# Patient Record
Sex: Female | Born: 1980 | Race: Black or African American | Hispanic: No | Marital: Married | Smoking: Never smoker
Health system: Southern US, Community
[De-identification: ages and names within clinical notes are randomized; demographics above are authoritative.]

## PROBLEM LIST (undated history)

## (undated) DIAGNOSIS — R5383 Other fatigue: Secondary | ICD-10-CM

## (undated) DIAGNOSIS — D693 Immune thrombocytopenic purpura: Secondary | ICD-10-CM

## (undated) DIAGNOSIS — Z23 Encounter for immunization: Secondary | ICD-10-CM

## (undated) HISTORY — DX: Other fatigue: R53.83

## (undated) HISTORY — DX: Immune thrombocytopenic purpura: D69.3

## (undated) HISTORY — DX: Encounter for immunization: Z23

---

## 2003-06-23 ENCOUNTER — Encounter: Payer: Self-pay | Admitting: Gastroenterology

## 2003-06-23 ENCOUNTER — Encounter: Admission: RE | Admit: 2003-06-23 | Discharge: 2003-06-23 | Payer: Self-pay | Admitting: Gastroenterology

## 2004-01-13 ENCOUNTER — Encounter: Admission: RE | Admit: 2004-01-13 | Discharge: 2004-01-13 | Payer: Self-pay | Admitting: Family Medicine

## 2004-10-07 ENCOUNTER — Inpatient Hospital Stay (HOSPITAL_COMMUNITY): Admission: EM | Admit: 2004-10-07 | Discharge: 2004-10-11 | Payer: Self-pay | Admitting: Emergency Medicine

## 2004-10-07 ENCOUNTER — Ambulatory Visit: Payer: Self-pay | Admitting: Oncology

## 2004-10-10 ENCOUNTER — Ambulatory Visit: Payer: Self-pay | Admitting: Oncology

## 2004-11-17 ENCOUNTER — Ambulatory Visit (HOSPITAL_COMMUNITY): Admission: RE | Admit: 2004-11-17 | Discharge: 2004-11-17 | Payer: Self-pay | Admitting: Oncology

## 2004-11-27 HISTORY — PX: OTHER SURGICAL HISTORY: SHX169

## 2004-11-30 ENCOUNTER — Ambulatory Visit: Payer: Self-pay | Admitting: Oncology

## 2004-12-14 ENCOUNTER — Ambulatory Visit: Admission: RE | Admit: 2004-12-14 | Discharge: 2004-12-14 | Payer: Self-pay | Admitting: *Deleted

## 2005-01-17 ENCOUNTER — Ambulatory Visit: Payer: Self-pay | Admitting: Oncology

## 2005-02-20 ENCOUNTER — Encounter (INDEPENDENT_AMBULATORY_CARE_PROVIDER_SITE_OTHER): Payer: Self-pay | Admitting: *Deleted

## 2005-02-20 ENCOUNTER — Observation Stay (HOSPITAL_COMMUNITY): Admission: RE | Admit: 2005-02-20 | Discharge: 2005-02-21 | Payer: Self-pay | Admitting: *Deleted

## 2005-03-06 ENCOUNTER — Ambulatory Visit: Payer: Self-pay | Admitting: Oncology

## 2005-03-16 ENCOUNTER — Observation Stay (HOSPITAL_COMMUNITY): Admission: AD | Admit: 2005-03-16 | Discharge: 2005-03-17 | Payer: Self-pay | Admitting: Oncology

## 2005-03-17 ENCOUNTER — Ambulatory Visit: Payer: Self-pay | Admitting: Oncology

## 2005-03-21 ENCOUNTER — Inpatient Hospital Stay (HOSPITAL_COMMUNITY): Admission: EM | Admit: 2005-03-21 | Discharge: 2005-03-24 | Payer: Self-pay | Admitting: Oncology

## 2005-04-27 ENCOUNTER — Ambulatory Visit: Payer: Self-pay | Admitting: Oncology

## 2005-06-14 ENCOUNTER — Ambulatory Visit: Payer: Self-pay | Admitting: Oncology

## 2005-08-01 ENCOUNTER — Ambulatory Visit: Payer: Self-pay | Admitting: Oncology

## 2005-08-17 ENCOUNTER — Observation Stay (HOSPITAL_COMMUNITY): Admission: EM | Admit: 2005-08-17 | Discharge: 2005-08-18 | Payer: Self-pay | Admitting: Oncology

## 2005-08-18 ENCOUNTER — Ambulatory Visit: Payer: Self-pay | Admitting: Oncology

## 2005-08-30 ENCOUNTER — Ambulatory Visit (HOSPITAL_COMMUNITY): Admission: RE | Admit: 2005-08-30 | Discharge: 2005-08-30 | Payer: Self-pay | Admitting: Oncology

## 2005-09-19 ENCOUNTER — Ambulatory Visit: Payer: Self-pay | Admitting: Oncology

## 2005-11-07 ENCOUNTER — Ambulatory Visit: Payer: Self-pay | Admitting: Oncology

## 2005-11-16 ENCOUNTER — Emergency Department (HOSPITAL_COMMUNITY): Admission: EM | Admit: 2005-11-16 | Discharge: 2005-11-16 | Payer: Self-pay | Admitting: Emergency Medicine

## 2005-12-27 ENCOUNTER — Ambulatory Visit: Payer: Self-pay | Admitting: Oncology

## 2006-02-12 ENCOUNTER — Ambulatory Visit: Payer: Self-pay | Admitting: Oncology

## 2006-03-01 LAB — CBC WITH DIFFERENTIAL/PLATELET
BASO%: 1.1 % (ref 0.0–2.0)
Eosinophils Absolute: 0.1 10*3/uL (ref 0.0–0.5)
HCT: 39.9 % (ref 34.8–46.6)
HGB: 13.3 g/dL (ref 11.6–15.9)
MCHC: 33.3 g/dL (ref 32.0–36.0)
MONO#: 1.1 10*3/uL — ABNORMAL HIGH (ref 0.1–0.9)
NEUT#: 3.8 10*3/uL (ref 1.5–6.5)
Platelets: 115 10*3/uL — ABNORMAL LOW (ref 145–400)
RBC: 4.81 10*6/uL (ref 3.70–5.32)
WBC: 6.6 10*3/uL (ref 3.9–10.0)
lymph#: 1.5 10*3/uL (ref 0.9–3.3)

## 2006-03-01 LAB — COMPREHENSIVE METABOLIC PANEL
ALT: <8 U/L (ref 0–40)
Albumin: 3.8 g/dL (ref 3.5–5.2)
CO2: 26 mEq/L (ref 19–32)
Calcium: 8.7 mg/dL (ref 8.4–10.5)
Chloride: 106 mEq/L (ref 96–112)
Glucose, Bld: 86 mg/dL (ref 70–99)
Sodium: 139 mEq/L (ref 135–145)
Total Protein: 6.5 g/dL (ref 6.0–8.3)

## 2006-03-06 LAB — URINALYSIS, MICROSCOPIC - CHCC
Bilirubin (Urine): NEGATIVE
Glucose: NEGATIVE g/dL
Ketones: NEGATIVE mg/dL
Leukocyte Esterase: NEGATIVE
Nitrite: NEGATIVE
Protein: NEGATIVE mg/dL
Specific Gravity, Urine: 1.03 (ref 1.003–1.035)
pH: 5 (ref 4.6–8.0)

## 2006-03-06 LAB — CBC WITH DIFFERENTIAL/PLATELET
BASO%: 1.1 % (ref 0.0–2.0)
LYMPH%: 20.3 % (ref 14.0–48.0)
MCHC: 33.1 g/dL (ref 32.0–36.0)
MONO#: 1.1 10*3/uL — ABNORMAL HIGH (ref 0.1–0.9)
NEUT#: 6 10*3/uL (ref 1.5–6.5)
RBC: 5.28 10*6/uL (ref 3.70–5.32)
RDW: 14.8 % — ABNORMAL HIGH (ref 11.3–14.5)
WBC: 9.3 10*3/uL (ref 3.9–10.0)
lymph#: 1.9 10*3/uL (ref 0.9–3.3)

## 2006-03-22 LAB — CBC WITH DIFFERENTIAL/PLATELET
Basophils Absolute: 0.1 10*3/uL (ref 0.0–0.1)
Eosinophils Absolute: 0.2 10*3/uL (ref 0.0–0.5)
HCT: 41.8 % (ref 34.8–46.6)
LYMPH%: 24 % (ref 14.0–48.0)
MCV: 83.2 fL (ref 81.0–101.0)
MONO%: 16.5 % — ABNORMAL HIGH (ref 0.0–13.0)
NEUT#: 3.6 10*3/uL (ref 1.5–6.5)
NEUT%: 55.7 % (ref 39.6–76.8)
Platelets: 195 10*3/uL (ref 145–400)
RBC: 5.02 10*6/uL (ref 3.70–5.32)

## 2006-03-27 LAB — CBC WITH DIFFERENTIAL/PLATELET
BASO%: 0.7 % (ref 0.0–2.0)
Basophils Absolute: 0.1 10*3/uL (ref 0.0–0.1)
EOS%: 2 % (ref 0.0–7.0)
HCT: 42.7 % (ref 34.8–46.6)
HGB: 14.1 g/dL (ref 11.6–15.9)
LYMPH%: 24.3 % (ref 14.0–48.0)
MCH: 27.5 pg (ref 26.0–34.0)
MCHC: 32.9 g/dL (ref 32.0–36.0)
MCV: 83.7 fL (ref 81.0–101.0)
MONO%: 14.7 % — ABNORMAL HIGH (ref 0.0–13.0)
NEUT%: 58.3 % (ref 39.6–76.8)
Platelets: 90 10*3/uL — ABNORMAL LOW (ref 145–400)

## 2006-04-03 ENCOUNTER — Ambulatory Visit: Payer: Self-pay | Admitting: Oncology

## 2006-04-05 LAB — CBC WITH DIFFERENTIAL/PLATELET
EOS%: 4.2 % (ref 0.0–7.0)
Eosinophils Absolute: 0.4 10*3/uL (ref 0.0–0.5)
LYMPH%: 27.7 % (ref 14.0–48.0)
MCH: 27.2 pg (ref 26.0–34.0)
MCV: 83.2 fL (ref 81.0–101.0)
MONO%: 9.9 % (ref 0.0–13.0)
NEUT#: 5.4 10*3/uL (ref 1.5–6.5)
Platelets: 155 10*3/uL (ref 145–400)
RBC: 5.05 10*6/uL (ref 3.70–5.32)
RDW: 14.8 % — ABNORMAL HIGH (ref 11.3–14.5)

## 2006-04-12 ENCOUNTER — Ambulatory Visit (HOSPITAL_COMMUNITY): Admission: RE | Admit: 2006-04-12 | Discharge: 2006-04-12 | Payer: Self-pay | Admitting: Oncology

## 2006-04-19 LAB — COMPREHENSIVE METABOLIC PANEL
ALT: <8 U/L (ref 0–40)
CO2: 26 mEq/L (ref 19–32)
Calcium: 9.2 mg/dL (ref 8.4–10.5)
Chloride: 101 mEq/L (ref 96–112)
Creatinine, Ser: 0.9 mg/dL (ref 0.4–1.2)
Glucose, Bld: 84 mg/dL (ref 70–99)
Total Bilirubin: 0.5 mg/dL (ref 0.3–1.2)

## 2006-04-19 LAB — CBC WITH DIFFERENTIAL/PLATELET
BASO%: 0.6 % (ref 0.0–2.0)
Basophils Absolute: 0 10*3/uL (ref 0.0–0.1)
Eosinophils Absolute: 0.2 10*3/uL (ref 0.0–0.5)
HCT: 43.5 % (ref 34.8–46.6)
HGB: 14.2 g/dL (ref 11.6–15.9)
LYMPH%: 17.4 % (ref 14.0–48.0)
MCHC: 32.7 g/dL (ref 32.0–36.0)
MONO#: 0.9 10*3/uL (ref 0.1–0.9)
NEUT#: 5.2 10*3/uL (ref 1.5–6.5)
NEUT%: 67.8 % (ref 39.6–76.8)
Platelets: 150 10*3/uL (ref 145–400)
WBC: 7.7 10*3/uL (ref 3.9–10.0)
lymph#: 1.3 10*3/uL (ref 0.9–3.3)

## 2006-05-03 LAB — CBC WITH DIFFERENTIAL/PLATELET
Basophils Absolute: 0 10*3/uL (ref 0.0–0.1)
EOS%: 9 % — ABNORMAL HIGH (ref 0.0–7.0)
HCT: 40.2 % (ref 34.8–46.6)
HGB: 13.3 g/dL (ref 11.6–15.9)
LYMPH%: 25.6 % (ref 14.0–48.0)
MCH: 27.6 pg (ref 26.0–34.0)
MCV: 83.4 fL (ref 81.0–101.0)
NEUT%: 52.2 % (ref 39.6–76.8)
Platelets: 128 10*3/uL — ABNORMAL LOW (ref 145–400)
lymph#: 2 10*3/uL (ref 0.9–3.3)

## 2006-05-03 LAB — COMPREHENSIVE METABOLIC PANEL
ALT: <8 U/L (ref 0–40)
BUN: 7 mg/dL (ref 6–23)
CO2: 23 mEq/L (ref 19–32)
Creatinine, Ser: 0.9 mg/dL (ref 0.40–1.20)
Total Bilirubin: 0.7 mg/dL (ref 0.3–1.2)

## 2006-05-17 ENCOUNTER — Ambulatory Visit: Payer: Self-pay | Admitting: Oncology

## 2006-05-17 LAB — CBC WITH DIFFERENTIAL/PLATELET
BASO%: 1.2 % (ref 0.0–2.0)
Basophils Absolute: 0.1 10*3/uL (ref 0.0–0.1)
Eosinophils Absolute: 0.4 10*3/uL (ref 0.0–0.5)
HCT: 40.5 % (ref 34.8–46.6)
HGB: 13.3 g/dL (ref 11.6–15.9)
LYMPH%: 19.7 % (ref 14.0–48.0)
MCHC: 33 g/dL (ref 32.0–36.0)
MONO#: 1.1 10*3/uL — ABNORMAL HIGH (ref 0.1–0.9)
NEUT%: 57.9 % (ref 39.6–76.8)
Platelets: 190 10*3/uL (ref 145–400)
WBC: 6.8 10*3/uL (ref 3.9–10.0)
lymph#: 1.3 10*3/uL (ref 0.9–3.3)

## 2006-05-31 LAB — COMPREHENSIVE METABOLIC PANEL
BUN: 10 mg/dL (ref 6–23)
CO2: 25 mEq/L (ref 19–32)
Calcium: 8.9 mg/dL (ref 8.4–10.5)
Chloride: 104 mEq/L (ref 96–112)
Creatinine, Ser: 0.87 mg/dL (ref 0.40–1.20)
Total Bilirubin: 0.4 mg/dL (ref 0.3–1.2)

## 2006-05-31 LAB — CBC WITH DIFFERENTIAL/PLATELET
BASO%: 0.8 % (ref 0.0–2.0)
Basophils Absolute: 0.1 10*3/uL (ref 0.0–0.1)
EOS%: 3.7 % (ref 0.0–7.0)
HCT: 40.6 % (ref 34.8–46.6)
HGB: 13.3 g/dL (ref 11.6–15.9)
LYMPH%: 17.6 % (ref 14.0–48.0)
MCH: 27.5 pg (ref 26.0–34.0)
MCHC: 32.9 g/dL (ref 32.0–36.0)
MONO#: 1 10*3/uL — ABNORMAL HIGH (ref 0.1–0.9)
NEUT%: 67.2 % (ref 39.6–76.8)
Platelets: 160 10*3/uL (ref 145–400)
lymph#: 1.7 10*3/uL (ref 0.9–3.3)

## 2006-06-14 LAB — CBC WITH DIFFERENTIAL/PLATELET
BASO%: 0.7 % (ref 0.0–2.0)
Basophils Absolute: 0 10*3/uL (ref 0.0–0.1)
Eosinophils Absolute: 0.2 10*3/uL (ref 0.0–0.5)
HCT: 41.5 % (ref 34.8–46.6)
HGB: 13.7 g/dL (ref 11.6–15.9)
LYMPH%: 23 % (ref 14.0–48.0)
MONO#: 0.9 10*3/uL (ref 0.1–0.9)
NEUT%: 60.3 % (ref 39.6–76.8)
Platelets: 165 10*3/uL (ref 145–400)
WBC: 6.8 10*3/uL (ref 3.9–10.0)
lymph#: 1.6 10*3/uL (ref 0.9–3.3)

## 2006-06-27 ENCOUNTER — Ambulatory Visit: Payer: Self-pay | Admitting: Oncology

## 2006-07-12 LAB — CBC WITH DIFFERENTIAL/PLATELET
BASO%: 0.5 % (ref 0.0–2.0)
Basophils Absolute: 0 10*3/uL (ref 0.0–0.1)
EOS%: 3.4 % (ref 0.0–7.0)
HGB: 14.3 g/dL (ref 11.6–15.9)
MCH: 28 pg (ref 26.0–34.0)
MCHC: 33.1 g/dL (ref 32.0–36.0)
MCV: 84.5 fL (ref 81.0–101.0)
MONO%: 7.9 % (ref 0.0–13.0)
NEUT%: 66.2 % (ref 39.6–76.8)
RDW: 15.2 % — ABNORMAL HIGH (ref 11.3–14.5)
lymph#: 1.6 10*3/uL (ref 0.9–3.3)

## 2006-07-26 LAB — CBC WITH DIFFERENTIAL/PLATELET
BASO%: 0.5 % (ref 0.0–2.0)
EOS%: 3.2 % (ref 0.0–7.0)
MCH: 27.9 pg (ref 26.0–34.0)
MCHC: 33.3 g/dL (ref 32.0–36.0)
NEUT%: 63.8 % (ref 39.6–76.8)
RBC: 4.82 10*6/uL (ref 3.70–5.32)
RDW: 15.4 % — ABNORMAL HIGH (ref 11.3–14.5)
lymph#: 2 10*3/uL (ref 0.9–3.3)

## 2006-08-02 LAB — CBC WITH DIFFERENTIAL/PLATELET
Basophils Absolute: 0 10*3/uL (ref 0.0–0.1)
EOS%: 3 % (ref 0.0–7.0)
HGB: 14.4 g/dL (ref 11.6–15.9)
MCH: 28 pg (ref 26.0–34.0)
NEUT#: 3.6 10*3/uL (ref 1.5–6.5)
RDW: 15.1 % — ABNORMAL HIGH (ref 11.3–14.5)
lymph#: 2.2 10*3/uL (ref 0.9–3.3)

## 2006-08-07 LAB — CBC WITH DIFFERENTIAL/PLATELET
BASO%: 2.2 % — ABNORMAL HIGH (ref 0.0–2.0)
Basophils Absolute: 0.2 10*3/uL — ABNORMAL HIGH (ref 0.0–0.1)
EOS%: 2.4 % (ref 0.0–7.0)
Eosinophils Absolute: 0.2 10*3/uL (ref 0.0–0.5)
HCT: 43.8 % (ref 34.8–46.6)
HGB: 14.6 g/dL (ref 11.6–15.9)
LYMPH%: 24.2 % (ref 14.0–48.0)
MCH: 28.1 pg (ref 26.0–34.0)
MCHC: 33.2 g/dL (ref 32.0–36.0)
MCV: 84.6 fL (ref 81.0–101.0)
MONO#: 1 10*3/uL — ABNORMAL HIGH (ref 0.1–0.9)
MONO%: 12 % (ref 0.0–13.0)
NEUT#: 4.8 10*3/uL (ref 1.5–6.5)
NEUT%: 59.2 % (ref 39.6–76.8)
Platelets: 119 10*3/uL — ABNORMAL LOW (ref 145–400)
RBC: 5.17 10*6/uL (ref 3.70–5.32)
RDW: 15 % — ABNORMAL HIGH (ref 11.3–14.5)
WBC: 8.1 10*3/uL (ref 3.9–10.0)
lymph#: 2 10*3/uL (ref 0.9–3.3)

## 2006-08-14 ENCOUNTER — Ambulatory Visit: Payer: Self-pay | Admitting: Oncology

## 2006-08-16 LAB — CBC WITH DIFFERENTIAL/PLATELET
Basophils Absolute: 0 10*3/uL (ref 0.0–0.1)
Eosinophils Absolute: 0.2 10*3/uL (ref 0.0–0.5)
HCT: 40.8 % (ref 34.8–46.6)
HGB: 13.5 g/dL (ref 11.6–15.9)
LYMPH%: 24.6 % (ref 14.0–48.0)
MCV: 84.9 fL (ref 81.0–101.0)
MONO%: 7 % (ref 0.0–13.0)
NEUT#: 5.6 10*3/uL (ref 1.5–6.5)
Platelets: 44 10*3/uL — ABNORMAL LOW (ref 145–400)

## 2006-08-23 LAB — CBC WITH DIFFERENTIAL/PLATELET
Basophils Absolute: 0.1 10*3/uL (ref 0.0–0.1)
Eosinophils Absolute: 0.2 10*3/uL (ref 0.0–0.5)
HCT: 41.9 % (ref 34.8–46.6)
HGB: 13.8 g/dL (ref 11.6–15.9)
LYMPH%: 26.1 % (ref 14.0–48.0)
MCV: 83.9 fL (ref 81.0–101.0)
MONO#: 1 10*3/uL — ABNORMAL HIGH (ref 0.1–0.9)
MONO%: 10.1 % (ref 0.0–13.0)
NEUT#: 6.2 10*3/uL (ref 1.5–6.5)
Platelets: 55 10*3/uL — ABNORMAL LOW (ref 145–400)
WBC: 10.3 10*3/uL — ABNORMAL HIGH (ref 3.9–10.0)

## 2006-08-23 LAB — URINALYSIS, MICROSCOPIC - CHCC
Blood: NEGATIVE
Leukocyte Esterase: NEGATIVE
Nitrite: NEGATIVE
Specific Gravity, Urine: 1.02 (ref 1.003–1.035)
pH: 6.5 (ref 4.6–8.0)

## 2006-08-25 LAB — URINE CULTURE

## 2006-09-06 LAB — CBC WITH DIFFERENTIAL/PLATELET
Basophils Absolute: 0.1 10*3/uL (ref 0.0–0.1)
Eosinophils Absolute: 0.2 10*3/uL (ref 0.0–0.5)
HCT: 39.4 % (ref 34.8–46.6)
HGB: 13.5 g/dL (ref 11.6–15.9)
LYMPH%: 28.2 % (ref 14.0–48.0)
MCHC: 34.3 g/dL (ref 32.0–36.0)
MONO#: 0.8 10*3/uL (ref 0.1–0.9)
NEUT#: 4 10*3/uL (ref 1.5–6.5)
NEUT%: 55.8 % (ref 39.6–76.8)
Platelets: 64 10*3/uL — ABNORMAL LOW (ref 145–400)
WBC: 7.1 10*3/uL (ref 3.9–10.0)
lymph#: 2 10*3/uL (ref 0.9–3.3)

## 2006-09-25 LAB — CBC WITH DIFFERENTIAL/PLATELET
BASO%: 2.4 % — ABNORMAL HIGH (ref 0.0–2.0)
Basophils Absolute: 0.2 10*3/uL — ABNORMAL HIGH (ref 0.0–0.1)
EOS%: 1.1 % (ref 0.0–7.0)
HCT: 42.2 % (ref 34.8–46.6)
HGB: 14.1 g/dL (ref 11.6–15.9)
LYMPH%: 19 % (ref 14.0–48.0)
MCH: 28.1 pg (ref 26.0–34.0)
MCHC: 33.4 g/dL (ref 32.0–36.0)
MCV: 84 fL (ref 81.0–101.0)
NEUT%: 67.3 % (ref 39.6–76.8)
Platelets: 50 10*3/uL — ABNORMAL LOW (ref 145–400)

## 2006-09-28 ENCOUNTER — Ambulatory Visit: Payer: Self-pay | Admitting: Oncology

## 2006-10-02 LAB — CBC WITH DIFFERENTIAL/PLATELET
Basophils Absolute: 0.2 10*3/uL — ABNORMAL HIGH (ref 0.0–0.1)
EOS%: 1.9 % (ref 0.0–7.0)
HCT: 41.2 % (ref 34.8–46.6)
HGB: 13.5 g/dL (ref 11.6–15.9)
MCH: 27.6 pg (ref 26.0–34.0)
MCV: 84.1 fL (ref 81.0–101.0)
NEUT%: 52.8 % (ref 39.6–76.8)
Platelets: 152 10*3/uL (ref 145–400)
lymph#: 1.9 10*3/uL (ref 0.9–3.3)

## 2006-10-09 LAB — CBC WITH DIFFERENTIAL/PLATELET
BASO%: 1.8 % (ref 0.0–2.0)
Basophils Absolute: 0.1 10*3/uL (ref 0.0–0.1)
Eosinophils Absolute: 0.2 10*3/uL (ref 0.0–0.5)
HCT: 40.3 % (ref 34.8–46.6)
HGB: 13.4 g/dL (ref 11.6–15.9)
LYMPH%: 24.8 % (ref 14.0–48.0)
MONO#: 1 10*3/uL — ABNORMAL HIGH (ref 0.1–0.9)
NEUT#: 4.8 10*3/uL (ref 1.5–6.5)
NEUT%: 58.9 % (ref 39.6–76.8)
Platelets: 59 10*3/uL — ABNORMAL LOW (ref 145–400)
WBC: 8.2 10*3/uL (ref 3.9–10.0)
lymph#: 2 10*3/uL (ref 0.9–3.3)

## 2006-10-16 LAB — CBC WITH DIFFERENTIAL/PLATELET
Basophils Absolute: 0.2 10*3/uL — ABNORMAL HIGH (ref 0.0–0.1)
EOS%: 4.1 % (ref 0.0–7.0)
HCT: 41.7 % (ref 34.8–46.6)
HGB: 13.2 g/dL (ref 11.6–15.9)
LYMPH%: 22.2 % (ref 14.0–48.0)
MCH: 27.4 pg (ref 26.0–34.0)
MCHC: 31.7 g/dL — ABNORMAL LOW (ref 32.0–36.0)
MCV: 86.3 fL (ref 81.0–101.0)
NEUT%: 60.1 % (ref 39.6–76.8)
Platelets: 37 10*3/uL — ABNORMAL LOW (ref 145–400)
lymph#: 2.5 10*3/uL (ref 0.9–3.3)

## 2006-10-22 LAB — CBC WITH DIFFERENTIAL/PLATELET
Basophils Absolute: 0.1 10*3/uL (ref 0.0–0.1)
HCT: 42.7 % (ref 34.8–46.6)
HGB: 14.1 g/dL (ref 11.6–15.9)
MCH: 28.2 pg (ref 26.0–34.0)
MCHC: 33.1 g/dL (ref 32.0–36.0)
MCV: 85.1 fL (ref 81.0–101.0)
MONO%: 10.8 % (ref 0.0–13.0)
NEUT%: 55.4 % (ref 39.6–76.8)
RDW: 14.6 % — ABNORMAL HIGH (ref 11.3–14.5)

## 2006-10-30 LAB — CBC WITH DIFFERENTIAL/PLATELET
BASO%: 1 % (ref 0.0–2.0)
HCT: 41.5 % (ref 34.8–46.6)
LYMPH%: 22.8 % (ref 14.0–48.0)
MCHC: 33.1 g/dL (ref 32.0–36.0)
MCV: 85.7 fL (ref 81.0–101.0)
MONO%: 16.3 % — ABNORMAL HIGH (ref 0.0–13.0)
NEUT%: 57.1 % (ref 39.6–76.8)
Platelets: 179 10*3/uL (ref 145–400)
RBC: 4.84 10*6/uL (ref 3.70–5.32)

## 2006-11-06 LAB — CBC WITH DIFFERENTIAL/PLATELET
EOS%: 4.3 % (ref 0.0–7.0)
Eosinophils Absolute: 0.3 10*3/uL (ref 0.0–0.5)
LYMPH%: 24.8 % (ref 14.0–48.0)
MCH: 28.5 pg (ref 26.0–34.0)
MCHC: 33.4 g/dL (ref 32.0–36.0)
MCV: 85.2 fL (ref 81.0–101.0)
MONO%: 13.2 % — ABNORMAL HIGH (ref 0.0–13.0)
NEUT#: 4.2 10*3/uL (ref 1.5–6.5)
Platelets: 28 10*3/uL — ABNORMAL LOW (ref 145–400)
RBC: 4.93 10*6/uL (ref 3.70–5.32)
RDW: 14.6 % — ABNORMAL HIGH (ref 11.3–14.5)

## 2006-11-08 LAB — CBC WITH DIFFERENTIAL/PLATELET
BASO%: 0.5 % (ref 0.0–2.0)
LYMPH%: 25.7 % (ref 14.0–48.0)
MCHC: 33 g/dL (ref 32.0–36.0)
MONO#: 0.8 10*3/uL (ref 0.1–0.9)
MONO%: 11.4 % (ref 0.0–13.0)
NEUT#: 4 10*3/uL (ref 1.5–6.5)
Platelets: 34 10*3/uL — ABNORMAL LOW (ref 145–400)
RBC: 4.96 10*6/uL (ref 3.70–5.32)
RDW: 15 % — ABNORMAL HIGH (ref 11.3–14.5)
WBC: 7 10*3/uL (ref 3.9–10.0)

## 2006-11-12 LAB — CBC WITH DIFFERENTIAL/PLATELET
BASO%: 0.4 % (ref 0.0–2.0)
Eosinophils Absolute: 0.3 10*3/uL (ref 0.0–0.5)
HCT: 41.3 % (ref 34.8–46.6)
MCHC: 33.2 g/dL (ref 32.0–36.0)
MONO#: 0.9 10*3/uL (ref 0.1–0.9)
NEUT#: 5.5 10*3/uL (ref 1.5–6.5)
RBC: 4.84 10*6/uL (ref 3.70–5.32)
WBC: 8.3 10*3/uL (ref 3.9–10.0)
lymph#: 1.6 10*3/uL (ref 0.9–3.3)

## 2006-11-19 ENCOUNTER — Ambulatory Visit: Payer: Self-pay | Admitting: Oncology

## 2006-11-23 LAB — CBC WITH DIFFERENTIAL/PLATELET
BASO%: 0.4 % (ref 0.0–2.0)
Basophils Absolute: 0 10*3/uL (ref 0.0–0.1)
Eosinophils Absolute: 0.5 10*3/uL (ref 0.0–0.5)
HCT: 44.5 % (ref 34.8–46.6)
HGB: 14.5 g/dL (ref 11.6–15.9)
MONO#: 1.1 10*3/uL — ABNORMAL HIGH (ref 0.1–0.9)
NEUT#: 4.5 10*3/uL (ref 1.5–6.5)
NEUT%: 51.1 % (ref 39.6–76.8)
WBC: 8.8 10*3/uL (ref 3.9–10.0)
lymph#: 2.7 10*3/uL (ref 0.9–3.3)

## 2006-12-10 LAB — COMPREHENSIVE METABOLIC PANEL
ALT: <8 U/L (ref 0–35)
CO2: 24 mEq/L (ref 19–32)
Calcium: 8.9 mg/dL (ref 8.4–10.5)
Chloride: 107 mEq/L (ref 96–112)
Creatinine, Ser: 0.86 mg/dL (ref 0.40–1.20)
Glucose, Bld: 85 mg/dL (ref 70–99)
Total Bilirubin: 0.5 mg/dL (ref 0.3–1.2)
Total Protein: 6.4 g/dL (ref 6.0–8.3)

## 2006-12-10 LAB — CBC WITH DIFFERENTIAL/PLATELET
BASO%: 0.5 % (ref 0.0–2.0)
EOS%: 3.8 % (ref 0.0–7.0)
HCT: 41.7 % (ref 34.8–46.6)
LYMPH%: 25.1 % (ref 14.0–48.0)
MCH: 27.8 pg (ref 26.0–34.0)
MCHC: 32.9 g/dL (ref 32.0–36.0)
MCV: 84.6 fL (ref 81.0–101.0)
MONO%: 13.5 % — ABNORMAL HIGH (ref 0.0–13.0)
NEUT%: 57.1 % (ref 39.6–76.8)
Platelets: 48 10*3/uL — ABNORMAL LOW (ref 145–400)
RBC: 4.93 10*6/uL (ref 3.70–5.32)
WBC: 8.2 10*3/uL (ref 3.9–10.0)

## 2006-12-10 LAB — MORPHOLOGY

## 2006-12-24 LAB — MORPHOLOGY: RBC Comments: NORMAL

## 2006-12-24 LAB — CBC WITH DIFFERENTIAL/PLATELET
BASO%: 0.1 % (ref 0.0–2.0)
EOS%: 3.6 % (ref 0.0–7.0)
HCT: 42.9 % (ref 34.8–46.6)
LYMPH%: 27.4 % (ref 14.0–48.0)
MCH: 28.4 pg (ref 26.0–34.0)
MCHC: 33 g/dL (ref 32.0–36.0)
MONO#: 0.8 10*3/uL (ref 0.1–0.9)
NEUT%: 56.1 % (ref 39.6–76.8)
Platelets: 156 10*3/uL (ref 145–400)
RBC: 5 10*6/uL (ref 3.70–5.32)
WBC: 6.2 10*3/uL (ref 3.9–10.0)
lymph#: 1.7 10*3/uL (ref 0.9–3.3)

## 2007-01-03 ENCOUNTER — Ambulatory Visit: Payer: Self-pay | Admitting: Oncology

## 2007-01-04 LAB — CBC WITH DIFFERENTIAL/PLATELET
BASO%: 0.7 % (ref 0.0–2.0)
EOS%: 2.9 % (ref 0.0–7.0)
HCT: 39 % (ref 34.8–46.6)
LYMPH%: 23.6 % (ref 14.0–48.0)
MCH: 29 pg (ref 26.0–34.0)
MCHC: 34.6 g/dL (ref 32.0–36.0)
MONO%: 9.7 % (ref 0.0–13.0)
NEUT%: 63.1 % (ref 39.6–76.8)
Platelets: 52 10*3/uL — ABNORMAL LOW (ref 145–400)
RBC: 4.67 10*6/uL (ref 3.70–5.32)
WBC: 9.5 10*3/uL (ref 3.9–10.0)

## 2007-01-07 LAB — CBC WITH DIFFERENTIAL/PLATELET
BASO%: 0.3 % (ref 0.0–2.0)
EOS%: 2.6 % (ref 0.0–7.0)
MCH: 28.8 pg (ref 26.0–34.0)
MCHC: 34.3 g/dL (ref 32.0–36.0)
MONO#: 0.6 10*3/uL (ref 0.1–0.9)
NEUT%: 68.7 % (ref 39.6–76.8)
RBC: 4.83 10*6/uL (ref 3.70–5.32)
RDW: 14.5 % (ref 11.3–14.5)
WBC: 7.9 10*3/uL (ref 3.9–10.0)
lymph#: 1.6 10*3/uL (ref 0.9–3.3)

## 2007-01-07 LAB — MORPHOLOGY: PLT EST: DECREASED

## 2007-01-09 LAB — CBC WITH DIFFERENTIAL/PLATELET
BASO%: 0.2 % (ref 0.0–2.0)
Basophils Absolute: 0 10*3/uL (ref 0.0–0.1)
EOS%: 2.5 % (ref 0.0–7.0)
HGB: 13.6 g/dL (ref 11.6–15.9)
MCH: 28.7 pg (ref 26.0–34.0)
MCHC: 33.9 g/dL (ref 32.0–36.0)
MCV: 84.6 fL (ref 81.0–101.0)
MONO%: 5.8 % (ref 0.0–13.0)
NEUT%: 74.1 % (ref 39.6–76.8)
RDW: 14.7 % — ABNORMAL HIGH (ref 11.3–14.5)

## 2007-01-21 LAB — COMPREHENSIVE METABOLIC PANEL
ALT: <8 U/L (ref 0–35)
AST: 12 U/L (ref 0–37)
Alkaline Phosphatase: 80 U/L (ref 39–117)
Sodium: 141 mEq/L (ref 135–145)
Total Bilirubin: 0.5 mg/dL (ref 0.3–1.2)
Total Protein: 6.9 g/dL (ref 6.0–8.3)

## 2007-01-21 LAB — CBC WITH DIFFERENTIAL/PLATELET
EOS%: 4.5 % (ref 0.0–7.0)
MCH: 28.4 pg (ref 26.0–34.0)
MCV: 84 fL (ref 81.0–101.0)
MONO%: 14.6 % — ABNORMAL HIGH (ref 0.0–13.0)
NEUT#: 3.4 10*3/uL (ref 1.5–6.5)
RBC: 5.1 10*6/uL (ref 3.70–5.32)
RDW: 15.1 % — ABNORMAL HIGH (ref 11.3–14.5)
lymph#: 1.8 10*3/uL (ref 0.9–3.3)

## 2007-01-21 LAB — TECHNOLOGIST REVIEW

## 2007-02-04 LAB — CBC WITH DIFFERENTIAL/PLATELET
BASO%: 0.5 % (ref 0.0–2.0)
EOS%: 3.3 % (ref 0.0–7.0)
HCT: 38.6 % (ref 34.8–46.6)
LYMPH%: 20.8 % (ref 14.0–48.0)
MCH: 28.2 pg (ref 26.0–34.0)
MCHC: 33.3 g/dL (ref 32.0–36.0)
MCV: 84.5 fL (ref 81.0–101.0)
MONO#: 0.7 10*3/uL (ref 0.1–0.9)
NEUT%: 66.1 % (ref 39.6–76.8)
Platelets: 42 10*3/uL — ABNORMAL LOW (ref 145–400)

## 2007-02-11 LAB — CBC WITH DIFFERENTIAL/PLATELET
BASO%: 0.8 % (ref 0.0–2.0)
EOS%: 2.7 % (ref 0.0–7.0)
HCT: 41.3 % (ref 34.8–46.6)
LYMPH%: 22 % (ref 14.0–48.0)
MCH: 28.7 pg (ref 26.0–34.0)
MCHC: 34 g/dL (ref 32.0–36.0)
MCV: 84.3 fL (ref 81.0–101.0)
MONO#: 1.1 10*3/uL — ABNORMAL HIGH (ref 0.1–0.9)
MONO%: 12 % (ref 0.0–13.0)
NEUT%: 62.5 % (ref 39.6–76.8)
Platelets: 72 10*3/uL — ABNORMAL LOW (ref 145–400)

## 2007-02-13 LAB — CBC WITH DIFFERENTIAL/PLATELET
BASO%: 1.4 % (ref 0.0–2.0)
EOS%: 3 % (ref 0.0–7.0)
HCT: 40.8 % (ref 34.8–46.6)
LYMPH%: 28.9 % (ref 14.0–48.0)
MCH: 28.4 pg (ref 26.0–34.0)
MCHC: 33.6 g/dL (ref 32.0–36.0)
NEUT%: 56.1 % (ref 39.6–76.8)
RBC: 4.82 10*6/uL (ref 3.70–5.32)
lymph#: 2.7 10*3/uL (ref 0.9–3.3)

## 2007-02-15 ENCOUNTER — Ambulatory Visit: Payer: Self-pay | Admitting: Oncology

## 2007-02-19 LAB — CBC WITH DIFFERENTIAL/PLATELET
EOS%: 6.2 % (ref 0.0–7.0)
MCH: 27.7 pg (ref 26.0–34.0)
MCV: 86.3 fL (ref 81.0–101.0)
MONO%: 13.8 % — ABNORMAL HIGH (ref 0.0–13.0)
NEUT#: 3.6 10*3/uL (ref 1.5–6.5)
RBC: 5.05 10*6/uL (ref 3.70–5.32)
RDW: 12.5 % (ref 11.3–14.5)

## 2007-02-19 LAB — CHCC SMEAR

## 2007-03-05 LAB — CBC WITH DIFFERENTIAL/PLATELET
BASO%: 0.1 % (ref 0.0–2.0)
Eosinophils Absolute: 0.2 10*3/uL (ref 0.0–0.5)
HCT: 40 % (ref 34.8–46.6)
LYMPH%: 21.9 % (ref 14.0–48.0)
MCHC: 34 g/dL (ref 32.0–36.0)
MONO#: 0.7 10*3/uL (ref 0.1–0.9)
NEUT#: 7 10*3/uL — ABNORMAL HIGH (ref 1.5–6.5)
NEUT%: 69.1 % (ref 39.6–76.8)
Platelets: 182 10*3/uL (ref 145–400)
RBC: 4.71 10*6/uL (ref 3.70–5.32)
WBC: 10.1 10*3/uL — ABNORMAL HIGH (ref 3.9–10.0)
lymph#: 2.2 10*3/uL (ref 0.9–3.3)

## 2007-03-19 LAB — CBC WITH DIFFERENTIAL/PLATELET
Basophils Absolute: 0.1 10*3/uL (ref 0.0–0.1)
HCT: 40.9 % (ref 34.8–46.6)
HGB: 13.8 g/dL (ref 11.6–15.9)
LYMPH%: 35.3 % (ref 14.0–48.0)
MONO#: 0.9 10*3/uL (ref 0.1–0.9)
NEUT%: 48.3 % (ref 39.6–76.8)
Platelets: 81 10*3/uL — ABNORMAL LOW (ref 145–400)
WBC: 8 10*3/uL (ref 3.9–10.0)
lymph#: 2.8 10*3/uL (ref 0.9–3.3)

## 2007-03-25 LAB — CBC WITH DIFFERENTIAL/PLATELET
Basophils Absolute: 0.2 10*3/uL — ABNORMAL HIGH (ref 0.0–0.1)
EOS%: 2.6 % (ref 0.0–7.0)
HCT: 41.7 % (ref 34.8–46.6)
HGB: 13.6 g/dL (ref 11.6–15.9)
LYMPH%: 21.5 % (ref 14.0–48.0)
MCH: 27.9 pg (ref 26.0–34.0)
MCHC: 32.7 g/dL (ref 32.0–36.0)
MCV: 85.6 fL (ref 81.0–101.0)
MONO%: 9.2 % (ref 0.0–13.0)
NEUT%: 65.2 % (ref 39.6–76.8)
Platelets: 47 10*3/uL — ABNORMAL LOW (ref 145–400)
lymph#: 2.3 10*3/uL (ref 0.9–3.3)

## 2007-03-27 LAB — COMPREHENSIVE METABOLIC PANEL
Alkaline Phosphatase: 80 U/L (ref 39–117)
BUN: 9 mg/dL (ref 6–23)
CO2: 25 mEq/L (ref 19–32)
Creatinine, Ser: 0.83 mg/dL (ref 0.40–1.20)
Glucose, Bld: 75 mg/dL (ref 70–99)
Sodium: 141 mEq/L (ref 135–145)
Total Bilirubin: 0.6 mg/dL (ref 0.3–1.2)
Total Protein: 6.5 g/dL (ref 6.0–8.3)

## 2007-03-27 LAB — MORPHOLOGY

## 2007-03-27 LAB — CBC WITH DIFFERENTIAL/PLATELET
BASO%: 2 % (ref 0.0–2.0)
EOS%: 3.1 % (ref 0.0–7.0)
HCT: 42.1 % (ref 34.8–46.6)
HGB: 14.1 g/dL (ref 11.6–15.9)
MCHC: 33.4 g/dL (ref 32.0–36.0)
MONO#: 1 10*3/uL — ABNORMAL HIGH (ref 0.1–0.9)
NEUT%: 63 % (ref 39.6–76.8)
RDW: 12.2 % (ref 11.3–14.5)
WBC: 10.3 10*3/uL — ABNORMAL HIGH (ref 3.9–10.0)
lymph#: 2.3 10*3/uL (ref 0.9–3.3)

## 2007-04-02 ENCOUNTER — Ambulatory Visit: Payer: Self-pay | Admitting: Oncology

## 2007-04-02 LAB — CBC WITH DIFFERENTIAL/PLATELET
Basophils Absolute: 0 10*3/uL (ref 0.0–0.1)
Eosinophils Absolute: 0.5 10*3/uL (ref 0.0–0.5)
HCT: 39.2 % (ref 34.8–46.6)
HGB: 13.3 g/dL (ref 11.6–15.9)
LYMPH%: 32.6 % (ref 14.0–48.0)
MCV: 84.4 fL (ref 81.0–101.0)
MONO#: 0.6 10*3/uL (ref 0.1–0.9)
MONO%: 6.6 % (ref 0.0–13.0)
NEUT#: 5.3 10*3/uL (ref 1.5–6.5)
Platelets: 76 10*3/uL — ABNORMAL LOW (ref 145–400)
RBC: 4.65 10*6/uL (ref 3.70–5.32)
WBC: 9.6 10*3/uL (ref 3.9–10.0)

## 2007-04-02 LAB — MORPHOLOGY

## 2007-04-02 LAB — CHCC SMEAR

## 2007-04-16 LAB — CBC WITH DIFFERENTIAL/PLATELET
BASO%: 0.7 % (ref 0.0–2.0)
Basophils Absolute: 0.1 10*3/uL (ref 0.0–0.1)
EOS%: 11.9 % — ABNORMAL HIGH (ref 0.0–7.0)
HGB: 14.1 g/dL (ref 11.6–15.9)
MCH: 28.5 pg (ref 26.0–34.0)
MONO#: 1.1 10*3/uL — ABNORMAL HIGH (ref 0.1–0.9)
RDW: 14.8 % — ABNORMAL HIGH (ref 11.3–14.5)
WBC: 8 10*3/uL (ref 3.9–10.0)
lymph#: 2 10*3/uL (ref 0.9–3.3)

## 2007-04-16 LAB — MORPHOLOGY: PLT EST: DECREASED

## 2007-04-30 LAB — CBC WITH DIFFERENTIAL/PLATELET
BASO%: 0.5 % (ref 0.0–2.0)
LYMPH%: 20.9 % (ref 14.0–48.0)
MCHC: 33.9 g/dL (ref 32.0–36.0)
MCV: 84.3 fL (ref 81.0–101.0)
MONO%: 9.4 % (ref 0.0–13.0)
Platelets: 71 10*3/uL — ABNORMAL LOW (ref 145–400)
RBC: 4.81 10*6/uL (ref 3.70–5.32)
WBC: 10.3 10*3/uL — ABNORMAL HIGH (ref 3.9–10.0)

## 2007-05-14 LAB — MORPHOLOGY

## 2007-05-14 LAB — CBC WITH DIFFERENTIAL/PLATELET
BASO%: 3.2 % — ABNORMAL HIGH (ref 0.0–2.0)
LYMPH%: 30.1 % (ref 14.0–48.0)
MCHC: 33.9 g/dL (ref 32.0–36.0)
MONO#: 1.1 10*3/uL — ABNORMAL HIGH (ref 0.1–0.9)
NEUT#: 4.5 10*3/uL (ref 1.5–6.5)
Platelets: 161 10*3/uL (ref 145–400)
RBC: 4.9 10*6/uL (ref 3.70–5.32)
RDW: 14.3 % (ref 11.3–14.5)
WBC: 8.7 10*3/uL (ref 3.9–10.0)

## 2007-05-30 ENCOUNTER — Ambulatory Visit: Payer: Self-pay | Admitting: Oncology

## 2007-06-04 LAB — CBC WITH DIFFERENTIAL/PLATELET
BASO%: 0.6 % (ref 0.0–2.0)
Eosinophils Absolute: 0.4 10*3/uL (ref 0.0–0.5)
HCT: 41.4 % (ref 34.8–46.6)
MCHC: 33.7 g/dL (ref 32.0–36.0)
MONO#: 1.1 10*3/uL — ABNORMAL HIGH (ref 0.1–0.9)
NEUT#: 5.4 10*3/uL (ref 1.5–6.5)
NEUT%: 59.6 % (ref 39.6–76.8)
RBC: 4.92 10*6/uL (ref 3.70–5.32)
WBC: 9.1 10*3/uL (ref 3.9–10.0)
lymph#: 2.2 10*3/uL (ref 0.9–3.3)

## 2007-06-14 ENCOUNTER — Other Ambulatory Visit: Admission: RE | Admit: 2007-06-14 | Discharge: 2007-06-14 | Payer: Self-pay | Admitting: Family Medicine

## 2007-06-18 LAB — CBC WITH DIFFERENTIAL/PLATELET
Basophils Absolute: 0 10*3/uL (ref 0.0–0.1)
HCT: 41.1 % (ref 34.8–46.6)
HGB: 14.1 g/dL (ref 11.6–15.9)
MONO#: 0.5 10*3/uL (ref 0.1–0.9)
NEUT%: 70 % (ref 39.6–76.8)
WBC: 9.4 10*3/uL (ref 3.9–10.0)
lymph#: 2 10*3/uL (ref 0.9–3.3)

## 2007-06-18 LAB — COMPREHENSIVE METABOLIC PANEL
ALT: 10 U/L (ref 0–35)
BUN: 5 mg/dL — ABNORMAL LOW (ref 6–23)
CO2: 27 mEq/L (ref 19–32)
Calcium: 9.1 mg/dL (ref 8.4–10.5)
Chloride: 107 mEq/L (ref 96–112)
Creatinine, Ser: 0.76 mg/dL (ref 0.40–1.20)
Glucose, Bld: 85 mg/dL (ref 70–99)

## 2007-06-25 LAB — COMPREHENSIVE METABOLIC PANEL
ALT: 9 U/L (ref 0–35)
Albumin: 3.9 g/dL (ref 3.5–5.2)
CO2: 29 mEq/L (ref 19–32)
Calcium: 8.9 mg/dL (ref 8.4–10.5)
Chloride: 105 mEq/L (ref 96–112)
Creatinine, Ser: 0.84 mg/dL (ref 0.40–1.20)
Total Protein: 6.9 g/dL (ref 6.0–8.3)

## 2007-06-25 LAB — CBC WITH DIFFERENTIAL/PLATELET
BASO%: 0 % (ref 0.0–2.0)
HCT: 41 % (ref 34.8–46.6)
HGB: 13.8 g/dL (ref 11.6–15.9)
MCHC: 33.7 g/dL (ref 32.0–36.0)
MONO#: 0.9 10*3/uL (ref 0.1–0.9)
NEUT%: 51 % (ref 39.6–76.8)
WBC: 7.4 10*3/uL (ref 3.9–10.0)
lymph#: 2.4 10*3/uL (ref 0.9–3.3)

## 2007-07-21 ENCOUNTER — Ambulatory Visit: Payer: Self-pay | Admitting: Oncology

## 2007-07-23 LAB — CBC WITH DIFFERENTIAL/PLATELET
Basophils Absolute: 0 10*3/uL (ref 0.0–0.1)
Eosinophils Absolute: 0.3 10*3/uL (ref 0.0–0.5)
HCT: 41.6 % (ref 34.8–46.6)
HGB: 14 g/dL (ref 11.6–15.9)
MCH: 28.4 pg (ref 26.0–34.0)
NEUT#: 5.3 10*3/uL (ref 1.5–6.5)
NEUT%: 66.8 % (ref 39.6–76.8)
RDW: 14.7 % — ABNORMAL HIGH (ref 11.3–14.5)
lymph#: 1.5 10*3/uL (ref 0.9–3.3)

## 2007-07-23 LAB — COMPREHENSIVE METABOLIC PANEL
Albumin: 4 g/dL (ref 3.5–5.2)
BUN: 11 mg/dL (ref 6–23)
CO2: 26 mEq/L (ref 19–32)
Calcium: 9.1 mg/dL (ref 8.4–10.5)
Chloride: 106 mEq/L (ref 96–112)
Creatinine, Ser: 0.78 mg/dL (ref 0.40–1.20)
Glucose, Bld: 80 mg/dL (ref 70–99)
Potassium: 3.9 mEq/L (ref 3.5–5.3)

## 2007-08-27 LAB — CBC WITH DIFFERENTIAL/PLATELET
Basophils Absolute: 0.1 10*3/uL (ref 0.0–0.1)
EOS%: 3.1 % (ref 0.0–7.0)
Eosinophils Absolute: 0.2 10*3/uL (ref 0.0–0.5)
HCT: 40.8 % (ref 34.8–46.6)
HGB: 13.9 g/dL (ref 11.6–15.9)
MCH: 28.5 pg (ref 26.0–34.0)
MONO#: 0.6 10*3/uL (ref 0.1–0.9)
NEUT#: 4.4 10*3/uL (ref 1.5–6.5)
RDW: 14.9 % — ABNORMAL HIGH (ref 11.3–14.5)
WBC: 8 10*3/uL (ref 3.9–10.0)
lymph#: 2.7 10*3/uL (ref 0.9–3.3)

## 2007-08-27 LAB — COMPREHENSIVE METABOLIC PANEL
AST: 17 U/L (ref 0–37)
Albumin: 4.2 g/dL (ref 3.5–5.2)
BUN: 12 mg/dL (ref 6–23)
CO2: 27 mEq/L (ref 19–32)
Calcium: 9 mg/dL (ref 8.4–10.5)
Chloride: 102 mEq/L (ref 96–112)
Potassium: 3.5 mEq/L (ref 3.5–5.3)

## 2007-09-24 ENCOUNTER — Ambulatory Visit: Payer: Self-pay | Admitting: Oncology

## 2007-09-26 LAB — COMPREHENSIVE METABOLIC PANEL
ALT: 9 U/L (ref 0–35)
Albumin: 4.1 g/dL (ref 3.5–5.2)
CO2: 26 mEq/L (ref 19–32)
Calcium: 8.9 mg/dL (ref 8.4–10.5)
Chloride: 101 mEq/L (ref 96–112)
Potassium: 4.1 mEq/L (ref 3.5–5.3)
Sodium: 137 mEq/L (ref 135–145)
Total Bilirubin: 0.5 mg/dL (ref 0.3–1.2)
Total Protein: 7 g/dL (ref 6.0–8.3)

## 2007-09-26 LAB — CBC WITH DIFFERENTIAL/PLATELET
BASO%: 0.4 % (ref 0.0–2.0)
EOS%: 4 % (ref 0.0–7.0)
HGB: 14.1 g/dL (ref 11.6–15.9)
MCH: 28.6 pg (ref 26.0–34.0)
MCHC: 33.8 g/dL (ref 32.0–36.0)
RDW: 15.4 % — ABNORMAL HIGH (ref 11.3–14.5)
WBC: 8.2 10*3/uL (ref 3.9–10.0)
lymph#: 2.2 10*3/uL (ref 0.9–3.3)

## 2007-09-26 LAB — LACTATE DEHYDROGENASE: LDH: 176 U/L (ref 94–250)

## 2007-10-28 LAB — CBC WITH DIFFERENTIAL/PLATELET
BASO%: 0.3 % (ref 0.0–2.0)
EOS%: 2.5 % (ref 0.0–7.0)
HCT: 41.9 % (ref 34.8–46.6)
MCH: 28.9 pg (ref 26.0–34.0)
MCHC: 33.8 g/dL (ref 32.0–36.0)
NEUT%: 58.2 % (ref 39.6–76.8)
lymph#: 2.7 10*3/uL (ref 0.9–3.3)

## 2007-10-28 LAB — COMPREHENSIVE METABOLIC PANEL
ALT: 8 U/L (ref 0–35)
AST: 14 U/L (ref 0–37)
Calcium: 9.2 mg/dL (ref 8.4–10.5)
Chloride: 104 mEq/L (ref 96–112)
Creatinine, Ser: 0.87 mg/dL (ref 0.40–1.20)
Total Bilirubin: 0.5 mg/dL (ref 0.3–1.2)

## 2007-11-25 ENCOUNTER — Ambulatory Visit: Payer: Self-pay | Admitting: Oncology

## 2007-11-27 LAB — CBC WITH DIFFERENTIAL/PLATELET
Basophils Absolute: 0 10*3/uL (ref 0.0–0.1)
EOS%: 3.6 % (ref 0.0–7.0)
HCT: 42.2 % (ref 34.8–46.6)
HGB: 13.9 g/dL (ref 11.6–15.9)
MCH: 28.5 pg (ref 26.0–34.0)
MCV: 86.7 fL (ref 81.0–101.0)
MONO%: 12.1 % (ref 0.0–13.0)
NEUT%: 44.2 % (ref 39.6–76.8)
Platelets: 172 10*3/uL (ref 145–400)

## 2007-11-27 LAB — COMPREHENSIVE METABOLIC PANEL
ALT: <8 U/L (ref 0–35)
AST: 13 U/L (ref 0–37)
Albumin: 4.3 g/dL (ref 3.5–5.2)
Alkaline Phosphatase: 81 U/L (ref 39–117)
Glucose, Bld: 98 mg/dL (ref 70–99)
Potassium: 4.1 mEq/L (ref 3.5–5.3)
Sodium: 140 mEq/L (ref 135–145)
Total Protein: 7.3 g/dL (ref 6.0–8.3)

## 2007-12-25 LAB — COMPREHENSIVE METABOLIC PANEL
AST: 14 U/L (ref 0–37)
Albumin: 4.4 g/dL (ref 3.5–5.2)
Alkaline Phosphatase: 81 U/L (ref 39–117)
Potassium: 3.7 mEq/L (ref 3.5–5.3)
Sodium: 133 mEq/L — ABNORMAL LOW (ref 135–145)
Total Bilirubin: 0.4 mg/dL (ref 0.3–1.2)
Total Protein: 7.4 g/dL (ref 6.0–8.3)

## 2007-12-25 LAB — CBC WITH DIFFERENTIAL/PLATELET
BASO%: 0.5 % (ref 0.0–2.0)
EOS%: 1.5 % (ref 0.0–7.0)
LYMPH%: 31.1 % (ref 14.0–48.0)
MCH: 28 pg (ref 26.0–34.0)
MCHC: 32.4 g/dL (ref 32.0–36.0)
MCV: 86.4 fL (ref 81.0–101.0)
MONO%: 14.8 % — ABNORMAL HIGH (ref 0.0–13.0)
NEUT#: 4 10*3/uL (ref 1.5–6.5)
RBC: 4.98 10*6/uL (ref 3.70–5.32)
RDW: 14.8 % — ABNORMAL HIGH (ref 11.3–14.5)

## 2008-01-20 ENCOUNTER — Ambulatory Visit: Payer: Self-pay | Admitting: Oncology

## 2008-01-20 LAB — COMPREHENSIVE METABOLIC PANEL
ALT: <8 U/L (ref 0–35)
AST: 11 U/L (ref 0–37)
CO2: 24 mEq/L (ref 19–32)
Creatinine, Ser: 0.8 mg/dL (ref 0.40–1.20)
Total Bilirubin: 0.7 mg/dL (ref 0.3–1.2)

## 2008-01-20 LAB — CBC WITH DIFFERENTIAL/PLATELET
BASO%: 1.5 % (ref 0.0–2.0)
Basophils Absolute: 0.1 10*3/uL (ref 0.0–0.1)
EOS%: 1.4 % (ref 0.0–7.0)
HGB: 13.8 g/dL (ref 11.6–15.9)
MCH: 29.3 pg (ref 26.0–34.0)
MCHC: 33.1 g/dL (ref 32.0–36.0)
MCV: 88.5 fL (ref 81.0–101.0)
MONO%: 12.7 % (ref 0.0–13.0)
RDW: 13.3 % (ref 11.3–14.5)
lymph#: 1.9 10*3/uL (ref 0.9–3.3)

## 2008-01-21 LAB — CBC WITH DIFFERENTIAL/PLATELET
BASO%: 1.5 % (ref 0.0–2.0)
Basophils Absolute: 0.1 10*3/uL (ref 0.0–0.1)
HCT: 42.9 % (ref 34.8–46.6)
HGB: 14 g/dL (ref 11.6–15.9)
LYMPH%: 30.8 % (ref 14.0–48.0)
MCHC: 32.7 g/dL (ref 32.0–36.0)
MONO#: 1 10*3/uL — ABNORMAL HIGH (ref 0.1–0.9)
NEUT%: 55.6 % (ref 39.6–76.8)
Platelets: 49 10*3/uL — ABNORMAL LOW (ref 145–400)
WBC: 9.7 10*3/uL (ref 3.9–10.0)

## 2008-01-23 LAB — CBC WITH DIFFERENTIAL/PLATELET
Basophils Absolute: 0.1 10*3/uL (ref 0.0–0.1)
EOS%: 2.4 % (ref 0.0–7.0)
Eosinophils Absolute: 0.2 10*3/uL (ref 0.0–0.5)
HCT: 41.2 % (ref 34.8–46.6)
HGB: 13.7 g/dL (ref 11.6–15.9)
LYMPH%: 32.1 % (ref 14.0–48.0)
MCH: 28.9 pg (ref 26.0–34.0)
MCV: 86.5 fL (ref 81.0–101.0)
MONO%: 11.7 % (ref 0.0–13.0)
NEUT%: 52.4 % (ref 39.6–76.8)
Platelets: 60 10*3/uL — ABNORMAL LOW (ref 145–400)
RDW: 13.6 % (ref 11.3–14.5)

## 2008-02-26 LAB — CBC WITH DIFFERENTIAL/PLATELET
Basophils Absolute: 0.1 10*3/uL (ref 0.0–0.1)
HCT: 39.8 % (ref 34.8–46.6)
HGB: 13.4 g/dL (ref 11.6–15.9)
MONO#: 1.1 10*3/uL — ABNORMAL HIGH (ref 0.1–0.9)
NEUT%: 57.1 % (ref 39.6–76.8)
WBC: 8 10*3/uL (ref 3.9–10.0)
lymph#: 2.1 10*3/uL (ref 0.9–3.3)

## 2008-02-26 LAB — COMPREHENSIVE METABOLIC PANEL
BUN: 7 mg/dL (ref 6–23)
CO2: 27 mEq/L (ref 19–32)
Calcium: 9.3 mg/dL (ref 8.4–10.5)
Chloride: 104 mEq/L (ref 96–112)
Creatinine, Ser: 0.78 mg/dL (ref 0.40–1.20)

## 2008-03-04 LAB — CBC WITH DIFFERENTIAL/PLATELET
BASO%: 0.2 % (ref 0.0–2.0)
HCT: 41 % (ref 34.8–46.6)
HGB: 13.7 g/dL (ref 11.6–15.9)
MCHC: 33.4 g/dL (ref 32.0–36.0)
MONO#: 0.9 10*3/uL (ref 0.1–0.9)
NEUT%: 51.4 % (ref 39.6–76.8)
WBC: 8 10*3/uL (ref 3.9–10.0)
lymph#: 2.8 10*3/uL (ref 0.9–3.3)

## 2008-03-06 ENCOUNTER — Ambulatory Visit: Payer: Self-pay | Admitting: Oncology

## 2008-03-06 LAB — CBC WITH DIFFERENTIAL/PLATELET
Basophils Absolute: 0 10*3/uL (ref 0.0–0.1)
EOS%: 2.8 % (ref 0.0–7.0)
Eosinophils Absolute: 0.2 10*3/uL (ref 0.0–0.5)
HCT: 40.9 % (ref 34.8–46.6)
HGB: 13.7 g/dL (ref 11.6–15.9)
MCH: 28.8 pg (ref 26.0–34.0)
MONO#: 1 10*3/uL — ABNORMAL HIGH (ref 0.1–0.9)
NEUT#: 4.2 10*3/uL (ref 1.5–6.5)
NEUT%: 52.3 % (ref 39.6–76.8)
lymph#: 2.5 10*3/uL (ref 0.9–3.3)

## 2008-03-11 LAB — CBC WITH DIFFERENTIAL/PLATELET
Basophils Absolute: 0.2 10*3/uL — ABNORMAL HIGH (ref 0.0–0.1)
Eosinophils Absolute: 0.2 10*3/uL (ref 0.0–0.5)
HCT: 41.5 % (ref 34.8–46.6)
HGB: 13.4 g/dL (ref 11.6–15.9)
LYMPH%: 37.4 % (ref 14.0–48.0)
MCV: 86.5 fL (ref 81.0–101.0)
MONO#: 1 10*3/uL — ABNORMAL HIGH (ref 0.1–0.9)
MONO%: 11.7 % (ref 0.0–13.0)
NEUT#: 3.9 10*3/uL (ref 1.5–6.5)
NEUT%: 46.7 % (ref 39.6–76.8)
Platelets: 78 10*3/uL — ABNORMAL LOW (ref 145–400)

## 2008-03-18 LAB — CBC WITH DIFFERENTIAL/PLATELET
BASO%: 0.5 % (ref 0.0–2.0)
EOS%: 4.3 % (ref 0.0–7.0)
LYMPH%: 35.5 % (ref 14.0–48.0)
MCH: 28.5 pg (ref 26.0–34.0)
MCHC: 33.4 g/dL (ref 32.0–36.0)
MONO#: 1 10*3/uL — ABNORMAL HIGH (ref 0.1–0.9)
Platelets: 42 10*3/uL — ABNORMAL LOW (ref 145–400)
RBC: 4.8 10*6/uL (ref 3.70–5.32)
WBC: 7.9 10*3/uL (ref 3.9–10.0)

## 2008-03-25 LAB — CBC WITH DIFFERENTIAL/PLATELET
BASO%: 1.5 % (ref 0.0–2.0)
EOS%: 2.4 % (ref 0.0–7.0)
HCT: 40.1 % (ref 34.8–46.6)
MCH: 28.5 pg (ref 26.0–34.0)
MCHC: 33.4 g/dL (ref 32.0–36.0)
MONO#: 0.8 10*3/uL (ref 0.1–0.9)
NEUT%: 56 % (ref 39.6–76.8)
RBC: 4.69 10*6/uL (ref 3.70–5.32)
RDW: 14.8 % — ABNORMAL HIGH (ref 11.3–14.5)
WBC: 8.2 10*3/uL (ref 3.9–10.0)
lymph#: 2.5 10*3/uL (ref 0.9–3.3)

## 2008-03-25 LAB — LACTATE DEHYDROGENASE: LDH: 184 U/L (ref 94–250)

## 2008-03-25 LAB — COMPREHENSIVE METABOLIC PANEL
ALT: <8 U/L (ref 0–35)
AST: 15 U/L (ref 0–37)
Albumin: 4.2 g/dL (ref 3.5–5.2)
CO2: 26 mEq/L (ref 19–32)
Calcium: 8.7 mg/dL (ref 8.4–10.5)
Chloride: 103 mEq/L (ref 96–112)
Potassium: 3.5 mEq/L (ref 3.5–5.3)
Sodium: 138 mEq/L (ref 135–145)
Total Protein: 7.2 g/dL (ref 6.0–8.3)

## 2008-04-08 LAB — CBC WITH DIFFERENTIAL/PLATELET
BASO%: 2 % (ref 0.0–2.0)
EOS%: 3.1 % (ref 0.0–7.0)
HCT: 39.4 % (ref 34.8–46.6)
LYMPH%: 35.3 % (ref 14.0–48.0)
MCH: 28.5 pg (ref 26.0–34.0)
MCHC: 32.9 g/dL (ref 32.0–36.0)
MONO#: 1.1 10*3/uL — ABNORMAL HIGH (ref 0.1–0.9)
MONO%: 12.8 % (ref 0.0–13.0)
NEUT%: 46.8 % (ref 39.6–76.8)
Platelets: 42 10*3/uL — ABNORMAL LOW (ref 145–400)
RBC: 4.54 10*6/uL (ref 3.70–5.32)
WBC: 8.7 10*3/uL (ref 3.9–10.0)

## 2008-04-22 ENCOUNTER — Ambulatory Visit: Payer: Self-pay | Admitting: Oncology

## 2008-04-22 LAB — CBC WITH DIFFERENTIAL/PLATELET
BASO%: 0.6 % (ref 0.0–2.0)
EOS%: 2.3 % (ref 0.0–7.0)
MCH: 28.7 pg (ref 26.0–34.0)
MCHC: 33.7 g/dL (ref 32.0–36.0)
MONO#: 1 10*3/uL — ABNORMAL HIGH (ref 0.1–0.9)
NEUT%: 56.3 % (ref 39.6–76.8)
RBC: 4.6 10*6/uL (ref 3.70–5.32)
RDW: 14.2 % (ref 11.3–14.5)
WBC: 8.6 10*3/uL (ref 3.9–10.0)
lymph#: 2.5 10*3/uL (ref 0.9–3.3)

## 2008-05-06 LAB — CBC WITH DIFFERENTIAL/PLATELET
Eosinophils Absolute: 0.2 10*3/uL (ref 0.0–0.5)
HCT: 39.3 % (ref 34.8–46.6)
LYMPH%: 35 % (ref 14.0–48.0)
MCHC: 33.1 g/dL (ref 32.0–36.0)
MCV: 85.5 fL (ref 81.0–101.0)
MONO#: 0.9 10*3/uL (ref 0.1–0.9)
MONO%: 10.1 % (ref 0.0–13.0)
NEUT#: 4.5 10*3/uL (ref 1.5–6.5)
NEUT%: 52.4 % (ref 39.6–76.8)
Platelets: 88 10*3/uL — ABNORMAL LOW (ref 145–400)
RBC: 4.59 10*6/uL (ref 3.70–5.32)
WBC: 8.6 10*3/uL (ref 3.9–10.0)

## 2008-05-27 LAB — CBC WITH DIFFERENTIAL/PLATELET
Basophils Absolute: 0 10*3/uL (ref 0.0–0.1)
EOS%: 2.5 % (ref 0.0–7.0)
Eosinophils Absolute: 0.2 10*3/uL (ref 0.0–0.5)
HCT: 40 % (ref 34.8–46.6)
HGB: 13.3 g/dL (ref 11.6–15.9)
LYMPH%: 40.9 % (ref 14.0–48.0)
MCH: 27.9 pg (ref 26.0–34.0)
MCV: 83.8 fL (ref 81.0–101.0)
MONO%: 12.8 % (ref 0.0–13.0)
NEUT#: 3.2 10*3/uL (ref 1.5–6.5)
NEUT%: 43.8 % (ref 39.6–76.8)
Platelets: 24 10*3/uL — ABNORMAL LOW (ref 145–400)
RDW: 14.3 % (ref 11.3–14.5)

## 2008-06-01 ENCOUNTER — Other Ambulatory Visit: Admission: RE | Admit: 2008-06-01 | Discharge: 2008-06-01 | Payer: Self-pay | Admitting: *Deleted

## 2008-06-19 ENCOUNTER — Ambulatory Visit: Payer: Self-pay | Admitting: Oncology

## 2008-06-24 LAB — CBC WITH DIFFERENTIAL/PLATELET
BASO%: 0.2 % (ref 0.0–2.0)
EOS%: 2.1 % (ref 0.0–7.0)
HCT: 40.2 % (ref 34.8–46.6)
LYMPH%: 33.7 % (ref 14.0–48.0)
MCH: 28 pg (ref 26.0–34.0)
MCHC: 32.9 g/dL (ref 32.0–36.0)
MCV: 85 fL (ref 81.0–101.0)
MONO%: 10.6 % (ref 0.0–13.0)
NEUT%: 53.4 % (ref 39.6–76.8)
Platelets: 60 10*3/uL — ABNORMAL LOW (ref 145–400)
RBC: 4.73 10*6/uL (ref 3.70–5.32)
WBC: 9 10*3/uL (ref 3.9–10.0)

## 2008-06-24 LAB — COMPREHENSIVE METABOLIC PANEL
ALT: <8 U/L (ref 0–35)
Alkaline Phosphatase: 78 U/L (ref 39–117)
Creatinine, Ser: 0.81 mg/dL (ref 0.40–1.20)
Sodium: 137 mEq/L (ref 135–145)
Total Bilirubin: 0.4 mg/dL (ref 0.3–1.2)
Total Protein: 7.2 g/dL (ref 6.0–8.3)

## 2008-07-14 LAB — CBC WITH DIFFERENTIAL/PLATELET
Basophils Absolute: 0 10*3/uL (ref 0.0–0.1)
EOS%: 2.5 % (ref 0.0–7.0)
Eosinophils Absolute: 0.2 10*3/uL (ref 0.0–0.5)
HCT: 39.4 % (ref 34.8–46.6)
HGB: 12.8 g/dL (ref 11.6–15.9)
MCH: 27.8 pg (ref 26.0–34.0)
NEUT#: 3.6 10*3/uL (ref 1.5–6.5)
NEUT%: 54 % (ref 39.6–76.8)
RDW: 14.6 % — ABNORMAL HIGH (ref 11.3–14.5)
lymph#: 2.1 10*3/uL (ref 0.9–3.3)

## 2008-08-10 ENCOUNTER — Ambulatory Visit: Payer: Self-pay | Admitting: Oncology

## 2008-08-10 ENCOUNTER — Inpatient Hospital Stay (HOSPITAL_COMMUNITY): Admission: AD | Admit: 2008-08-10 | Discharge: 2008-08-13 | Payer: Self-pay | Admitting: Oncology

## 2008-08-10 LAB — TECHNOLOGIST REVIEW

## 2008-08-10 LAB — CBC WITH DIFFERENTIAL/PLATELET
Basophils Absolute: 0.1 10*3/uL (ref 0.0–0.1)
Eosinophils Absolute: 0 10*3/uL (ref 0.0–0.5)
HGB: 13.5 g/dL (ref 11.6–15.9)
LYMPH%: 40.1 % (ref 14.0–48.0)
MCV: 83.1 fL (ref 81.0–101.0)
MONO#: 0.8 10*3/uL (ref 0.1–0.9)
MONO%: 18.9 % — ABNORMAL HIGH (ref 0.0–13.0)
NEUT#: 1.6 10*3/uL (ref 1.5–6.5)
Platelets: <6 10*3/uL — CL (ref 145–400)
RBC: 4.91 10*6/uL (ref 3.70–5.32)
WBC: 4.3 10*3/uL (ref 3.9–10.0)

## 2008-08-19 LAB — CBC WITH DIFFERENTIAL/PLATELET
Basophils Absolute: 0 10*3/uL (ref 0.0–0.1)
Eosinophils Absolute: 0.1 10*3/uL (ref 0.0–0.5)
HGB: 12.1 g/dL (ref 11.6–15.9)
NEUT#: 4.5 10*3/uL (ref 1.5–6.5)
RDW: 15 % — ABNORMAL HIGH (ref 11.3–14.5)
WBC: 8.3 10*3/uL (ref 3.9–10.0)
lymph#: 2.5 10*3/uL (ref 0.9–3.3)

## 2008-08-19 LAB — TECHNOLOGIST REVIEW

## 2008-08-26 LAB — TECHNOLOGIST REVIEW

## 2008-08-26 LAB — CBC WITH DIFFERENTIAL/PLATELET
Eosinophils Absolute: 0.1 10*3/uL (ref 0.0–0.5)
HCT: 36.8 % (ref 34.8–46.6)
LYMPH%: 37.3 % (ref 14.0–48.0)
MONO#: 0.9 10*3/uL (ref 0.1–0.9)
NEUT#: 2.6 10*3/uL (ref 1.5–6.5)
NEUT%: 45.3 % (ref 39.6–76.8)
Platelets: 493 10*3/uL — ABNORMAL HIGH (ref 145–400)
RBC: 4.36 10*6/uL (ref 3.70–5.32)
WBC: 5.6 10*3/uL (ref 3.9–10.0)
lymph#: 2.1 10*3/uL (ref 0.9–3.3)

## 2008-09-24 LAB — CBC WITH DIFFERENTIAL/PLATELET
BASO%: 1.3 % (ref 0.0–2.0)
Basophils Absolute: 0.1 10*3/uL (ref 0.0–0.1)
EOS%: 3.7 % (ref 0.0–7.0)
Eosinophils Absolute: 0.3 10*3/uL (ref 0.0–0.5)
HCT: 38.6 % (ref 34.8–46.6)
HGB: 12.8 g/dL (ref 11.6–15.9)
LYMPH%: 35.4 % (ref 14.0–48.0)
MCH: 27.7 pg (ref 26.0–34.0)
MCHC: 33.1 g/dL (ref 32.0–36.0)
MCV: 83.6 fL (ref 81.0–101.0)
MONO#: 1 10*3/uL — ABNORMAL HIGH (ref 0.1–0.9)
MONO%: 12.9 % (ref 0.0–13.0)
NEUT#: 3.7 10*3/uL (ref 1.5–6.5)
NEUT%: 46.7 % (ref 39.6–76.8)
Platelets: 73 10*3/uL — ABNORMAL LOW (ref 145–400)
RBC: 4.62 10*6/uL (ref 3.70–5.32)
RDW: 14 % (ref 11.3–14.5)
WBC: 7.9 10*3/uL (ref 3.9–10.0)
lymph#: 2.8 10*3/uL (ref 0.9–3.3)

## 2008-09-24 LAB — TECHNOLOGIST REVIEW

## 2008-10-05 ENCOUNTER — Ambulatory Visit: Payer: Self-pay | Admitting: Oncology

## 2008-10-07 LAB — CBC WITH DIFFERENTIAL/PLATELET
BASO%: 1.6 % (ref 0.0–2.0)
EOS%: 2.3 % (ref 0.0–7.0)
HGB: 13 g/dL (ref 11.6–15.9)
MCV: 83.6 fL (ref 81.0–101.0)
MONO#: 0.8 10*3/uL (ref 0.1–0.9)
NEUT#: 3.5 10*3/uL (ref 1.5–6.5)
NEUT%: 56.6 % (ref 39.6–76.8)
Platelets: 80 10*3/uL — ABNORMAL LOW (ref 145–400)
RDW: 13.6 % (ref 11.3–14.5)
lymph#: 1.7 10*3/uL (ref 0.9–3.3)

## 2008-10-07 LAB — TECHNOLOGIST REVIEW

## 2008-10-21 LAB — CBC WITH DIFFERENTIAL/PLATELET
BASO%: 0.9 % (ref 0.0–2.0)
EOS%: 1.7 % (ref 0.0–7.0)
HCT: 40 % (ref 34.8–46.6)
MCH: 28.1 pg (ref 26.0–34.0)
MCHC: 32.9 g/dL (ref 32.0–36.0)
MONO#: 1.3 10*3/uL — ABNORMAL HIGH (ref 0.1–0.9)
NEUT%: 50.4 % (ref 39.6–76.8)
RDW: 14.8 % — ABNORMAL HIGH (ref 11.3–14.5)
WBC: 9.1 10*3/uL (ref 3.9–10.0)
lymph#: 2.9 10*3/uL (ref 0.9–3.3)

## 2008-11-16 ENCOUNTER — Ambulatory Visit: Payer: Self-pay | Admitting: Oncology

## 2008-11-18 LAB — CBC WITH DIFFERENTIAL/PLATELET
Basophils Absolute: 0.1 10*3/uL (ref 0.0–0.1)
Eosinophils Absolute: 0.1 10*3/uL (ref 0.0–0.5)
HGB: 12.5 g/dL (ref 11.6–15.9)
MCV: 82.5 fL (ref 81.0–101.0)
NEUT#: 4.4 10*3/uL (ref 1.5–6.5)
RDW: 13.8 % (ref 11.3–14.5)
lymph#: 3.5 10*3/uL — ABNORMAL HIGH (ref 0.9–3.3)

## 2008-12-31 ENCOUNTER — Ambulatory Visit: Payer: Self-pay | Admitting: Oncology

## 2008-12-31 LAB — CBC WITH DIFFERENTIAL/PLATELET
Basophils Absolute: 0 10*3/uL (ref 0.0–0.1)
Eosinophils Absolute: 0.3 10*3/uL (ref 0.0–0.5)
HCT: 38.9 % (ref 34.8–46.6)
HGB: 12.8 g/dL (ref 11.6–15.9)
MONO#: 0.9 10*3/uL (ref 0.1–0.9)
NEUT%: 53.8 % (ref 39.6–76.8)
WBC: 10 10*3/uL (ref 3.9–10.0)
lymph#: 3.4 10*3/uL — ABNORMAL HIGH (ref 0.9–3.3)

## 2009-01-21 LAB — CBC WITH DIFFERENTIAL/PLATELET
Eosinophils Absolute: 0.3 10*3/uL (ref 0.0–0.5)
HGB: 12.8 g/dL (ref 11.6–15.9)
LYMPH%: 29 % (ref 14.0–49.7)
MONO#: 1.1 10*3/uL — ABNORMAL HIGH (ref 0.1–0.9)
NEUT#: 4.7 10*3/uL (ref 1.5–6.5)
Platelets: 112 10*3/uL — ABNORMAL LOW (ref 145–400)
RBC: 4.86 10*6/uL (ref 3.70–5.45)
WBC: 8.7 10*3/uL (ref 3.9–10.3)
nRBC: 0 % (ref 0–0)

## 2009-01-21 LAB — TECHNOLOGIST REVIEW

## 2009-02-16 ENCOUNTER — Ambulatory Visit: Payer: Self-pay | Admitting: Oncology

## 2009-02-17 LAB — CBC WITH DIFFERENTIAL/PLATELET
Basophils Absolute: 0.1 10*3/uL (ref 0.0–0.1)
Eosinophils Absolute: 0.3 10*3/uL (ref 0.0–0.5)
HGB: 12.8 g/dL (ref 11.6–15.9)
MONO#: 1.3 10*3/uL — ABNORMAL HIGH (ref 0.1–0.9)
NEUT#: 6.3 10*3/uL (ref 1.5–6.5)
Platelets: 110 10*3/uL — ABNORMAL LOW (ref 145–400)
RBC: 4.98 10*6/uL (ref 3.70–5.45)
RDW: 14.8 % — ABNORMAL HIGH (ref 11.2–14.5)
WBC: 10.8 10*3/uL — ABNORMAL HIGH (ref 3.9–10.3)
nRBC: 0 % (ref 0–0)

## 2009-03-10 LAB — CBC WITH DIFFERENTIAL/PLATELET
Basophils Absolute: 0.1 10*3/uL (ref 0.0–0.1)
EOS%: 3.1 % (ref 0.0–7.0)
HCT: 39.2 % (ref 34.8–46.6)
HGB: 13.2 g/dL (ref 11.6–15.9)
MCH: 26.7 pg (ref 25.1–34.0)
MONO#: 1 10*3/uL — ABNORMAL HIGH (ref 0.1–0.9)
NEUT#: 4.8 10*3/uL (ref 1.5–6.5)
RDW: 14.6 % — ABNORMAL HIGH (ref 11.2–14.5)
WBC: 9.4 10*3/uL (ref 3.9–10.3)
lymph#: 3.2 10*3/uL (ref 0.9–3.3)

## 2009-04-05 ENCOUNTER — Ambulatory Visit: Payer: Self-pay | Admitting: Oncology

## 2009-04-07 LAB — CBC WITH DIFFERENTIAL/PLATELET
BASO%: 0.8 % (ref 0.0–2.0)
Basophils Absolute: 0.1 10*3/uL (ref 0.0–0.1)
EOS%: 3.2 % (ref 0.0–7.0)
HGB: 12.7 g/dL (ref 11.6–15.9)
MCH: 26.6 pg (ref 25.1–34.0)
MCV: 79.9 fL (ref 79.5–101.0)
MONO%: 12.2 % (ref 0.0–14.0)
RBC: 4.77 10*6/uL (ref 3.70–5.45)
RDW: 14.5 % (ref 11.2–14.5)
lymph#: 2.5 10*3/uL (ref 0.9–3.3)
nRBC: 0 % (ref 0–0)

## 2009-04-14 LAB — CBC WITH DIFFERENTIAL/PLATELET
BASO%: 0.8 % (ref 0.0–2.0)
Eosinophils Absolute: 0.4 10*3/uL (ref 0.0–0.5)
LYMPH%: 31.8 % (ref 14.0–49.7)
MCHC: 32.9 g/dL (ref 31.5–36.0)
MONO#: 1 10*3/uL — ABNORMAL HIGH (ref 0.1–0.9)
NEUT#: 4.6 10*3/uL (ref 1.5–6.5)
RBC: 4.69 10*6/uL (ref 3.70–5.45)
RDW: 14.9 % — ABNORMAL HIGH (ref 11.2–14.5)
WBC: 8.8 10*3/uL (ref 3.9–10.3)
lymph#: 2.8 10*3/uL (ref 0.9–3.3)
nRBC: 0 % (ref 0–0)

## 2009-05-12 LAB — CBC WITH DIFFERENTIAL/PLATELET
BASO%: 1 % (ref 0.0–2.0)
EOS%: 5.2 % (ref 0.0–7.0)
LYMPH%: 31.7 % (ref 14.0–49.7)
MCH: 26.3 pg (ref 25.1–34.0)
MCHC: 33.2 g/dL (ref 31.5–36.0)
MONO#: 1 10*3/uL — ABNORMAL HIGH (ref 0.1–0.9)
NEUT%: 50.6 % (ref 38.4–76.8)
Platelets: 126 10*3/uL — ABNORMAL LOW (ref 145–400)
RBC: 5.02 10*6/uL (ref 3.70–5.45)
WBC: 8.3 10*3/uL (ref 3.9–10.3)
lymph#: 2.6 10*3/uL (ref 0.9–3.3)
nRBC: 0 % (ref 0–0)

## 2009-06-07 ENCOUNTER — Ambulatory Visit: Payer: Self-pay | Admitting: Oncology

## 2009-06-09 LAB — CBC WITH DIFFERENTIAL/PLATELET
EOS%: 5 % (ref 0.0–7.0)
LYMPH%: 31.8 % (ref 14.0–49.7)
MCH: 26.3 pg (ref 25.1–34.0)
MCHC: 33.6 g/dL (ref 31.5–36.0)
MCV: 78.4 fL — ABNORMAL LOW (ref 79.5–101.0)
MONO%: 9.5 % (ref 0.0–14.0)
Platelets: 100 10*3/uL — ABNORMAL LOW (ref 145–400)
RBC: 4.86 10*6/uL (ref 3.70–5.45)
RDW: 15.1 % — ABNORMAL HIGH (ref 11.2–14.5)
nRBC: 0 % (ref 0–0)

## 2009-07-01 ENCOUNTER — Other Ambulatory Visit: Admission: RE | Admit: 2009-07-01 | Discharge: 2009-07-01 | Payer: Self-pay | Admitting: Family Medicine

## 2009-07-12 ENCOUNTER — Ambulatory Visit: Payer: Self-pay | Admitting: Oncology

## 2009-07-14 LAB — CBC WITH DIFFERENTIAL/PLATELET
BASO%: 1.3 % (ref 0.0–2.0)
EOS%: 4 % (ref 0.0–7.0)
Eosinophils Absolute: 0.3 10*3/uL (ref 0.0–0.5)
MCHC: 33 g/dL (ref 31.5–36.0)
MCV: 79.2 fL — ABNORMAL LOW (ref 79.5–101.0)
MONO%: 11.3 % (ref 0.0–14.0)
NEUT#: 3.5 10*3/uL (ref 1.5–6.5)
RBC: 4.71 10*6/uL (ref 3.70–5.45)
RDW: 15.1 % — ABNORMAL HIGH (ref 11.2–14.5)

## 2009-07-14 LAB — COMPREHENSIVE METABOLIC PANEL
ALT: 8 U/L (ref 0–35)
AST: 17 U/L (ref 0–37)
Albumin: 3.5 g/dL (ref 3.5–5.2)
Alkaline Phosphatase: 69 U/L (ref 39–117)
Glucose, Bld: 87 mg/dL (ref 70–99)
Potassium: 3.6 mEq/L (ref 3.5–5.3)
Sodium: 137 mEq/L (ref 135–145)
Total Bilirubin: 0.5 mg/dL (ref 0.3–1.2)
Total Protein: 7.2 g/dL (ref 6.0–8.3)

## 2009-08-11 ENCOUNTER — Ambulatory Visit: Payer: Self-pay | Admitting: Oncology

## 2009-08-11 LAB — CBC WITH DIFFERENTIAL/PLATELET
Basophils Absolute: 0.1 10*3/uL (ref 0.0–0.1)
EOS%: 4.3 % (ref 0.0–7.0)
HGB: 12 g/dL (ref 11.6–15.9)
LYMPH%: 38.3 % (ref 14.0–49.7)
MCH: 26.1 pg (ref 25.1–34.0)
MCV: 78.6 fL — ABNORMAL LOW (ref 79.5–101.0)
MONO%: 15 % — ABNORMAL HIGH (ref 0.0–14.0)
RBC: 4.59 10*6/uL (ref 3.70–5.45)
RDW: 14.7 % — ABNORMAL HIGH (ref 11.2–14.5)

## 2009-09-09 ENCOUNTER — Ambulatory Visit: Payer: Self-pay | Admitting: Oncology

## 2009-09-09 LAB — CBC WITH DIFFERENTIAL/PLATELET
BASO%: 0.8 % (ref 0.0–2.0)
EOS%: 3.9 % (ref 0.0–7.0)
HCT: 39.9 % (ref 34.8–46.6)
LYMPH%: 36 % (ref 14.0–49.7)
MCH: 26.2 pg (ref 25.1–34.0)
MCHC: 32.6 g/dL (ref 31.5–36.0)
NEUT%: 50.6 % (ref 38.4–76.8)
Platelets: 61 10*3/uL — ABNORMAL LOW (ref 145–400)

## 2009-10-04 LAB — CBC WITH DIFFERENTIAL/PLATELET
Basophils Absolute: 0.1 10*3/uL (ref 0.0–0.1)
Eosinophils Absolute: 0.3 10*3/uL (ref 0.0–0.5)
HGB: 12.8 g/dL (ref 11.6–15.9)
LYMPH%: 29.1 % (ref 14.0–49.7)
MCV: 81.8 fL (ref 79.5–101.0)
MONO%: 11.5 % (ref 0.0–14.0)
NEUT#: 5.5 10*3/uL (ref 1.5–6.5)
Platelets: 80 10*3/uL — ABNORMAL LOW (ref 145–400)
RBC: 4.9 10*6/uL (ref 3.70–5.45)

## 2009-11-01 ENCOUNTER — Ambulatory Visit: Payer: Self-pay | Admitting: Oncology

## 2009-11-10 LAB — CBC WITH DIFFERENTIAL/PLATELET
Basophils Absolute: 0 10*3/uL (ref 0.0–0.1)
EOS%: 1.9 % (ref 0.0–7.0)
HCT: 39.3 % (ref 34.8–46.6)
HGB: 12.8 g/dL (ref 11.6–15.9)
MCH: 26.1 pg (ref 25.1–34.0)
NEUT%: 60.5 % (ref 38.4–76.8)
Platelets: 67 10*3/uL — ABNORMAL LOW (ref 145–400)
lymph#: 2.3 10*3/uL (ref 0.9–3.3)

## 2009-11-23 IMAGING — CR DG CHEST 2V
2 series · 2 of 2 positions shown · non-contrast
Comparison: 10/10/2004

CLINICAL DATA: ITP, fever and cough.

CHEST - 2 VIEW

[w chest pa]
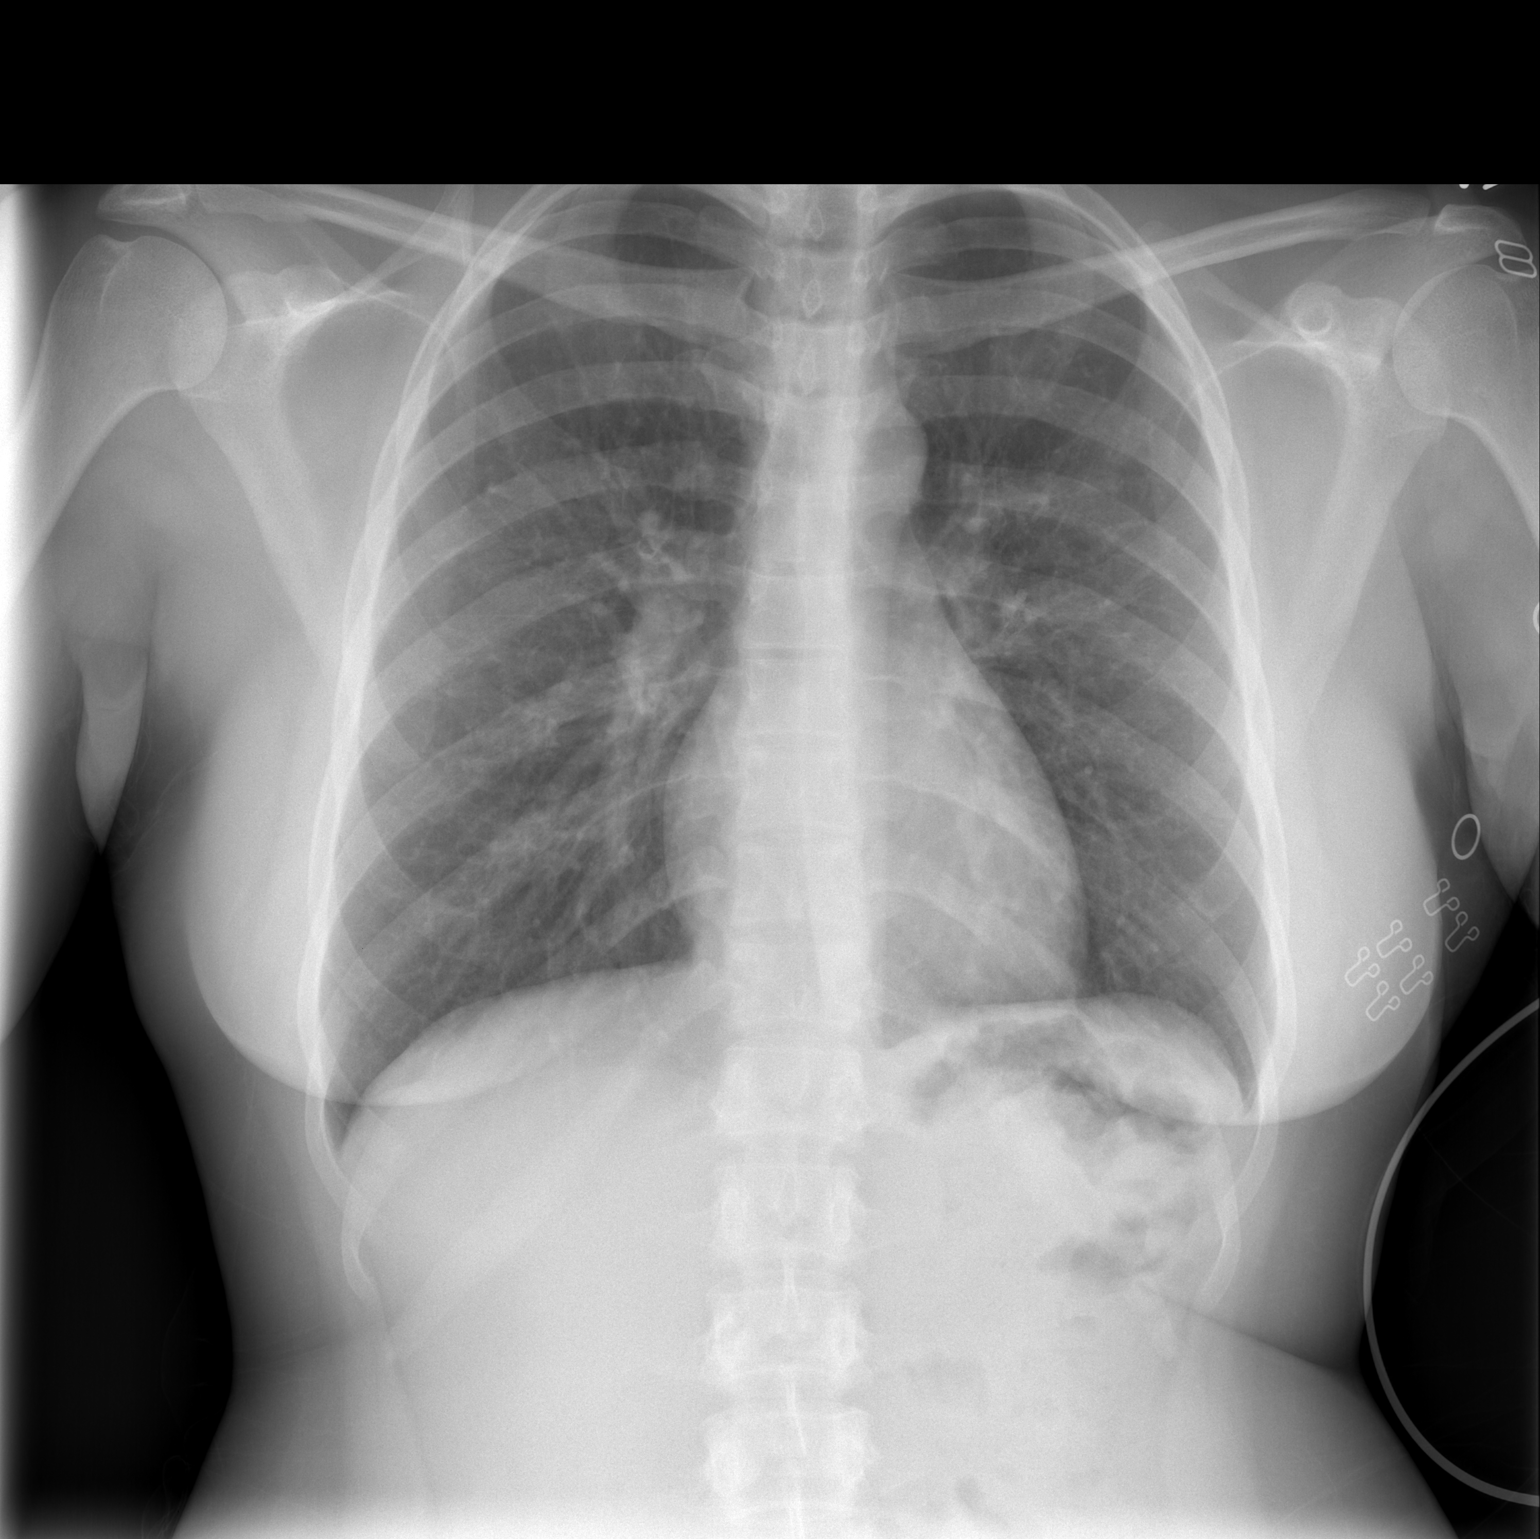

[w chest lat]
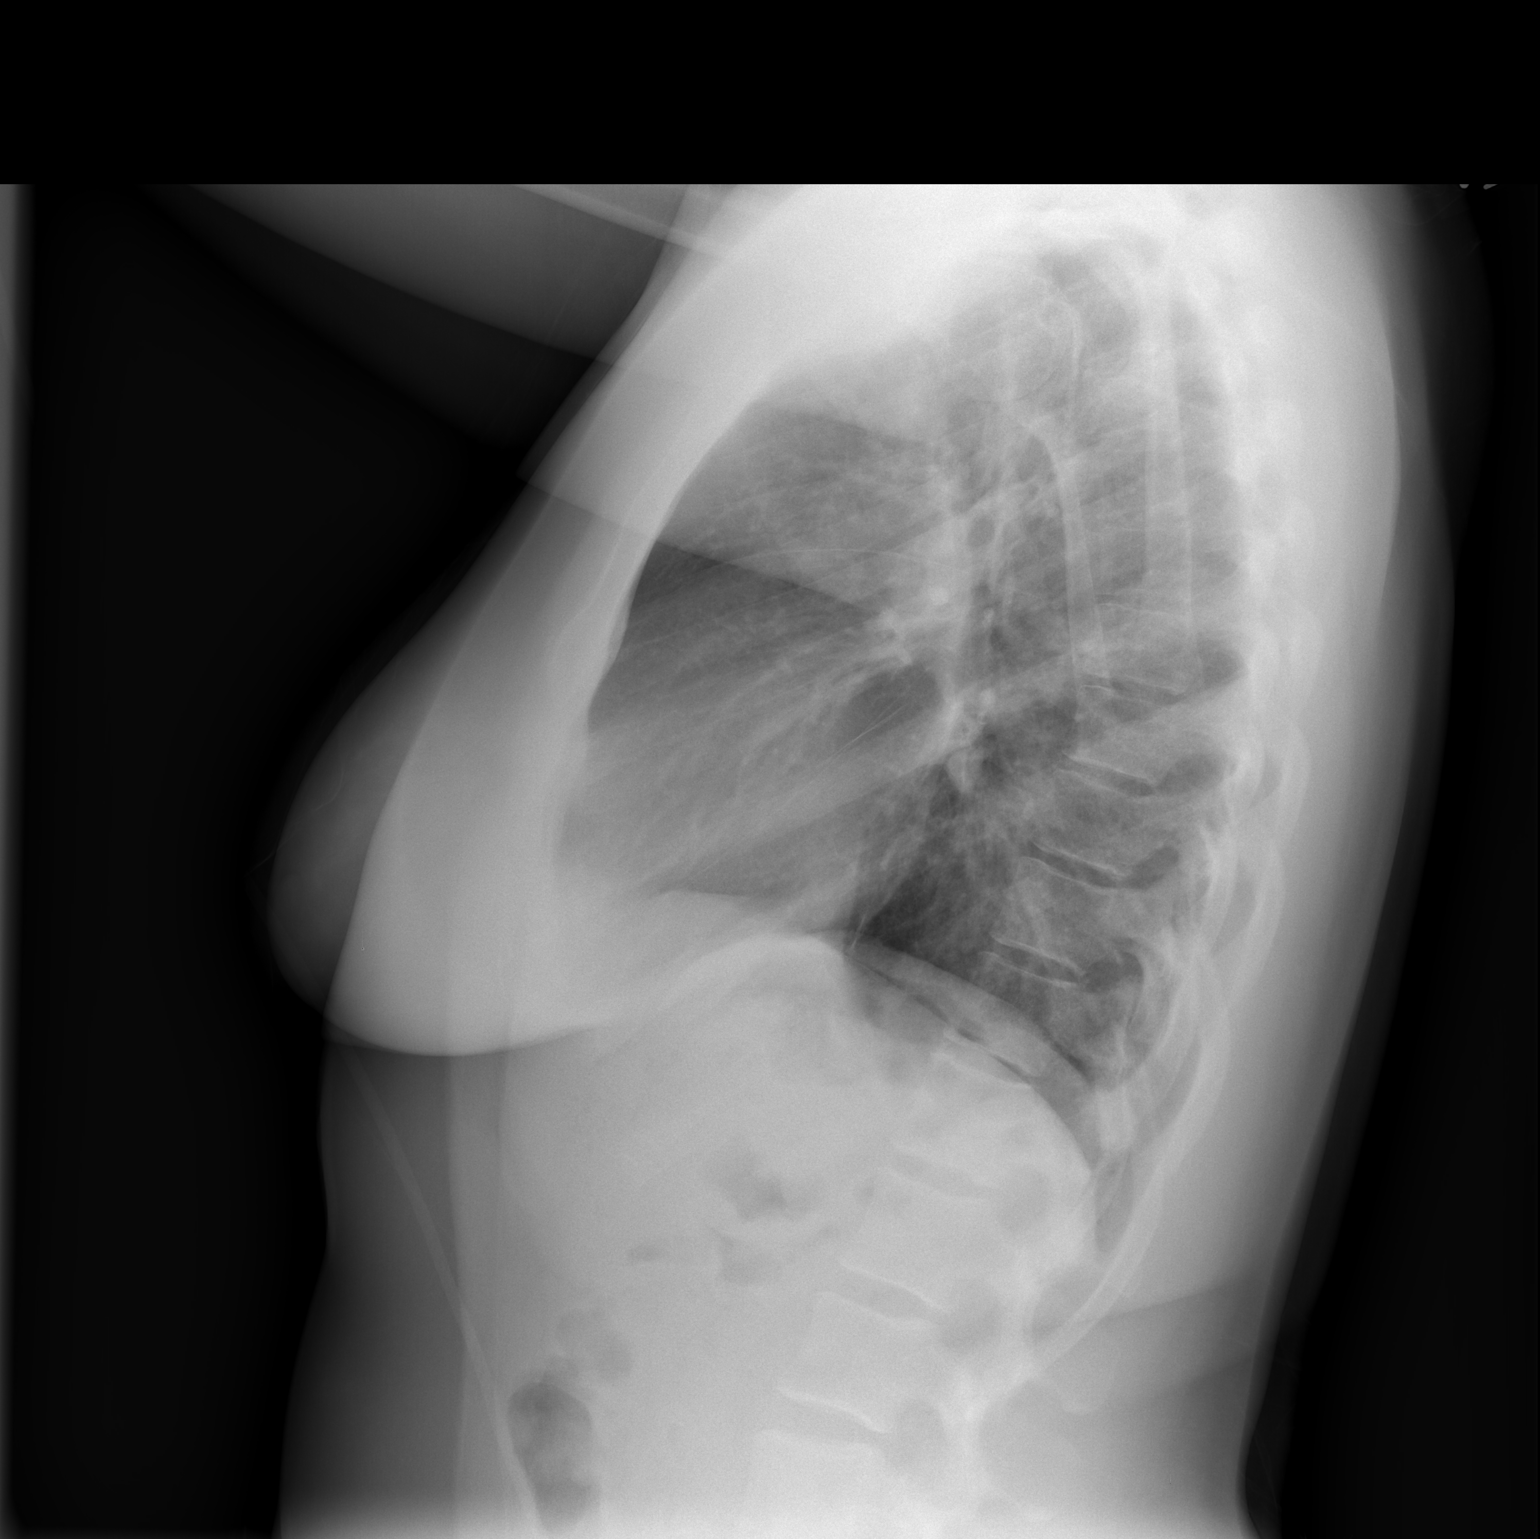

[2 of 2 positions shown; findings below may reference images not displayed]

FINDINGS: There is suggestion of asymmetric bronchial thickening in
the right perihilar lung.  Findings are suggestive of bronchitis.
No evidence of focal pneumonia is seen at this time.  No evidence
of edema, pleural fluid, pulmonary nodule or cardiomegaly.  The
bony structures are unremarkable.
IMPRESSION: Probable right perihilar bronchitis.  No focal pneumonia is seen.

## 2009-12-13 ENCOUNTER — Ambulatory Visit: Payer: Self-pay | Admitting: Oncology

## 2009-12-15 LAB — CBC WITH DIFFERENTIAL/PLATELET
Basophils Absolute: 0.1 10*3/uL (ref 0.0–0.1)
Eosinophils Absolute: 0.3 10*3/uL (ref 0.0–0.5)
HGB: 12.7 g/dL (ref 11.6–15.9)
MCV: 81.9 fL (ref 79.5–101.0)
MONO#: 1.1 10*3/uL — ABNORMAL HIGH (ref 0.1–0.9)
MONO%: 10.8 % (ref 0.0–14.0)
NEUT#: 5.4 10*3/uL (ref 1.5–6.5)
RDW: 15.6 % — ABNORMAL HIGH (ref 11.2–14.5)
WBC: 9.8 10*3/uL (ref 3.9–10.3)
nRBC: 0 % (ref 0–0)

## 2010-01-12 ENCOUNTER — Ambulatory Visit: Payer: Self-pay | Admitting: Oncology

## 2010-01-12 LAB — CBC WITH DIFFERENTIAL/PLATELET
Basophils Absolute: 0.1 10*3/uL (ref 0.0–0.1)
Eosinophils Absolute: 0.4 10*3/uL (ref 0.0–0.5)
HCT: 38.5 % (ref 34.8–46.6)
HGB: 12.5 g/dL (ref 11.6–15.9)
MCH: 26.2 pg (ref 25.1–34.0)
MCV: 80.5 fL (ref 79.5–101.0)
MONO%: 11.1 % (ref 0.0–14.0)
NEUT#: 4.7 10*3/uL (ref 1.5–6.5)
NEUT%: 52.2 % (ref 38.4–76.8)
RDW: 15.5 % — ABNORMAL HIGH (ref 11.2–14.5)
lymph#: 2.9 10*3/uL (ref 0.9–3.3)

## 2010-01-19 ENCOUNTER — Inpatient Hospital Stay (HOSPITAL_COMMUNITY): Admission: AD | Admit: 2010-01-19 | Discharge: 2010-01-21 | Payer: Self-pay | Admitting: Oncology

## 2010-01-19 LAB — CBC WITH DIFFERENTIAL/PLATELET
Basophils Absolute: 0 10*3/uL (ref 0.0–0.1)
Eosinophils Absolute: 0.4 10*3/uL (ref 0.0–0.5)
HCT: 40.1 % (ref 34.8–46.6)
HGB: 13 g/dL (ref 11.6–15.9)
LYMPH%: 40.7 % (ref 14.0–49.7)
MONO#: 0.7 10*3/uL (ref 0.1–0.9)
NEUT%: 38.1 % — ABNORMAL LOW (ref 38.4–76.8)
Platelets: 14 10*3/uL — ABNORMAL LOW (ref 145–400)
WBC: 5.5 10*3/uL (ref 3.9–10.3)
lymph#: 2.2 10*3/uL (ref 0.9–3.3)

## 2010-01-19 LAB — HOLD TUBE, BLOOD BANK

## 2010-01-20 ENCOUNTER — Ambulatory Visit: Payer: Self-pay | Admitting: Oncology

## 2010-01-23 ENCOUNTER — Emergency Department (HOSPITAL_COMMUNITY): Admission: EM | Admit: 2010-01-23 | Discharge: 2010-01-24 | Payer: Self-pay | Admitting: Emergency Medicine

## 2010-01-27 LAB — CBC WITH DIFFERENTIAL/PLATELET
Basophils Absolute: 0 10*3/uL (ref 0.0–0.1)
Eosinophils Absolute: 0.1 10*3/uL (ref 0.0–0.5)
HGB: 12.4 g/dL (ref 11.6–15.9)
MCV: 81.9 fL (ref 79.5–101.0)
MONO#: 1.3 10*3/uL — ABNORMAL HIGH (ref 0.1–0.9)
NEUT#: 5.7 10*3/uL (ref 1.5–6.5)
RBC: 4.7 10*6/uL (ref 3.70–5.45)
RDW: 16.3 % — ABNORMAL HIGH (ref 11.2–14.5)
WBC: 9.5 10*3/uL (ref 3.9–10.3)
lymph#: 2.5 10*3/uL (ref 0.9–3.3)
nRBC: 0 % (ref 0–0)

## 2010-02-03 ENCOUNTER — Ambulatory Visit (HOSPITAL_COMMUNITY): Admission: RE | Admit: 2010-02-03 | Discharge: 2010-02-03 | Payer: Self-pay | Admitting: Oncology

## 2010-02-09 LAB — CBC WITH DIFFERENTIAL/PLATELET
BASO%: 0.6 % (ref 0.0–2.0)
Eosinophils Absolute: 0.2 10*3/uL (ref 0.0–0.5)
HCT: 38.5 % (ref 34.8–46.6)
HGB: 12.5 g/dL (ref 11.6–15.9)
LYMPH%: 25.6 % (ref 14.0–49.7)
MCHC: 32.5 g/dL (ref 31.5–36.0)
MONO#: 0.9 10*3/uL (ref 0.1–0.9)
NEUT#: 4.3 10*3/uL (ref 1.5–6.5)
NEUT%: 59.1 % (ref 38.4–76.8)
Platelets: 223 10*3/uL (ref 145–400)
WBC: 7.3 10*3/uL (ref 3.9–10.3)
lymph#: 1.9 10*3/uL (ref 0.9–3.3)

## 2010-03-14 ENCOUNTER — Ambulatory Visit: Payer: Self-pay | Admitting: Oncology

## 2010-03-16 LAB — COMPREHENSIVE METABOLIC PANEL
CO2: 27 mEq/L (ref 19–32)
Calcium: 9.2 mg/dL (ref 8.4–10.5)
Chloride: 105 mEq/L (ref 96–112)
Creatinine, Ser: 0.85 mg/dL (ref 0.40–1.20)
Glucose, Bld: 100 mg/dL — ABNORMAL HIGH (ref 70–99)
Total Bilirubin: 0.6 mg/dL (ref 0.3–1.2)
Total Protein: 7.7 g/dL (ref 6.0–8.3)

## 2010-03-16 LAB — CBC WITH DIFFERENTIAL/PLATELET
Basophils Absolute: 0 10*3/uL (ref 0.0–0.1)
Eosinophils Absolute: 0.2 10*3/uL (ref 0.0–0.5)
HCT: 40.6 % (ref 34.8–46.6)
HGB: 13.3 g/dL (ref 11.6–15.9)
LYMPH%: 32.9 % (ref 14.0–49.7)
MCV: 80.9 fL (ref 79.5–101.0)
MONO#: 0.8 10*3/uL (ref 0.1–0.9)
MONO%: 11.7 % (ref 0.0–14.0)
NEUT#: 3.4 10*3/uL (ref 1.5–6.5)
Platelets: 65 10*3/uL — ABNORMAL LOW (ref 145–400)

## 2010-03-23 LAB — CBC WITH DIFFERENTIAL/PLATELET
BASO%: 0.6 % (ref 0.0–2.0)
Eosinophils Absolute: 0.4 10*3/uL (ref 0.0–0.5)
HCT: 36.7 % (ref 34.8–46.6)
LYMPH%: 36 % (ref 14.0–49.7)
MCHC: 32.4 g/dL (ref 31.5–36.0)
MONO#: 0.9 10*3/uL (ref 0.1–0.9)
NEUT#: 4.9 10*3/uL (ref 1.5–6.5)
Platelets: 77 10*3/uL — ABNORMAL LOW (ref 145–400)
RBC: 4.53 10*6/uL (ref 3.70–5.45)
WBC: 9.6 10*3/uL (ref 3.9–10.3)
lymph#: 3.5 10*3/uL — ABNORMAL HIGH (ref 0.9–3.3)
nRBC: 0 % (ref 0–0)

## 2010-03-23 LAB — TECHNOLOGIST REVIEW

## 2010-03-30 LAB — CBC WITH DIFFERENTIAL/PLATELET
Basophils Absolute: 0.1 10*3/uL (ref 0.0–0.1)
EOS%: 3.6 % (ref 0.0–7.0)
Eosinophils Absolute: 0.4 10*3/uL (ref 0.0–0.5)
HGB: 11.9 g/dL (ref 11.6–15.9)
NEUT#: 5.7 10*3/uL (ref 1.5–6.5)
RDW: 15.3 % — ABNORMAL HIGH (ref 11.2–14.5)
WBC: 10 10*3/uL (ref 3.9–10.3)
lymph#: 3.1 10*3/uL (ref 0.9–3.3)

## 2010-04-06 LAB — CBC WITH DIFFERENTIAL/PLATELET
Basophils Absolute: 0.1 10*3/uL (ref 0.0–0.1)
Eosinophils Absolute: 0.3 10*3/uL (ref 0.0–0.5)
HGB: 12.6 g/dL (ref 11.6–15.9)
MCV: 80.4 fL (ref 79.5–101.0)
MONO%: 9.1 % (ref 0.0–14.0)
NEUT#: 4.2 10*3/uL (ref 1.5–6.5)
RDW: 15 % — ABNORMAL HIGH (ref 11.2–14.5)

## 2010-04-18 ENCOUNTER — Ambulatory Visit: Payer: Self-pay | Admitting: Oncology

## 2010-04-20 LAB — CBC WITH DIFFERENTIAL/PLATELET
Basophils Absolute: 0.1 10*3/uL (ref 0.0–0.1)
Eosinophils Absolute: 0.2 10*3/uL (ref 0.0–0.5)
HCT: 37.5 % (ref 34.8–46.6)
HGB: 12.2 g/dL (ref 11.6–15.9)
LYMPH%: 37.1 % (ref 14.0–49.7)
MCHC: 32.5 g/dL (ref 31.5–36.0)
MONO#: 0.8 10*3/uL (ref 0.1–0.9)
NEUT#: 3.8 10*3/uL (ref 1.5–6.5)
NEUT%: 48.8 % (ref 38.4–76.8)
Platelets: 54 10*3/uL — ABNORMAL LOW (ref 145–400)
WBC: 7.8 10*3/uL (ref 3.9–10.3)

## 2010-04-28 LAB — CBC WITH DIFFERENTIAL/PLATELET
BASO%: 0.7 % (ref 0.0–2.0)
LYMPH%: 31.7 % (ref 14.0–49.7)
MCHC: 32.4 g/dL (ref 31.5–36.0)
MONO#: 0.7 10*3/uL (ref 0.1–0.9)
NEUT#: 4 10*3/uL (ref 1.5–6.5)
Platelets: 60 10*3/uL — ABNORMAL LOW (ref 145–400)
RBC: 4.76 10*6/uL (ref 3.70–5.45)
RDW: 15 % — ABNORMAL HIGH (ref 11.2–14.5)
WBC: 7.2 10*3/uL (ref 3.9–10.3)
nRBC: 0 % (ref 0–0)

## 2010-04-28 LAB — TECHNOLOGIST REVIEW

## 2010-05-02 ENCOUNTER — Other Ambulatory Visit: Admission: RE | Admit: 2010-05-02 | Discharge: 2010-05-02 | Payer: Self-pay | Admitting: Family Medicine

## 2010-05-11 LAB — CBC WITH DIFFERENTIAL/PLATELET
EOS%: 2.3 % (ref 0.0–7.0)
Eosinophils Absolute: 0.2 10*3/uL (ref 0.0–0.5)
MCH: 25.9 pg (ref 25.1–34.0)
MCV: 79.9 fL (ref 79.5–101.0)
MONO%: 10.2 % (ref 0.0–14.0)
NEUT#: 5.4 10*3/uL (ref 1.5–6.5)
RBC: 4.82 10*6/uL (ref 3.70–5.45)
RDW: 14.8 % — ABNORMAL HIGH (ref 11.2–14.5)
lymph#: 2.6 10*3/uL (ref 0.9–3.3)
nRBC: 0 % (ref 0–0)

## 2010-05-23 ENCOUNTER — Ambulatory Visit: Payer: Self-pay | Admitting: Oncology

## 2010-05-25 LAB — CBC WITH DIFFERENTIAL/PLATELET
BASO%: 0.5 % (ref 0.0–2.0)
MCHC: 32.3 g/dL (ref 31.5–36.0)
MONO#: 1.1 10*3/uL — ABNORMAL HIGH (ref 0.1–0.9)
RBC: 4.62 10*6/uL (ref 3.70–5.45)
WBC: 9.2 10*3/uL (ref 3.9–10.3)
lymph#: 2.4 10*3/uL (ref 0.9–3.3)
nRBC: 0 % (ref 0–0)

## 2010-06-01 LAB — CBC WITH DIFFERENTIAL/PLATELET
EOS%: 1.6 % (ref 0.0–7.0)
LYMPH%: 34.8 % (ref 14.0–49.7)
MCH: 25.9 pg (ref 25.1–34.0)
MCHC: 32.8 g/dL (ref 31.5–36.0)
MCV: 78.9 fL — ABNORMAL LOW (ref 79.5–101.0)
MONO%: 11.2 % (ref 0.0–14.0)
Platelets: 93 10*3/uL — ABNORMAL LOW (ref 145–400)
RBC: 4.75 10*6/uL (ref 3.70–5.45)
RDW: 14.7 % — ABNORMAL HIGH (ref 11.2–14.5)
nRBC: 0 % (ref 0–0)

## 2010-06-08 LAB — CBC WITH DIFFERENTIAL/PLATELET
BASO%: 0.6 % (ref 0.0–2.0)
Basophils Absolute: 0.1 10*3/uL (ref 0.0–0.1)
EOS%: 1.6 % (ref 0.0–7.0)
HGB: 12.8 g/dL (ref 11.6–15.9)
MCH: 26 pg (ref 25.1–34.0)
MCHC: 32.7 g/dL (ref 31.5–36.0)
MCV: 79.3 fL — ABNORMAL LOW (ref 79.5–101.0)
MONO%: 11.2 % (ref 0.0–14.0)
NEUT%: 52 % (ref 38.4–76.8)
RDW: 14.7 % — ABNORMAL HIGH (ref 11.2–14.5)
lymph#: 2.8 10*3/uL (ref 0.9–3.3)

## 2010-06-08 LAB — CHCC SMEAR

## 2010-06-15 LAB — CBC WITH DIFFERENTIAL/PLATELET
BASO%: 0.7 % (ref 0.0–2.0)
LYMPH%: 31.8 % (ref 14.0–49.7)
MCHC: 31.9 g/dL (ref 31.5–36.0)
MCV: 80.4 fL (ref 79.5–101.0)
MONO%: 10 % (ref 0.0–14.0)
NEUT#: 5.7 10*3/uL (ref 1.5–6.5)
Platelets: 51 10*3/uL — ABNORMAL LOW (ref 145–400)
RBC: 4.84 10*6/uL (ref 3.70–5.45)
RDW: 14.8 % — ABNORMAL HIGH (ref 11.2–14.5)
WBC: 10.2 10*3/uL (ref 3.9–10.3)
nRBC: 0 % (ref 0–0)

## 2010-06-22 ENCOUNTER — Ambulatory Visit: Payer: Self-pay | Admitting: Oncology

## 2010-06-22 LAB — CBC WITH DIFFERENTIAL/PLATELET
BASO%: 1 % (ref 0.0–2.0)
Basophils Absolute: 0.1 10*3/uL (ref 0.0–0.1)
EOS%: 1.4 % (ref 0.0–7.0)
MCH: 26 pg (ref 25.1–34.0)
MCHC: 32.6 g/dL (ref 31.5–36.0)
MCV: 79.7 fL (ref 79.5–101.0)
MONO%: 11.1 % (ref 0.0–14.0)
RBC: 4.77 10*6/uL (ref 3.70–5.45)
RDW: 14.6 % — ABNORMAL HIGH (ref 11.2–14.5)
lymph#: 2.5 10*3/uL (ref 0.9–3.3)
nRBC: 0 % (ref 0–0)

## 2010-06-22 LAB — CHCC SMEAR

## 2010-07-13 LAB — CBC WITH DIFFERENTIAL/PLATELET
BASO%: 0.7 % (ref 0.0–2.0)
EOS%: 3.4 % (ref 0.0–7.0)
LYMPH%: 34.7 % (ref 14.0–49.7)
MCH: 25.6 pg (ref 25.1–34.0)
MCHC: 32.4 g/dL (ref 31.5–36.0)
MONO#: 0.9 10*3/uL (ref 0.1–0.9)
MONO%: 10 % (ref 0.0–14.0)
NEUT%: 51.2 % (ref 38.4–76.8)
Platelets: 52 10*3/uL — ABNORMAL LOW (ref 145–400)
RBC: 4.73 10*6/uL (ref 3.70–5.45)
WBC: 8.8 10*3/uL (ref 3.9–10.3)
nRBC: 0 % (ref 0–0)

## 2010-08-02 ENCOUNTER — Ambulatory Visit: Payer: Self-pay | Admitting: Oncology

## 2010-08-03 LAB — COMPREHENSIVE METABOLIC PANEL
AST: 10 U/L (ref 0–37)
Albumin: 3.8 g/dL (ref 3.5–5.2)
Alkaline Phosphatase: 70 U/L (ref 39–117)
Potassium: 3.6 mEq/L (ref 3.5–5.3)
Sodium: 138 mEq/L (ref 135–145)
Total Protein: 6.7 g/dL (ref 6.0–8.3)

## 2010-08-03 LAB — CBC WITH DIFFERENTIAL/PLATELET
BASO%: 0.7 % (ref 0.0–2.0)
EOS%: 1.2 % (ref 0.0–7.0)
MCH: 25.4 pg (ref 25.1–34.0)
MCHC: 32.2 g/dL (ref 31.5–36.0)
RBC: 4.65 10*6/uL (ref 3.70–5.45)
RDW: 15.2 % — ABNORMAL HIGH (ref 11.2–14.5)
lymph#: 2 10*3/uL (ref 0.9–3.3)

## 2010-08-17 LAB — CBC WITH DIFFERENTIAL/PLATELET
Basophils Absolute: 0.1 10*3/uL (ref 0.0–0.1)
EOS%: 2.2 % (ref 0.0–7.0)
HGB: 12.9 g/dL (ref 11.6–15.9)
LYMPH%: 27.4 % (ref 14.0–49.7)
MCH: 25.7 pg (ref 25.1–34.0)
MCV: 79.4 fL — ABNORMAL LOW (ref 79.5–101.0)
MONO%: 9.4 % (ref 0.0–14.0)
Platelets: 77 10*3/uL — ABNORMAL LOW (ref 145–400)
RBC: 5.01 10*6/uL (ref 3.70–5.45)
RDW: 15.9 % — ABNORMAL HIGH (ref 11.2–14.5)

## 2010-09-01 ENCOUNTER — Ambulatory Visit: Payer: Self-pay | Admitting: Oncology

## 2010-09-01 LAB — COMPREHENSIVE METABOLIC PANEL
ALT: 11 U/L (ref 0–35)
AST: 20 U/L (ref 0–37)
Alkaline Phosphatase: 76 U/L (ref 39–117)
Sodium: 140 mEq/L (ref 135–145)
Total Bilirubin: 1 mg/dL (ref 0.3–1.2)
Total Protein: 7.4 g/dL (ref 6.0–8.3)

## 2010-09-01 LAB — CBC WITH DIFFERENTIAL/PLATELET
BASO%: 0.8 % (ref 0.0–2.0)
EOS%: 1.6 % (ref 0.0–7.0)
MCH: 25.8 pg (ref 25.1–34.0)
MCV: 79.5 fL (ref 79.5–101.0)
MONO%: 11.3 % (ref 0.0–14.0)
RBC: 4.88 10*6/uL (ref 3.70–5.45)
RDW: 15.7 % — ABNORMAL HIGH (ref 11.2–14.5)
nRBC: 0 % (ref 0–0)

## 2010-09-14 LAB — CBC WITH DIFFERENTIAL/PLATELET
Eosinophils Absolute: 0.3 10*3/uL (ref 0.0–0.5)
LYMPH%: 23.6 % (ref 14.0–49.7)
MCH: 26.2 pg (ref 25.1–34.0)
MCHC: 32.8 g/dL (ref 31.5–36.0)
MCV: 79.9 fL (ref 79.5–101.0)
MONO%: 8.6 % (ref 0.0–14.0)
NEUT#: 6.9 10*3/uL — ABNORMAL HIGH (ref 1.5–6.5)
Platelets: 73 10*3/uL — ABNORMAL LOW (ref 145–400)
RBC: 5.08 10*6/uL (ref 3.70–5.45)
nRBC: 0 % (ref 0–0)

## 2010-09-28 LAB — CBC WITH DIFFERENTIAL/PLATELET
Basophils Absolute: 0 10*3/uL (ref 0.0–0.1)
Eosinophils Absolute: 0.1 10*3/uL (ref 0.0–0.5)
HGB: 12.6 g/dL (ref 11.6–15.9)
LYMPH%: 33.8 % (ref 14.0–49.7)
MCV: 80.5 fL (ref 79.5–101.0)
MONO%: 10.2 % (ref 0.0–14.0)
NEUT#: 4.9 10*3/uL (ref 1.5–6.5)
Platelets: 116 10*3/uL — ABNORMAL LOW (ref 145–400)
RBC: 4.83 10*6/uL (ref 3.70–5.45)

## 2010-10-17 ENCOUNTER — Ambulatory Visit: Payer: Self-pay | Admitting: Oncology

## 2010-10-19 LAB — CBC WITH DIFFERENTIAL/PLATELET
BASO%: 0.5 % (ref 0.0–2.0)
EOS%: 2.4 % (ref 0.0–7.0)
HCT: 39.4 % (ref 34.8–46.6)
LYMPH%: 34.8 % (ref 14.0–49.7)
MCH: 26.5 pg (ref 25.1–34.0)
MCHC: 33.2 g/dL (ref 31.5–36.0)
MCV: 79.8 fL (ref 79.5–101.0)
NEUT%: 52.5 % (ref 38.4–76.8)
Platelets: 110 10*3/uL — ABNORMAL LOW (ref 145–400)

## 2010-11-02 LAB — CBC WITH DIFFERENTIAL/PLATELET
Basophils Absolute: 0 10*3/uL (ref 0.0–0.1)
Eosinophils Absolute: 0.1 10*3/uL (ref 0.0–0.5)
HCT: 40.5 % (ref 34.8–46.6)
HGB: 13.3 g/dL (ref 11.6–15.9)
LYMPH%: 32.7 % (ref 14.0–49.7)
MONO#: 0.7 10*3/uL (ref 0.1–0.9)
NEUT#: 4.5 10*3/uL (ref 1.5–6.5)
NEUT%: 56.1 % (ref 38.4–76.8)
Platelets: 49 10*3/uL — ABNORMAL LOW (ref 145–400)
RBC: 5.02 10*6/uL (ref 3.70–5.45)
WBC: 8 10*3/uL (ref 3.9–10.3)
nRBC: 0 % (ref 0–0)

## 2010-11-16 ENCOUNTER — Ambulatory Visit (HOSPITAL_BASED_OUTPATIENT_CLINIC_OR_DEPARTMENT_OTHER): Payer: BC Managed Care – PPO | Admitting: Oncology

## 2010-11-16 LAB — CBC WITH DIFFERENTIAL/PLATELET
Basophils Absolute: 0.1 10*3/uL (ref 0.0–0.1)
EOS%: 2.3 % (ref 0.0–7.0)
HCT: 41.1 % (ref 34.8–46.6)
HGB: 13.5 g/dL (ref 11.6–15.9)
MCH: 26.7 pg (ref 25.1–34.0)
NEUT%: 56.3 % (ref 38.4–76.8)
Platelets: 84 10*3/uL — ABNORMAL LOW (ref 145–400)
lymph#: 2.6 10*3/uL (ref 0.9–3.3)

## 2010-11-30 LAB — CBC WITH DIFFERENTIAL/PLATELET
BASO%: 0.7 % (ref 0.0–2.0)
Basophils Absolute: 0.1 10*3/uL (ref 0.0–0.1)
EOS%: 1.4 % (ref 0.0–7.0)
Eosinophils Absolute: 0.1 10*3/uL (ref 0.0–0.5)
HCT: 39.5 % (ref 34.8–46.6)
HGB: 13.1 g/dL (ref 11.6–15.9)
LYMPH%: 31.4 % (ref 14.0–49.7)
MCH: 26.8 pg (ref 25.1–34.0)
MCHC: 33.2 g/dL (ref 31.5–36.0)
MCV: 80.9 fL (ref 79.5–101.0)
MONO#: 0.6 10*3/uL (ref 0.1–0.9)
MONO%: 7.8 % (ref 0.0–14.0)
NEUT#: 4.3 10*3/uL (ref 1.5–6.5)
NEUT%: 58.7 % (ref 38.4–76.8)
Platelets: 50 10*3/uL — ABNORMAL LOW (ref 145–400)
RBC: 4.88 10*6/uL (ref 3.70–5.45)
RDW: 15.8 % — ABNORMAL HIGH (ref 11.2–14.5)
WBC: 7.3 10*3/uL (ref 3.9–10.3)
lymph#: 2.3 10*3/uL (ref 0.9–3.3)
nRBC: 0 % (ref 0–0)

## 2010-11-30 LAB — COMPREHENSIVE METABOLIC PANEL
ALT: 13 U/L (ref 0–35)
AST: 20 U/L (ref 0–37)
Albumin: 3.7 g/dL (ref 3.5–5.2)
Alkaline Phosphatase: 75 U/L (ref 39–117)
BUN: 4 mg/dL — ABNORMAL LOW (ref 6–23)
CO2: 27 mEq/L (ref 19–32)
Calcium: 9.3 mg/dL (ref 8.4–10.5)
Chloride: 99 mEq/L (ref 96–112)
Creatinine, Ser: 0.76 mg/dL (ref 0.40–1.20)
Glucose, Bld: 108 mg/dL — ABNORMAL HIGH (ref 70–99)
Potassium: 3.5 mEq/L (ref 3.5–5.3)
Sodium: 136 mEq/L (ref 135–145)
Total Bilirubin: 0.6 mg/dL (ref 0.3–1.2)
Total Protein: 7.2 g/dL (ref 6.0–8.3)

## 2010-12-28 ENCOUNTER — Encounter: Payer: BC Managed Care – PPO | Admitting: Oncology

## 2010-12-28 DIAGNOSIS — D693 Immune thrombocytopenic purpura: Secondary | ICD-10-CM

## 2010-12-28 LAB — CBC WITH DIFFERENTIAL/PLATELET
BASO%: 0.1 % (ref 0.0–2.0)
EOS%: 2.6 % (ref 0.0–7.0)
HCT: 41.2 % (ref 34.8–46.6)
LYMPH%: 36.6 % (ref 14.0–49.7)
MCH: 27.4 pg (ref 25.1–34.0)
MCHC: 32.4 g/dL (ref 31.5–36.0)
NEUT%: 51.9 % (ref 38.4–76.8)
Platelets: 57 10*3/uL — ABNORMAL LOW (ref 145–400)
lymph#: 3.6 10*3/uL — ABNORMAL HIGH (ref 0.9–3.3)

## 2011-02-01 ENCOUNTER — Other Ambulatory Visit: Payer: Self-pay | Admitting: Physician Assistant

## 2011-02-01 ENCOUNTER — Encounter (HOSPITAL_BASED_OUTPATIENT_CLINIC_OR_DEPARTMENT_OTHER): Payer: BC Managed Care – PPO | Admitting: Oncology

## 2011-02-01 DIAGNOSIS — D693 Immune thrombocytopenic purpura: Secondary | ICD-10-CM

## 2011-02-01 LAB — CBC WITH DIFFERENTIAL/PLATELET
Basophils Absolute: 0.1 10*3/uL (ref 0.0–0.1)
EOS%: 2.4 % (ref 0.0–7.0)
HGB: 12.7 g/dL (ref 11.6–15.9)
LYMPH%: 32.7 % (ref 14.0–49.7)
MCH: 27.4 pg (ref 25.1–34.0)
MCV: 81.9 fL (ref 79.5–101.0)
MONO%: 11 % (ref 0.0–14.0)
RBC: 4.63 10*6/uL (ref 3.70–5.45)
RDW: 15.3 % — ABNORMAL HIGH (ref 11.2–14.5)

## 2011-02-15 LAB — DIFFERENTIAL
Basophils Absolute: 0 K/uL (ref 0.0–0.1)
Basophils Relative: 0 % (ref 0–1)
Basophils Relative: 0 % (ref 0–1)
Basophils Relative: 0 % (ref 0–1)
Eosinophils Absolute: 0 10*3/uL (ref 0.0–0.7)
Eosinophils Absolute: 0.1 10*3/uL (ref 0.0–0.7)
Eosinophils Relative: 0 % (ref 0–5)
Eosinophils Relative: 0 % (ref 0–5)
Eosinophils Relative: 1 % (ref 0–5)
Lymphocytes Relative: 2 % — ABNORMAL LOW (ref 12–46)
Lymphocytes Relative: 32 % (ref 12–46)
Lymphs Abs: 1.4 10*3/uL (ref 0.7–4.0)
Lymphs Abs: 4.1 K/uL — ABNORMAL HIGH (ref 0.7–4.0)
Monocytes Absolute: 0.1 10*3/uL (ref 0.1–1.0)
Monocytes Absolute: 1.2 K/uL — ABNORMAL HIGH (ref 0.1–1.0)
Monocytes Relative: 1 % — ABNORMAL LOW (ref 3–12)
Monocytes Relative: 9 % (ref 3–12)
Neutro Abs: 32.6 10*3/uL — ABNORMAL HIGH (ref 1.7–7.7)
Neutro Abs: 7.5 10*3/uL (ref 1.7–7.7)
Neutrophils Relative %: 58 % (ref 43–77)
Neutrophils Relative %: 96 % — ABNORMAL HIGH (ref 43–77)

## 2011-02-15 LAB — SAMPLE TO BLOOD BANK

## 2011-02-15 LAB — CBC
HCT: 36.8 % (ref 36.0–46.0)
Hemoglobin: 11.6 g/dL — ABNORMAL LOW (ref 12.0–15.0)
Hemoglobin: 11.9 g/dL — ABNORMAL LOW (ref 12.0–15.0)
MCHC: 32.3 g/dL (ref 30.0–36.0)
MCHC: 32.4 g/dL (ref 30.0–36.0)
MCV: 82.7 fL (ref 78.0–100.0)
MCV: 82.8 fL (ref 78.0–100.0)
Platelets: 34 10*3/uL — ABNORMAL LOW (ref 150–400)
Platelets: 536 K/uL — ABNORMAL HIGH (ref 150–400)
RBC: 4.3 MIL/uL (ref 3.87–5.11)
RBC: 4.44 MIL/uL (ref 3.87–5.11)
RDW: 16 % — ABNORMAL HIGH (ref 11.5–15.5)
RDW: 16.2 % — ABNORMAL HIGH (ref 11.5–15.5)
WBC: 10.4 10*3/uL (ref 4.0–10.5)
WBC: 12.9 10*3/uL — ABNORMAL HIGH (ref 4.0–10.5)
WBC: 34 10*3/uL — ABNORMAL HIGH (ref 4.0–10.5)

## 2011-02-15 LAB — PROTIME-INR
INR: 1.08 (ref 0.00–1.49)
Prothrombin Time: 13.4 seconds (ref 11.6–15.2)
Prothrombin Time: 13.9 seconds (ref 11.6–15.2)

## 2011-03-03 ENCOUNTER — Other Ambulatory Visit: Payer: Self-pay | Admitting: Physician Assistant

## 2011-03-03 ENCOUNTER — Encounter (HOSPITAL_BASED_OUTPATIENT_CLINIC_OR_DEPARTMENT_OTHER): Payer: BC Managed Care – PPO | Admitting: Oncology

## 2011-03-03 DIAGNOSIS — D693 Immune thrombocytopenic purpura: Secondary | ICD-10-CM

## 2011-03-03 LAB — CBC WITH DIFFERENTIAL/PLATELET
Basophils Absolute: 0 10*3/uL (ref 0.0–0.1)
Eosinophils Absolute: 0.1 10*3/uL (ref 0.0–0.5)
HGB: 13.2 g/dL (ref 11.6–15.9)
MCV: 81.6 fL (ref 79.5–101.0)
MONO%: 11.5 % (ref 0.0–14.0)
NEUT#: 5.2 10*3/uL (ref 1.5–6.5)
RBC: 4.9 10*6/uL (ref 3.70–5.45)
RDW: 14.6 % — ABNORMAL HIGH (ref 11.2–14.5)
WBC: 8.3 10*3/uL (ref 3.9–10.3)
lymph#: 2.1 10*3/uL (ref 0.9–3.3)
nRBC: 0 % (ref 0–0)

## 2011-03-28 ENCOUNTER — Other Ambulatory Visit: Payer: Self-pay | Admitting: Physician Assistant

## 2011-03-28 ENCOUNTER — Encounter (HOSPITAL_BASED_OUTPATIENT_CLINIC_OR_DEPARTMENT_OTHER): Payer: BC Managed Care – PPO | Admitting: Oncology

## 2011-03-28 DIAGNOSIS — D693 Immune thrombocytopenic purpura: Secondary | ICD-10-CM

## 2011-03-28 LAB — CBC WITH DIFFERENTIAL/PLATELET
BASO%: 0.5 % (ref 0.0–2.0)
Eosinophils Absolute: 0.2 10*3/uL (ref 0.0–0.5)
HCT: 39.8 % (ref 34.8–46.6)
MCHC: 33.4 g/dL (ref 31.5–36.0)
MONO#: 1 10*3/uL — ABNORMAL HIGH (ref 0.1–0.9)
NEUT#: 7 10*3/uL — ABNORMAL HIGH (ref 1.5–6.5)
NEUT%: 63.6 % (ref 38.4–76.8)
Platelets: 76 10*3/uL — ABNORMAL LOW (ref 145–400)
RBC: 4.9 10*6/uL (ref 3.70–5.45)
WBC: 11 10*3/uL — ABNORMAL HIGH (ref 3.9–10.3)
lymph#: 2.8 10*3/uL (ref 0.9–3.3)
nRBC: 0 % (ref 0–0)

## 2011-04-11 NOTE — Discharge Summary (Signed)
[name], [name]              ACCOUNT NO.:  [id number]   MEDICAL RECORD NO.:  [id number]          PATIENT TYPE:  INP   LOCATION:  1319                         FACILITY:  [facility]   PHYSICIAN:  Pierce Crane, MD        DATE OF BIRTH:  [birth year]   DATE OF ADMISSION:  08/10/2008  DATE OF DISCHARGE:  08/13/2008                               DISCHARGE SUMMARY   DISCHARGE DIAGNOSIS:  Idiopathic thrombocytopenic purpura.   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  She has chronic thrombocytopenia with platelet count  from the 40 to 60 range and sometimes as high as 85,000.  She presented  with a fever to 101 with a viral-type illness as well as heavy menses.  She was noted to have a platelet count of less than 6000, was admitted  to the hospital.  Initial evaluation and appropriate physical  examination was done and relevant findings are found in the H&P.  Patient was admitted to the hospital and placed on Solu-Medrol 100 mg IV  q.6h., IVIg 1 gm/kg, total dose 80 gm daily for three days and Amicar 1  gm p.o. q.i.d.   COURSE IN THE HOSPITAL:  Patient was followed in the hospital.  She did well.  On day #2 of  admission, her platelet count had risen to 10,000, subsequently it rose  to 134 on the day of discharge.  She had no difficulties with taking the  IVIg and tolerating the Solu-Medrol well.  She was converted to  prednisone 80 mg daily on the day of discharge.  Amicar is discontinued  after her platelet count rose above 20,000.  Her menses normalized, then  stopped.  She was felt to be stable for discharge on August 13, 2008.   DISCHARGE MEDICATIONS:  1. Prednisone 80 mg a day with taper.  2. Amicar 400 mg a day for seven days.  Patient will follow up in the      outpatient clinic on a weekly basis to monitor platelet counts.   DISCHARGE:  Patient discharged from the hospital in stable condition.   PROGNOSIS:  Good.      Pierce Crane, MD  Electronically Signed     PR/MEDQ  D:  08/13/2008  T:  08/14/2008  Job:  [id number]

## 2011-04-11 NOTE — Discharge Summary (Signed)
NAMEBRITTIN, [name]              ACCOUNT NO.:  [id number]   MEDICAL RECORD NO.:  [id number]          PATIENT TYPE:  INP   LOCATION:  1319                         FACILITY:  [facility]   PHYSICIAN:  Pierce Crane, MD        DATE OF BIRTH:  1981-04-21   DATE OF ADMISSION:  08/10/2008  DATE OF DISCHARGE:  08/13/2008                               DISCHARGE SUMMARY   DISCHARGE DIAGNOSIS:  Idiopathic thrombocytopenic purpura.   HISTORY OF PRESENT ILLNESS:  This is a pleasant 30 year old woman with  well-known history of ITP, chronic, status post colectomy.  She  chronically has low blood counts and low platelet counts in the 40-60  range.  She presented with heavy menstrual bleeding concurrent with  being on Biaxin for bronchitis.  She had a white count of 4.3 on  presentation.  Hemoglobin was 13.5 and platelet count of less than 6000.  She was admitted to the hospital.   On admission she was afebrile.  Blood pressure was 113/50, temperature  was 98, pulse 78, respiratory rate 16.  She was initiated on IV IG 1  g/kg daily for three days and also was started on Solu-Medrol 100 mg IV  q.6.  On August 12, 2008 the platelets had risen to 10,000.  Subsequently the platelets rose to 74,000 and 134,000 on day of  discharge.  Her white count that day was 21, hemoglobin was 11.3.  She  was initially started on Amicar which was discontinued.  She was sent  home on the following medications:  1. Prednisone 80 mg daily with instructions to taper.  2. Avelox 400 mg daily for seven days.   The patient was discharged from the hospital in stable condition.   PROGNOSIS:  Good.   FOLLOWUP:  She will follow up in the outpatient clinic for weekly CBCs.      Pierce Crane, MD  Electronically Signed     PR/MEDQ  D:  09/16/2008  T:  09/16/2008  Job:  [id number]

## 2011-04-11 NOTE — H&P (Signed)
[name], CISNERO              ACCOUNT NO.:  [id number]   MEDICAL RECORD NO.:  [id number]          PATIENT TYPE:  INP   LOCATION:  1319                         FACILITY:  [facility]   PHYSICIAN:  Pierce Crane, MD        DATE OF BIRTH:  September 23, 1981   DATE OF ADMISSION:  08/10/2008  DATE OF DISCHARGE:                              HISTORY & PHYSICAL   SUBJECTIVE/HISTORY OF PRESENT ILLNESS:  [name] is a delightful 30-year-  old [place], [place], woman, who is well known to our office  due to her history of idiopathic thrombocytopenic purpura.  She has been  monitored closely, her last labs on July 14, 2008, showing her  platelet count at 85,000, and this has been actually fairly consistent  over the last several months.  She contacted our office today stating  that over the weekend she developed a fever up to 102.1.  She was seen  in a local urgent care, diagnosed with a viral or bacterial bronchitis  and placed on Biaxin.  She also started her menstrual period and has had  extremely heavy flow; pretty much as soon as she would insert a tampon  she would bleed right through it. She has also had episodes of  dizziness, therefore, wished to have her CBC checked in our office  today.  She presented to our office.  A CBC was obtained.  Hemoglobin  was 13.5 grams, but the platelet count was less than 6,000.  WBC was at  4300 with an ANC of 1600.  On the peripheral smear, large platelets were  appreciated.  Vital signs were stable with a blood pressure of 117/81,  pulse rate of 101, but her temperature was 100.6.  She states that she  has had no headaches or frank vision changes, but again she does get  quite dizzy with quick changes in position.  She denies any epistaxis.  She does have gingival bleeding with brushing her teeth.  No frank  hemoptysis otherwise.  No hematochezia or melenic stools.  No obvious  easy bruisability.  Excluding the Biaxin and Mucinex, she has not been  taking any new medications.  She is not on any chronic medications.  No  other febrile illness except up to the past 4 days.  She occasionally  has episodes of nausea but denies any emesis.  No diarrhea or  constipation episodes.   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  She has been treated with  prednisone, IVIG, Rituxan, WinRho.  Most recently she has had a course  of prednisone though she has tapered several weeks ago.  Again as noted  on July 14, 2008, platelet count was 85,000.   FAMILY MEDICAL HISTORY:  Significant for diabetes but no known blood  disorders.   SOCIAL HISTORY:  [name] is currently working in [place].  She lives  here in [place] with her significant other.  He is present with her  today.  No history of tobacco or alcohol consumption.   REVIEW OF SYSTEMS:  As  per HPI, to include the patient denies any vision  changes, hearing loss, odynophagia or dysphagia.  No unusual bone pain,  joint discomfort.  No known insect bite exposure.   OBJECTIVE/PHYSICAL EXAMINATION:  VITAL SIGNS:  Blood pressure is 117/81,  pulse 101, respirations 18, temperature 100.6.  Weight was not obtained.  HEENT:  Conjunctivae pink, sclerae anicteric.  Nares are free of any  active bleeding.  Oropharynx does show a little bit of petechial rash on  the hard palate.  No purpuric lesions.  No active gingival bleeding.  LUNGS:  Actually pretty clear to auscultation bilaterally.  HEART:  Regular rate and rhythm.  ABDOMEN:  Soft.  Normal bowel sounds.  EXTREMITIES:  Benign.   LABORATORY DATA:  As per HPI.   IMPRESSION/RECOMMENDATIONS:  1. Severe thrombocytopenia with extremely heavy menstrual flow.      Stable hemoglobin and hematocrit and white blood cell count.  2. Recent febrile illness, currently on Biaxin.   The case has been reviewed with Dr. Donnie Coffin, who agrees with the patient's  admission.   We will  discontinue the Biaxin at this point.  We will start her on Solu-  Medrol 100 mg IV q.6 h, Amicar 1 gram q.i.d., and high-dose IVIG at 1  gram per kilogram with appropriate premeds.  We will monitor her CBC  closely.  Rennae understands and agrees with this plan.      Pierce Crane, MD  Electronically Signed     Pierce Crane, MD  Electronically Signed    PR/MEDQ  D:  08/10/2008  T:  08/11/2008  Job:  [id number]

## 2011-04-14 NOTE — Op Note (Signed)
[name], [name]              ACCOUNT NO.:  [id number]   MEDICAL RECORD NO.:  [id number]          PATIENT TYPE:  INP   LOCATION:  0358                         [facility]ACILITY[facility]  [facility]   PHYSICIAN:  Vikki Ports, MDDATE OF BIRTH:  1980/12/16   DATE OF PROCEDURE:  02/20/2005  DATE OF DISCHARGE:                                 OPERATIVE REPORT   PREOPERATIVE DIAGNOSIS:  Idiopathic thrombocytopenic purpura.   PROCEDURE:  Laparoscopic splenectomy.   SURGEON:  Vikki Ports, MD   ASSISTANT:  Lorne Skeens. Hoxworth, M.D.   ANESTHESIA:  General.   DESCRIPTION:  Patient was taken to the operating room.  After general  anesthesia was induced, she was placed in the right lateral decubitus  position.  Abdomen was prepped and draped in the normal sterile fashion.  Using the Optiview technique in the mid abdomen, pneumoperitoneum was  obtained under direct visualization.  Additional 12 mm trocars were placed.  Dissection was undertaken, taking down the splenic flexure and all splenic  attachments to the spleen.  Short gastrics were taken down on the superior  portion of the spleen.  The hilum was completely skeletonized and transected  using a white-load GIA stapling device, and the spleen was removed.  Adequate hemostasis was insured, and it was placed in an EndoCatch bag.  It  was morcellated and using a ring forceps, brought out through the skin but  was inside the bag.  After the entire spleen had been morcellated and  removed, I reinspected the tail of the pancreas and the hilum of the spleen.  Adequate hemostasis was insured.  Trocars were removed.  Skin was closed  with staples. The large fascial defect with a 15 mm bag was placed.  It was  closed with a figure-of-eight U6 0 Vicryl suture.  Incisions were injected  with Marcaine.  Patient tolerated the procedure well and went to the PACU in  good condition.      KRH/MEDQ  D:  02/20/2005  T:  02/20/2005  Job:  [id number]

## 2011-05-09 ENCOUNTER — Other Ambulatory Visit: Payer: Self-pay | Admitting: Obstetrics and Gynecology

## 2011-05-09 ENCOUNTER — Other Ambulatory Visit (HOSPITAL_COMMUNITY)
Admission: RE | Admit: 2011-05-09 | Discharge: 2011-05-09 | Disposition: A | Discharge status: Home / Self Care | Payer: BC Managed Care – PPO | Source: Ambulatory Visit | Attending: Obstetrics and Gynecology | Admitting: Obstetrics and Gynecology

## 2011-05-09 DIAGNOSIS — Z01419 Encounter for gynecological examination (general) (routine) without abnormal findings: Secondary | ICD-10-CM | POA: Insufficient documentation

## 2011-05-09 IMAGING — CT CT HEAD W/O CM
1 series · 16 of 30 positions shown, 20 images · non-contrast
Comparison: None.

CLINICAL DATA: Headache.

CT HEAD WITHOUT CONTRAST
TECHNIQUE: Contiguous axial images were obtained from the base of
the skull through the vertex without contrast.

[Series 2: head_seq 4.5 h37s st · axial · 0.43mm/px · z∈[-134,-8]mm · 16 of 32 slices shown, 20 images]
[im 2/32  brain]
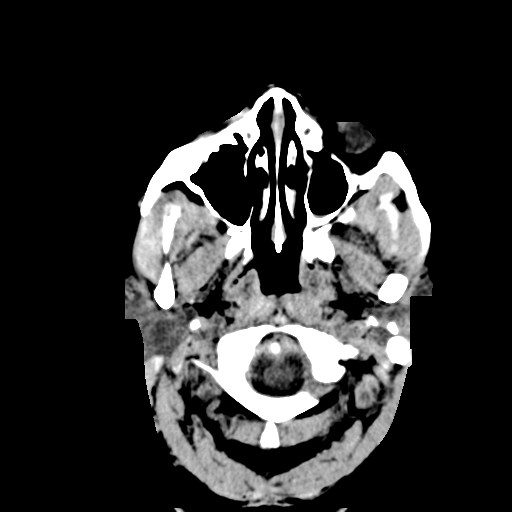
[im 2/32  bone]
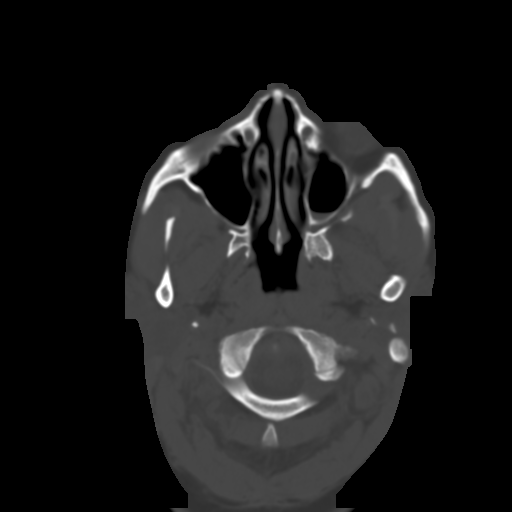
[im 4/32  brain]
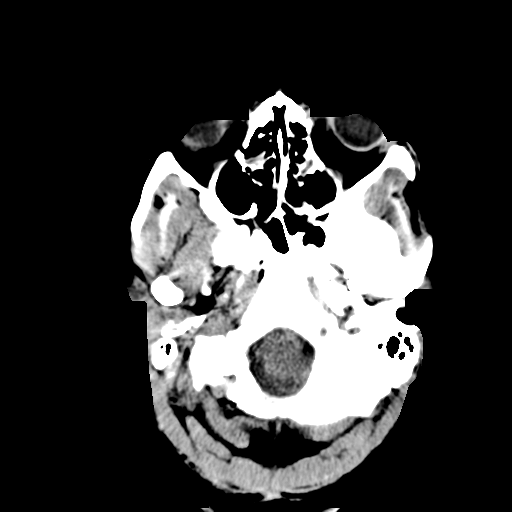
[im 6/32  brain]
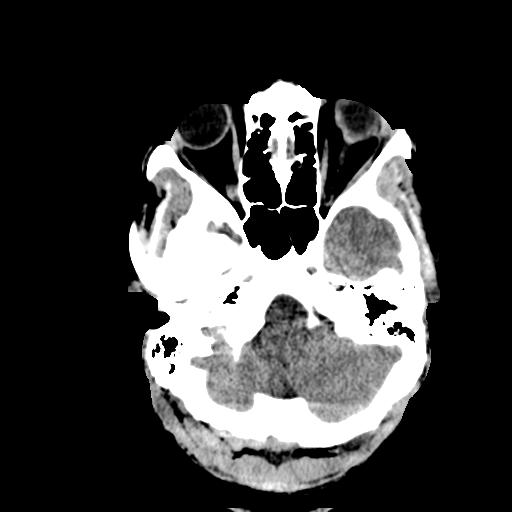
[im 8/32  brain]
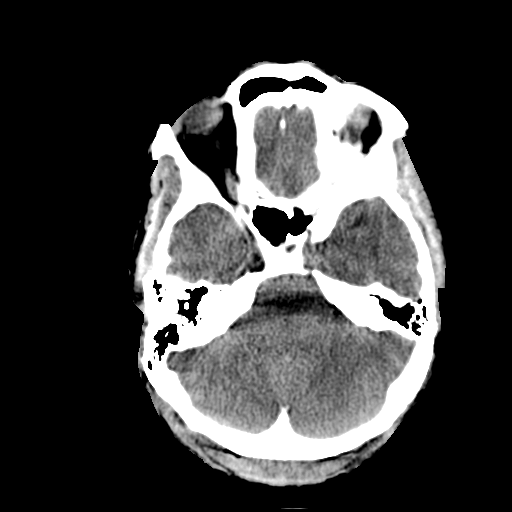
[im 9/32  brain]
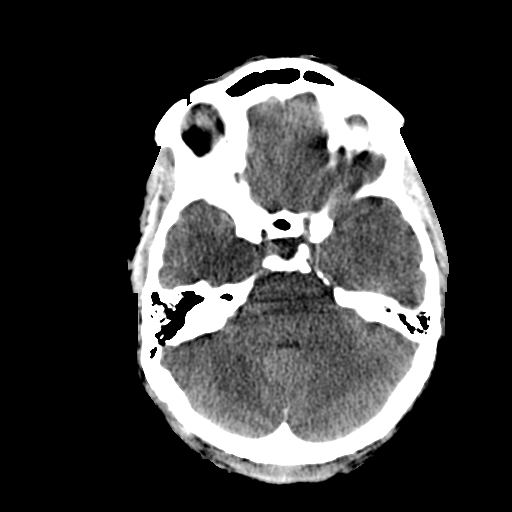
[im 9/32  bone]
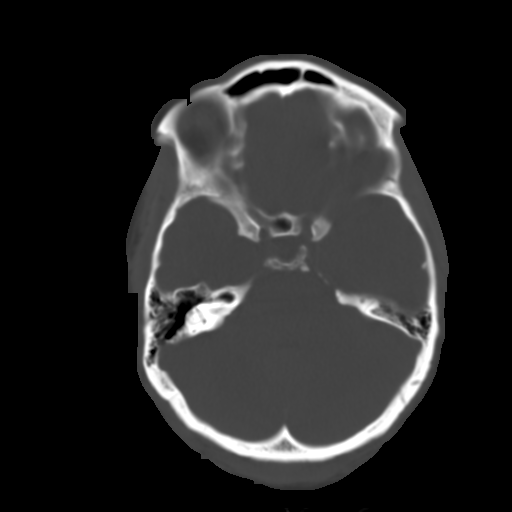
[im 11/32  brain]
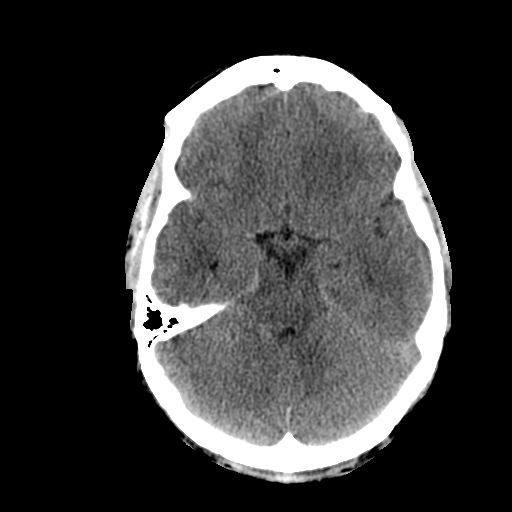
[im 13/32  brain]
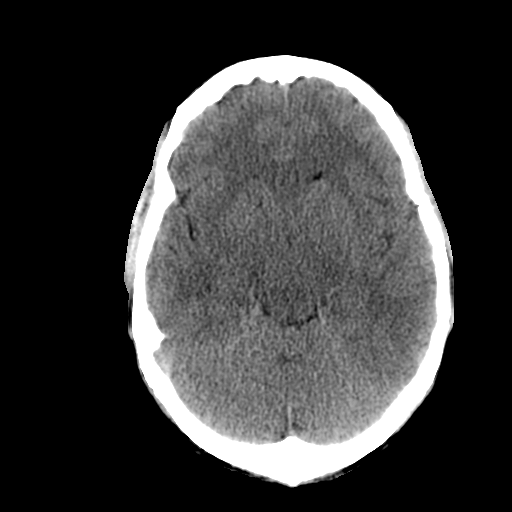
[im 15/32  brain]
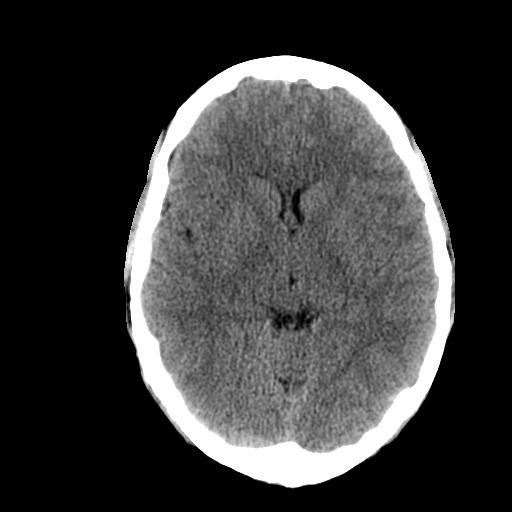
[im 17/32  brain]
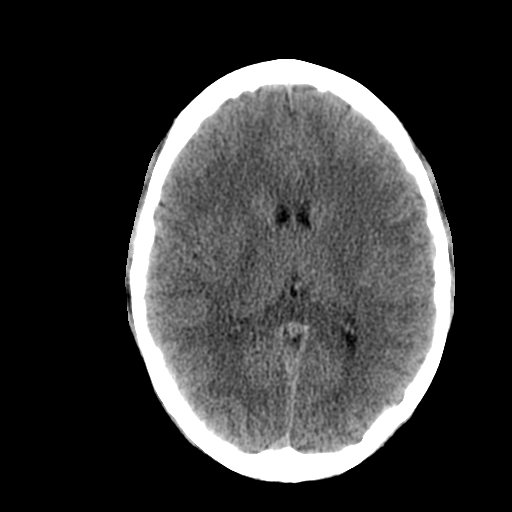
[im 17/32  bone]
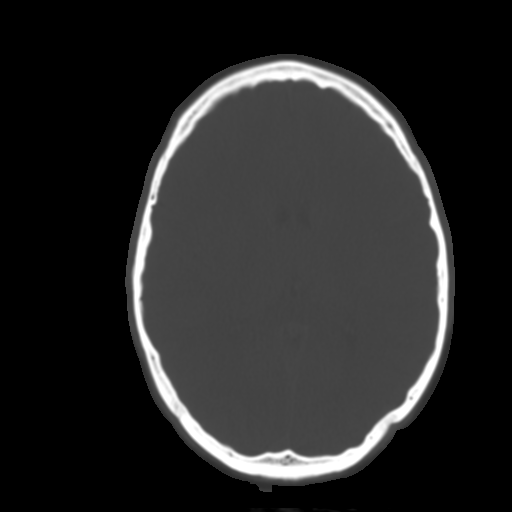
[im 19/32  brain]
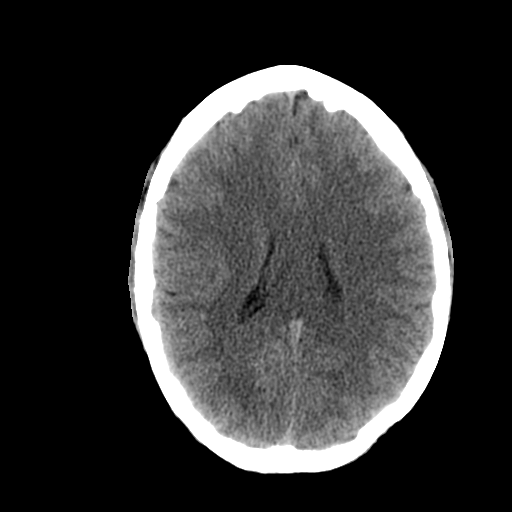
[im 21/32  brain]
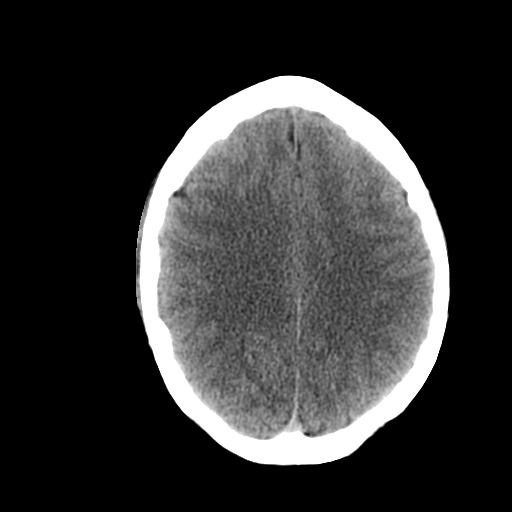
[im 23/32  brain]
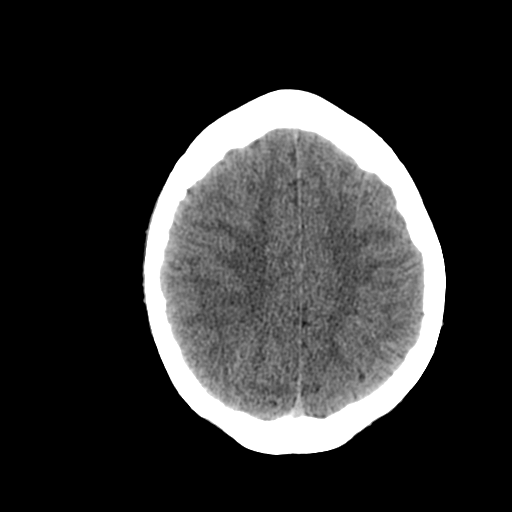
[im 24/32  brain]
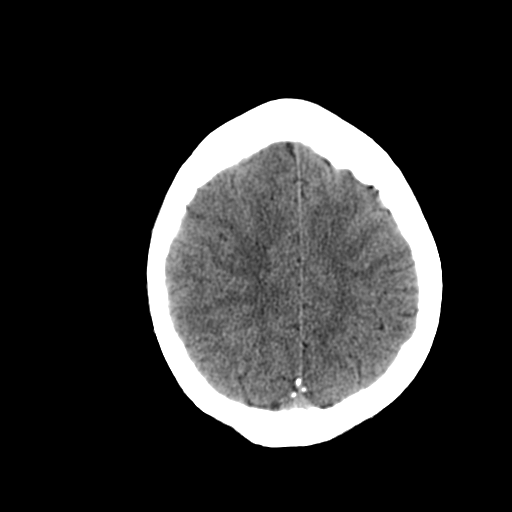
[im 24/32  bone]
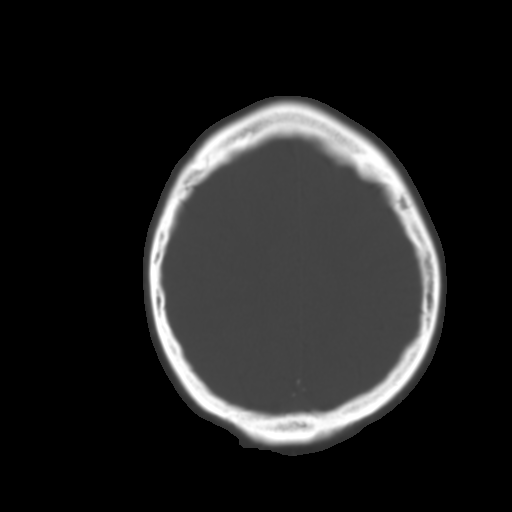
[im 26/32  brain]
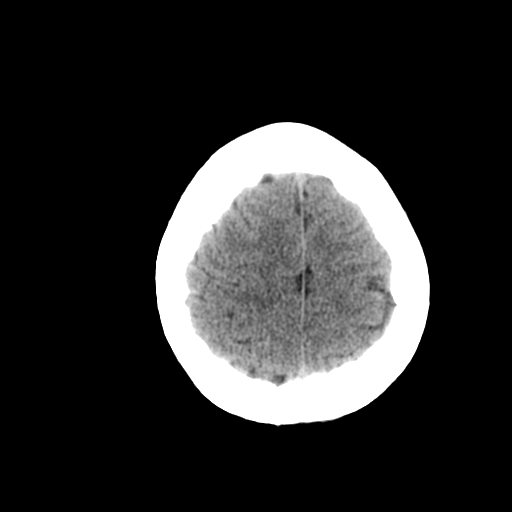
[im 28/32  brain]
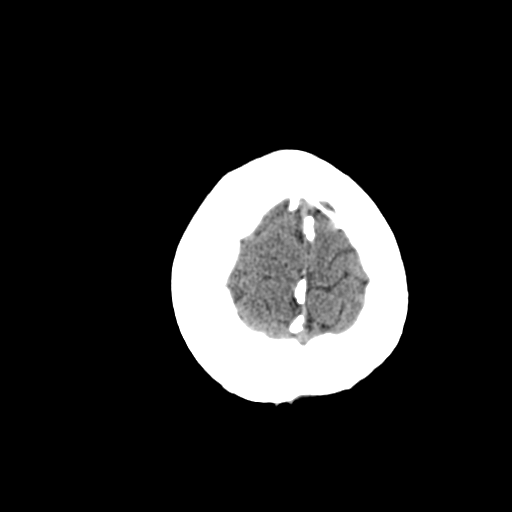
[im 30/32  brain]
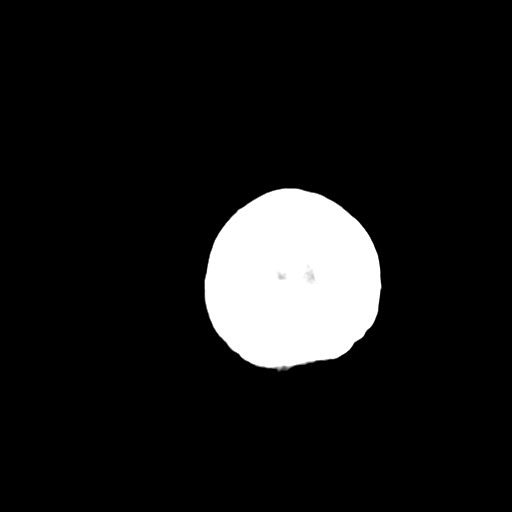

[16 of 30 positions shown; findings below may reference images not displayed]

FINDINGS: The brain appears normal without evidence of acute
infarction, hemorrhage, mass lesion, mass effect, midline shift or
abnormal extra-axial fluid collection.  No hydrocephalus.  Short
air fluid level in the left maxillary sinus is partially
visualized. There are some frothy secretions in the ethmoid air
cells.
IMPRESSION: 1.  No acute intracranial abnormality.
2.  Findings compatible with acute left maxillary sinusitis.
Ethmoid air cell disease also noted.

## 2011-05-30 ENCOUNTER — Other Ambulatory Visit: Payer: Self-pay | Admitting: Physician Assistant

## 2011-05-30 ENCOUNTER — Encounter (HOSPITAL_BASED_OUTPATIENT_CLINIC_OR_DEPARTMENT_OTHER): Payer: BC Managed Care – PPO | Admitting: Oncology

## 2011-05-30 DIAGNOSIS — D693 Immune thrombocytopenic purpura: Secondary | ICD-10-CM

## 2011-05-30 LAB — CBC WITH DIFFERENTIAL/PLATELET
Basophils Absolute: 0.1 10*3/uL (ref 0.0–0.1)
Eosinophils Absolute: 0.2 10*3/uL (ref 0.0–0.5)
HCT: 38.9 % (ref 34.8–46.6)
HGB: 12.8 g/dL (ref 11.6–15.9)
MONO#: 0.8 10*3/uL (ref 0.1–0.9)
NEUT#: 6 10*3/uL (ref 1.5–6.5)
NEUT%: 64 % (ref 38.4–76.8)
RDW: 15 % — ABNORMAL HIGH (ref 11.2–14.5)
lymph#: 2.3 10*3/uL (ref 0.9–3.3)

## 2011-07-05 ENCOUNTER — Encounter (HOSPITAL_BASED_OUTPATIENT_CLINIC_OR_DEPARTMENT_OTHER): Payer: BC Managed Care – PPO | Admitting: Oncology

## 2011-07-05 ENCOUNTER — Other Ambulatory Visit: Payer: Self-pay | Admitting: Physician Assistant

## 2011-07-05 DIAGNOSIS — D693 Immune thrombocytopenic purpura: Secondary | ICD-10-CM

## 2011-07-05 LAB — CBC WITH DIFFERENTIAL/PLATELET
Basophils Absolute: 0 10*3/uL (ref 0.0–0.1)
Eosinophils Absolute: 0.2 10*3/uL (ref 0.0–0.5)
HCT: 41.4 % (ref 34.8–46.6)
HGB: 13.3 g/dL (ref 11.6–15.9)
LYMPH%: 27.4 % (ref 14.0–49.7)
MONO#: 0.6 10*3/uL (ref 0.1–0.9)
NEUT#: 3.7 10*3/uL (ref 1.5–6.5)
NEUT%: 58.9 % (ref 38.4–76.8)
Platelets: 71 10*3/uL — ABNORMAL LOW (ref 145–400)
RBC: 4.95 10*6/uL (ref 3.70–5.45)
WBC: 6.3 10*3/uL (ref 3.9–10.3)
nRBC: 0 % (ref 0–0)

## 2011-08-28 LAB — COMPREHENSIVE METABOLIC PANEL
ALT: 11
AST: 19
CO2: 24
Calcium: 8.6
Creatinine, Ser: 0.76
GFR calc Af Amer: >60
GFR calc non Af Amer: >60
Glucose, Bld: 148 — ABNORMAL HIGH
Sodium: 138
Total Protein: 8.5 — ABNORMAL HIGH

## 2011-08-28 LAB — DIFFERENTIAL
Basophils Relative: 0
Eosinophils Relative: 0
Lymphs Abs: 0.6 — ABNORMAL LOW
Monocytes Absolute: 0.1
Monocytes Relative: 2 — ABNORMAL LOW
Neutro Abs: 2.4

## 2011-08-28 LAB — CBC
HCT: 36
HCT: 39.7
Hemoglobin: 11.8 — ABNORMAL LOW
Hemoglobin: 13
MCHC: 32.7
MCHC: 32.7
MCHC: 32.9
MCV: 84.9
MCV: 85.4
MCV: 85.5
Platelets: 10 — CL
Platelets: 134 — ABNORMAL LOW
RBC: 4.05
RBC: 4.22
RBC: 4.67
RDW: 14.6
RDW: 14.8
RDW: 14.9
WBC: 3.1 — ABNORMAL LOW

## 2011-09-06 ENCOUNTER — Other Ambulatory Visit: Payer: Self-pay | Admitting: Physician Assistant

## 2011-09-06 ENCOUNTER — Encounter (HOSPITAL_BASED_OUTPATIENT_CLINIC_OR_DEPARTMENT_OTHER): Payer: BC Managed Care – PPO | Admitting: Oncology

## 2011-09-06 DIAGNOSIS — D693 Immune thrombocytopenic purpura: Secondary | ICD-10-CM

## 2011-09-06 LAB — CBC WITH DIFFERENTIAL/PLATELET
BASO%: 0.6 % (ref 0.0–2.0)
LYMPH%: 36.8 % (ref 14.0–49.7)
MCHC: 32.9 g/dL (ref 31.5–36.0)
MONO#: 0.9 10*3/uL (ref 0.1–0.9)
Platelets: 67 10*3/uL — ABNORMAL LOW (ref 145–400)
RBC: 4.85 10*6/uL (ref 3.70–5.45)
RDW: 14.6 % — ABNORMAL HIGH (ref 11.2–14.5)
WBC: 8 10*3/uL (ref 3.9–10.3)
nRBC: 0 % (ref 0–0)

## 2011-10-09 ENCOUNTER — Telehealth: Payer: Self-pay | Admitting: *Deleted

## 2011-10-09 NOTE — Telephone Encounter (Signed)
CALLED PATIENT MOVED PATIENT APPOINTMENT TO 11-2011. PATIENT CONFIRMED OVER THE PHONE THE NEW DATE AND TIME PATIENT WILL STILL COME IN AND GET HER LAB DONE IN 10-2011.

## 2011-11-08 ENCOUNTER — Other Ambulatory Visit: Payer: Self-pay

## 2011-11-08 ENCOUNTER — Other Ambulatory Visit (HOSPITAL_BASED_OUTPATIENT_CLINIC_OR_DEPARTMENT_OTHER): Payer: BC Managed Care – PPO | Admitting: Lab

## 2011-11-08 DIAGNOSIS — D693 Immune thrombocytopenic purpura: Secondary | ICD-10-CM

## 2011-11-08 LAB — CBC WITH DIFFERENTIAL/PLATELET
BASO%: 0.5 % (ref 0.0–2.0)
EOS%: 1 % (ref 0.0–7.0)
HGB: 12.8 g/dL (ref 11.6–15.9)
MCH: 26.5 pg (ref 25.1–34.0)
MCHC: 33 g/dL (ref 31.5–36.0)
RDW: 14.9 % — ABNORMAL HIGH (ref 11.2–14.5)
WBC: 8.1 10*3/uL (ref 3.9–10.3)
lymph#: 2.6 10*3/uL (ref 0.9–3.3)

## 2011-12-05 ENCOUNTER — Other Ambulatory Visit: Payer: BC Managed Care – PPO | Admitting: Lab

## 2011-12-05 ENCOUNTER — Ambulatory Visit: Payer: BC Managed Care – PPO | Admitting: Physician Assistant

## 2011-12-06 ENCOUNTER — Other Ambulatory Visit: Payer: BC Managed Care – PPO | Admitting: Lab

## 2011-12-06 ENCOUNTER — Ambulatory Visit: Payer: BC Managed Care – PPO | Admitting: Physician Assistant

## 2011-12-08 ENCOUNTER — Telehealth: Payer: Self-pay | Admitting: Oncology

## 2011-12-08 NOTE — Telephone Encounter (Signed)
Pt called today to r/s 1/9 appt to 2/27 (date per pt). Pt given appt for 2/27 @ 2:45 pm.

## 2012-01-24 ENCOUNTER — Other Ambulatory Visit: Payer: BC Managed Care – PPO | Admitting: Lab

## 2012-01-24 ENCOUNTER — Ambulatory Visit (HOSPITAL_BASED_OUTPATIENT_CLINIC_OR_DEPARTMENT_OTHER): Payer: BC Managed Care – PPO | Admitting: Physician Assistant

## 2012-01-24 ENCOUNTER — Telehealth: Payer: Self-pay | Admitting: *Deleted

## 2012-01-24 ENCOUNTER — Encounter: Payer: Self-pay | Admitting: Physician Assistant

## 2012-01-24 VITALS — BP 118/80 | HR 87 | Temp 98.5°F | Ht 64.5 in | Wt 185.0 lb

## 2012-01-24 DIAGNOSIS — D693 Immune thrombocytopenic purpura: Secondary | ICD-10-CM

## 2012-01-24 LAB — CBC WITH DIFFERENTIAL/PLATELET
Eosinophils Absolute: 0.1 10*3/uL (ref 0.0–0.5)
MONO#: 0.7 10*3/uL (ref 0.1–0.9)
NEUT#: 4.4 10*3/uL (ref 1.5–6.5)
RBC: 4.86 10*6/uL (ref 3.70–5.45)
RDW: 15.2 % — ABNORMAL HIGH (ref 11.2–14.5)
WBC: 7.7 10*3/uL (ref 3.9–10.3)
lymph#: 2.4 10*3/uL (ref 0.9–3.3)
nRBC: 0 % (ref 0–0)

## 2012-01-24 NOTE — Telephone Encounter (Signed)
gave patient appointment for 02-2012 thru 06-2012 printed out calendar and gave to the patient

## 2012-01-24 NOTE — Progress Notes (Signed)
Hematology and Oncology Follow Up Visit  [name] [id number] 1981-10-03 31 y.o. 01/24/2012    HPI: [name] is a 31 year old [place] woman with a history of ITP, being followed on observation alone.  Interim History:   [name] is seen today for routine follow up again to her history of ITP. Since her last office visit, she has done quite well overall. She denies any unexplained bleeding or bruising symptoms. She does describe an episode of epistaxis that happen spontaneously while she was driving to work, but it was self-limiting. She continues to have heavy periods but these are very intermittent. She denies any unexplained, fevers, chills night sweats, shortness of breath or chest pain. She denies a diffuse bone pain. She does not take any medications routinely.  A detailed review of systems is otherwise noncontributory as noted below.  Review of Systems: Constitutional:  no weight loss, fever, night sweats and feels well Eyes: no complaints ENT: no complaints Cardiovascular: no chest pain or dyspnea on exertion Respiratory: no cough, shortness of breath, or wheezing Neurological: no TIA or stroke symptoms Dermatological: negative Gastrointestinal: no abdominal pain, change in bowel habits, or black or bloody stools Genito-Urinary: positive for - dysmenorrhea Hematological and Lymphatic: negative Breast: negative Musculoskeletal: negative Remaining ROS negative.   Medications:   I have reviewed the patient's current medications.  Current Outpatient Prescriptions  Medication Sig Dispense Refill  . cyclobenzaprine (FLEXERIL) 10 MG tablet Take 10 mg by mouth 3 (three) times daily as needed.      Marland Kitchen HYDROcodone-acetaminophen (VICODIN) 5-500 MG per tablet Take 1 tablet by mouth every 6 (six) hours as needed.        Allergies:  Allergies  Allergen Reactions  . Rituxan Swelling    Physical Exam: Filed Vitals:   01/24/12 1525  BP: 118/80  Pulse:  87  Temp: 98.5 F (36.9 C)  Weight: 185 lbs. HEENT:  Sclerae anicteric, conjunctivae pink.  Oropharynx clear.  No mucositis or candidiasis.   Nodes:  No cervical, supraclavicular, or axillary lymphadenopathy palpated.   Lungs:  Clear to auscultation bilaterally.  No crackles, rhonchi, or wheezes.   Heart:  Regular rate and rhythm.   Abdomen:  Soft, nontender.  Positive bowel sounds.  No organomegaly or masses palpated.   Musculoskeletal:  No focal spinal tenderness to palpation.  Extremities:  Benign.  No peripheral edema or cyanosis.   Skin:  Benign.   Neuro:  Nonfocal, alert and oriented x 3.   Lab Results: Lab Results  Component Value Date   WBC 7.7 01/24/2012   HGB 12.8 01/24/2012   HCT 39.0 01/24/2012   MCV 80.2 01/24/2012   PLT 77* 01/24/2012   NEUTROABS 4.4 01/24/2012     Chemistry      Component Value Date/Time   NA 136 11/30/2010 1516   K 3.5 11/30/2010 1516   CL 99 11/30/2010 1516   CO2 27 11/30/2010 1516   BUN 4* 11/30/2010 1516   CREATININE 0.76 11/30/2010 1516      Component Value Date/Time   CALCIUM 9.3 11/30/2010 1516   ALKPHOS 75 11/30/2010 1516   AST 20 11/30/2010 1516   ALT 13 11/30/2010 1516   BILITOT 0.6 11/30/2010 1516        Assessment:  [name] is a 31 year old [place] woman with a history of ITP, being followed on observation alone.  Case has been reviewed with Dr. Pierce Crane.   Plan:  [name] will return on a every 2  month basis for CBCs alone. We will see her at six-month intervals for routine followup which will include repeat CBC. She knows that she can feel free to contact us at any time in the interim if the need should arise.  This plan was reviewed with the patient, who voices understanding and agreement.  She knows to call with any changes or problems.    Louann Hopson T, PA-C 01/24/2012

## 2012-03-14 ENCOUNTER — Other Ambulatory Visit (HOSPITAL_BASED_OUTPATIENT_CLINIC_OR_DEPARTMENT_OTHER): Payer: BC Managed Care – PPO | Admitting: Lab

## 2012-03-14 ENCOUNTER — Other Ambulatory Visit: Payer: Self-pay | Admitting: *Deleted

## 2012-03-14 DIAGNOSIS — D693 Immune thrombocytopenic purpura: Secondary | ICD-10-CM

## 2012-03-14 LAB — CBC WITH DIFFERENTIAL/PLATELET
BASO%: 1 % (ref 0.0–2.0)
Basophils Absolute: 0.1 10*3/uL (ref 0.0–0.1)
HCT: 38.8 % (ref 34.8–46.6)
HGB: 12.5 g/dL (ref 11.6–15.9)
MCHC: 32.2 g/dL (ref 31.5–36.0)
MONO#: 0.9 10*3/uL (ref 0.1–0.9)
NEUT%: 61.5 % (ref 38.4–76.8)
WBC: 8.1 10*3/uL (ref 3.9–10.3)
lymph#: 2 10*3/uL (ref 0.9–3.3)

## 2012-03-20 ENCOUNTER — Other Ambulatory Visit (HOSPITAL_BASED_OUTPATIENT_CLINIC_OR_DEPARTMENT_OTHER): Payer: BC Managed Care – PPO

## 2012-03-20 DIAGNOSIS — D693 Immune thrombocytopenic purpura: Secondary | ICD-10-CM

## 2012-03-20 LAB — CBC WITH DIFFERENTIAL/PLATELET
Basophils Absolute: 0.1 10*3/uL (ref 0.0–0.1)
Eosinophils Absolute: 0.1 10*3/uL (ref 0.0–0.5)
HGB: 12.5 g/dL (ref 11.6–15.9)
MCV: 82.9 fL (ref 79.5–101.0)
MONO#: 0.9 10*3/uL (ref 0.1–0.9)
MONO%: 9.3 % (ref 0.0–14.0)
NEUT#: 6.2 10*3/uL (ref 1.5–6.5)
RDW: 15.3 % — ABNORMAL HIGH (ref 11.2–14.5)

## 2012-05-22 ENCOUNTER — Other Ambulatory Visit (HOSPITAL_BASED_OUTPATIENT_CLINIC_OR_DEPARTMENT_OTHER): Payer: BC Managed Care – PPO | Admitting: Lab

## 2012-05-22 DIAGNOSIS — D693 Immune thrombocytopenic purpura: Secondary | ICD-10-CM

## 2012-05-22 LAB — CBC WITH DIFFERENTIAL/PLATELET
Basophils Absolute: 0.1 10*3/uL (ref 0.0–0.1)
Eosinophils Absolute: 0.1 10*3/uL (ref 0.0–0.5)
HCT: 37 % (ref 34.8–46.6)
HGB: 12.2 g/dL (ref 11.6–15.9)
LYMPH%: 26 % (ref 14.0–49.7)
MCHC: 33 g/dL (ref 31.5–36.0)
MONO#: 0.9 10*3/uL (ref 0.1–0.9)
NEUT#: 5.6 10*3/uL (ref 1.5–6.5)
NEUT%: 62.7 % (ref 38.4–76.8)
Platelets: 110 10*3/uL — ABNORMAL LOW (ref 145–400)
WBC: 9 10*3/uL (ref 3.9–10.3)

## 2012-06-11 ENCOUNTER — Other Ambulatory Visit: Payer: Self-pay | Admitting: Obstetrics and Gynecology

## 2012-06-11 ENCOUNTER — Other Ambulatory Visit (HOSPITAL_COMMUNITY)
Admission: RE | Admit: 2012-06-11 | Discharge: 2012-06-11 | Disposition: A | Discharge status: Home / Self Care | Payer: BC Managed Care – PPO | Source: Ambulatory Visit | Attending: Obstetrics and Gynecology | Admitting: Obstetrics and Gynecology

## 2012-06-11 DIAGNOSIS — Z01419 Encounter for gynecological examination (general) (routine) without abnormal findings: Secondary | ICD-10-CM | POA: Insufficient documentation

## 2012-07-24 ENCOUNTER — Ambulatory Visit (HOSPITAL_BASED_OUTPATIENT_CLINIC_OR_DEPARTMENT_OTHER): Payer: BC Managed Care – PPO | Admitting: Oncology

## 2012-07-24 ENCOUNTER — Other Ambulatory Visit (HOSPITAL_BASED_OUTPATIENT_CLINIC_OR_DEPARTMENT_OTHER): Payer: BC Managed Care – PPO | Admitting: Lab

## 2012-07-24 VITALS — BP 117/75 | HR 85 | Temp 98.6°F | Resp 20 | Ht 64.5 in | Wt 177.6 lb

## 2012-07-24 DIAGNOSIS — D693 Immune thrombocytopenic purpura: Secondary | ICD-10-CM

## 2012-07-24 LAB — CBC WITH DIFFERENTIAL/PLATELET
Basophils Absolute: 0.1 10*3/uL (ref 0.0–0.1)
EOS%: 0.7 % (ref 0.0–7.0)
Eosinophils Absolute: 0.1 10*3/uL (ref 0.0–0.5)
HCT: 38.5 % (ref 34.8–46.6)
HGB: 12.2 g/dL (ref 11.6–15.9)
MCH: 26 pg (ref 25.1–34.0)
MCV: 82.1 fL (ref 79.5–101.0)
MONO%: 8.6 % (ref 0.0–14.0)
NEUT%: 63.4 % (ref 38.4–76.8)

## 2012-07-24 NOTE — Progress Notes (Signed)
Hematology and Oncology Follow Up Visit  [name] [id number] 03-03-1981 31 y.o. 07/24/2012    HPI: [name] is a 31 year old [place] woman with a history of ITP, being followed on observation alone.  Interim History: . Patient returns for followup. She is doing well. She has had no exacerbations of bleeding or bruising. Her platelet counts over the past number of months been fairly stable. I did show her a radical detection of her platelet counts are since her diagnosis. Overall think her platelet counts have been up over 50,000 pretty consistently. She has had 2 episodes where she hasn't required therapy for a 4 ITP. She is in the process of bone in the home and is also been looking into trying a pregnant. There may be some infertility issues going on with respect to her husband. A detailed review of systems is otherwise noncontributory as noted below.  Review of Systems: Constitutional:  no weight loss, fever, night sweats and feels well Eyes: no complaints ENT: no complaints Cardiovascular: no chest pain or dyspnea on exertion Respiratory: no cough, shortness of breath, or wheezing Neurological: no TIA or stroke symptoms Dermatological: negative Gastrointestinal: no abdominal pain, change in bowel habits, or black or bloody stools Genito-Urinary: positive for - dysmenorrhea Hematological and Lymphatic: negative Breast: negative Musculoskeletal: negative Remaining ROS negative.   Medications:   I have reviewed the patient's current medications.  Current Outpatient Prescriptions  Medication Sig Dispense Refill  . cyclobenzaprine (FLEXERIL) 10 MG tablet Take 10 mg by mouth 3 (three) times daily as needed.        Allergies:  Allergies  Allergen Reactions  . Rituximab Swelling    Physical Exam: Filed Vitals:   07/24/12 1629  BP: 117/75  Pulse: 85  Temp: 98.6 F (37 C)  Resp: 20  Weight: 185 lbs. HEENT:  Sclerae anicteric, conjunctivae  pink.  Oropharynx clear.  No mucositis or candidiasis.   Nodes:  No cervical, supraclavicular, or axillary lymphadenopathy palpated.   Lungs:  Clear to auscultation bilaterally.  No crackles, rhonchi, or wheezes.   Heart:  Regular rate and rhythm.   Abdomen:  Soft, nontender.  Positive bowel sounds.  No organomegaly or masses palpated.   Musculoskeletal:  No focal spinal tenderness to palpation.  Extremities:  Benign.  No peripheral edema or cyanosis.   Skin:  Benign.   Neuro:  Nonfocal, alert and oriented x 3.   Lab Results: Lab Results  Component Value Date   WBC 9.4 07/24/2012   HGB 12.2 07/24/2012   HCT 38.5 07/24/2012   MCV 82.1 07/24/2012   PLT 71* 07/24/2012   NEUTROABS 5.9 07/24/2012     Chemistry      Component Value Date/Time   NA 136 11/30/2010 1516   K 3.5 11/30/2010 1516   CL 99 11/30/2010 1516   CO2 27 11/30/2010 1516   BUN 4* 11/30/2010 1516   CREATININE 0.76 11/30/2010 1516      Component Value Date/Time   CALCIUM 9.3 11/30/2010 1516   ALKPHOS 75 11/30/2010 1516   AST 20 11/30/2010 1516   ALT 13 11/30/2010 1516   BILITOT 0.6 11/30/2010 1516        Assessment:  [name] is a 31 year old [place] woman with a history of ITP, being followed on observation alone.  .   Plan:  We discussed the followup plan will continue to see her monthly continue to check blood counts drawn monthly and see her in 6 months. I  have given her my contact information she knows she can call that you will need should arise. I wished her well.  This plan was reviewed with the patient, who voices understanding and agreement.  She knows to call with any changes or problems.    Richardson Chiquito 07/24/2012

## 2012-08-15 ENCOUNTER — Telehealth: Payer: Self-pay | Admitting: *Deleted

## 2012-08-15 NOTE — Telephone Encounter (Signed)
08-21-2012  09-25-2012  10-23-2012  11-21-2012  12-04-2012  Patient confirmed over the phone

## 2012-08-20 ENCOUNTER — Telehealth: Payer: Self-pay | Admitting: Oncology

## 2012-08-20 ENCOUNTER — Other Ambulatory Visit (HOSPITAL_BASED_OUTPATIENT_CLINIC_OR_DEPARTMENT_OTHER): Payer: BC Managed Care – PPO | Admitting: Lab

## 2012-08-20 DIAGNOSIS — D693 Immune thrombocytopenic purpura: Secondary | ICD-10-CM

## 2012-08-20 LAB — MORPHOLOGY: PLT EST: DECREASED

## 2012-08-20 LAB — CBC WITH DIFFERENTIAL/PLATELET
Basophils Absolute: 0.1 10*3/uL (ref 0.0–0.1)
EOS%: 1.8 % (ref 0.0–7.0)
HGB: 12.6 g/dL (ref 11.6–15.9)
LYMPH%: 30 % (ref 14.0–49.7)
MCH: 26.1 pg (ref 25.1–34.0)
MCV: 79.5 fL (ref 79.5–101.0)
MONO%: 8 % (ref 0.0–14.0)
Platelets: 79 10*3/uL — ABNORMAL LOW (ref 145–400)
RBC: 4.83 10*6/uL (ref 3.70–5.45)
RDW: 16 % — ABNORMAL HIGH (ref 11.2–14.5)

## 2012-08-20 NOTE — Telephone Encounter (Signed)
pt l/m to do her lab today instead of 9/24,l/m for her to come in at 3;45 per her req

## 2012-08-21 ENCOUNTER — Other Ambulatory Visit: Payer: BC Managed Care – PPO | Admitting: Lab

## 2012-08-25 ENCOUNTER — Encounter: Payer: Self-pay | Admitting: Oncology

## 2012-09-12 ENCOUNTER — Telehealth: Payer: Self-pay | Admitting: *Deleted

## 2012-09-12 NOTE — Telephone Encounter (Signed)
Rec'd call in triage from patient stating she started her menses yesterday and has noted that she is bleeding more in volume, having to change her tampons/pads more frequently. Wants to know if she should come in the morning to have her labs checked and make sure her platelets are not dropping.  Informed patient I would notify/inquire with Dr Donnie Coffin and would call her with appt. Patient states she will need appt early if ordered for tomorrow. Informed her we would call her back.

## 2012-09-12 NOTE — Telephone Encounter (Signed)
per nurse amy requested to add on patient for lab only on 09-13-2012 at 8:00am cancelled lab only on 09-25-2012  Patient confirmed over the phone the new date and time on 09-13-2012 lab only at 8:00am patient knows she is surpose to waiting for the results

## 2012-09-13 ENCOUNTER — Ambulatory Visit (HOSPITAL_BASED_OUTPATIENT_CLINIC_OR_DEPARTMENT_OTHER): Payer: BC Managed Care – PPO | Admitting: Lab

## 2012-09-13 ENCOUNTER — Telehealth: Payer: Self-pay | Admitting: Oncology

## 2012-09-13 DIAGNOSIS — D693 Immune thrombocytopenic purpura: Secondary | ICD-10-CM

## 2012-09-13 LAB — CBC WITH DIFFERENTIAL/PLATELET
Basophils Absolute: 0.1 10*3/uL (ref 0.0–0.1)
EOS%: 1.1 % (ref 0.0–7.0)
Eosinophils Absolute: 0.1 10*3/uL (ref 0.0–0.5)
HCT: 38.7 % (ref 34.8–46.6)
HGB: 12.4 g/dL (ref 11.6–15.9)
MCH: 26.2 pg (ref 25.1–34.0)
MONO#: 0.8 10*3/uL (ref 0.1–0.9)
NEUT#: 3.6 10*3/uL (ref 1.5–6.5)
NEUT%: 56.5 % (ref 38.4–76.8)
RDW: 16 % — ABNORMAL HIGH (ref 11.2–14.5)
lymph#: 1.8 10*3/uL (ref 0.9–3.3)

## 2012-09-13 NOTE — Telephone Encounter (Signed)
gve the pt her oct 2013 appt calendar °

## 2012-09-16 ENCOUNTER — Other Ambulatory Visit: Payer: BC Managed Care – PPO | Admitting: Lab

## 2012-09-16 DIAGNOSIS — D693 Immune thrombocytopenic purpura: Secondary | ICD-10-CM

## 2012-09-16 LAB — CBC WITH DIFFERENTIAL/PLATELET
Basophils Absolute: 0 10*3/uL (ref 0.0–0.1)
EOS%: 2.1 % (ref 0.0–7.0)
Eosinophils Absolute: 0.2 10*3/uL (ref 0.0–0.5)
HCT: 38.3 % (ref 34.8–46.6)
HGB: 12.4 g/dL (ref 11.6–15.9)
LYMPH%: 38.1 % (ref 14.0–49.7)
MCH: 26.4 pg (ref 25.1–34.0)
MCV: 81.6 fL (ref 79.5–101.0)
MONO%: 13 % (ref 0.0–14.0)
NEUT#: 3.4 10*3/uL (ref 1.5–6.5)
NEUT%: 46.6 % (ref 38.4–76.8)
Platelets: 89 10*3/uL — ABNORMAL LOW (ref 145–400)
RDW: 16 % — ABNORMAL HIGH (ref 11.2–14.5)

## 2012-09-25 ENCOUNTER — Other Ambulatory Visit: Payer: BC Managed Care – PPO | Admitting: Lab

## 2012-10-23 ENCOUNTER — Other Ambulatory Visit: Payer: BC Managed Care – PPO | Admitting: Lab

## 2012-10-30 ENCOUNTER — Other Ambulatory Visit (HOSPITAL_BASED_OUTPATIENT_CLINIC_OR_DEPARTMENT_OTHER): Payer: BC Managed Care – PPO | Admitting: Lab

## 2012-10-30 DIAGNOSIS — D693 Immune thrombocytopenic purpura: Secondary | ICD-10-CM

## 2012-10-30 LAB — CBC WITH DIFFERENTIAL/PLATELET
Basophils Absolute: 0.1 10*3/uL (ref 0.0–0.1)
EOS%: 0.8 % (ref 0.0–7.0)
Eosinophils Absolute: 0.1 10*3/uL (ref 0.0–0.5)
LYMPH%: 33 % (ref 14.0–49.7)
MCH: 25.8 pg (ref 25.1–34.0)
MCV: 79.2 fL — ABNORMAL LOW (ref 79.5–101.0)
MONO%: 12.1 % (ref 0.0–14.0)
NEUT#: 4 10*3/uL (ref 1.5–6.5)
Platelets: 108 10*3/uL — ABNORMAL LOW (ref 145–400)
RBC: 4.77 10*6/uL (ref 3.70–5.45)
RDW: 15.8 % — ABNORMAL HIGH (ref 11.2–14.5)
nRBC: 0 % (ref 0–0)

## 2012-11-16 ENCOUNTER — Telehealth: Payer: Self-pay | Admitting: *Deleted

## 2012-11-16 NOTE — Telephone Encounter (Signed)
Left voice message informing the patient of the need to reschedule her appointment for 12-04-2012

## 2012-11-19 ENCOUNTER — Telehealth: Payer: Self-pay | Admitting: *Deleted

## 2012-11-19 NOTE — Telephone Encounter (Signed)
former patient of dr.rubin re-establishing with dr.ha

## 2012-11-21 ENCOUNTER — Other Ambulatory Visit (HOSPITAL_BASED_OUTPATIENT_CLINIC_OR_DEPARTMENT_OTHER): Payer: BC Managed Care – PPO | Admitting: Lab

## 2012-11-21 DIAGNOSIS — D693 Immune thrombocytopenic purpura: Secondary | ICD-10-CM

## 2012-11-21 LAB — CBC WITH DIFFERENTIAL/PLATELET
BASO%: 0.7 % (ref 0.0–2.0)
EOS%: 1.3 % (ref 0.0–7.0)
HCT: 38.4 % (ref 34.8–46.6)
LYMPH%: 31.6 % (ref 14.0–49.7)
MCH: 26.6 pg (ref 25.1–34.0)
MCHC: 32.5 g/dL (ref 31.5–36.0)
NEUT%: 56.2 % (ref 38.4–76.8)
Platelets: 77 10*3/uL — ABNORMAL LOW (ref 145–400)
RBC: 4.7 10*6/uL (ref 3.70–5.45)

## 2012-11-30 ENCOUNTER — Encounter: Payer: Self-pay | Admitting: Oncology

## 2012-12-03 NOTE — Patient Instructions (Signed)
1.  Diagnosis:  ITP (immune thrombocytopenic purpura). 2.  Treatment:  Observation.  There is low (not 0) risk of spontaneous bleeding with platelet >30.   3.  Recommendation:  Continue observation.  In the future, if platelet count continues to decrease <30, we may consider further work up and treatment.

## 2012-12-04 ENCOUNTER — Other Ambulatory Visit: Payer: BC Managed Care – PPO | Admitting: Lab

## 2012-12-04 ENCOUNTER — Ambulatory Visit: Payer: BC Managed Care – PPO | Admitting: Oncology

## 2012-12-04 NOTE — Progress Notes (Signed)
No show for first visit.  No show letter sent.

## 2012-12-12 ENCOUNTER — Telehealth: Payer: Self-pay | Admitting: *Deleted

## 2012-12-12 ENCOUNTER — Other Ambulatory Visit: Payer: Self-pay | Admitting: Oncology

## 2012-12-12 ENCOUNTER — Other Ambulatory Visit: Payer: Self-pay | Admitting: Emergency Medicine

## 2012-12-12 ENCOUNTER — Other Ambulatory Visit (HOSPITAL_BASED_OUTPATIENT_CLINIC_OR_DEPARTMENT_OTHER): Payer: BC Managed Care – PPO | Admitting: Lab

## 2012-12-12 DIAGNOSIS — D693 Immune thrombocytopenic purpura: Secondary | ICD-10-CM

## 2012-12-12 LAB — COMPREHENSIVE METABOLIC PANEL (CC13)
ALT: <6 U/L (ref 0–55)
AST: 11 U/L (ref 5–34)
Alkaline Phosphatase: 73 U/L (ref 40–150)
Glucose: 91 mg/dl (ref 70–99)
Sodium: 139 mEq/L (ref 136–145)
Total Bilirubin: 0.54 mg/dL (ref 0.20–1.20)
Total Protein: 7.2 g/dL (ref 6.4–8.3)

## 2012-12-12 LAB — CBC WITH DIFFERENTIAL/PLATELET
BASO%: 0.9 % (ref 0.0–2.0)
EOS%: 2.6 % (ref 0.0–7.0)
MCH: 25.9 pg (ref 25.1–34.0)
MCHC: 32.3 g/dL (ref 31.5–36.0)
RBC: 4.86 10*6/uL (ref 3.70–5.45)
RDW: 16.1 % — ABNORMAL HIGH (ref 11.2–14.5)
WBC: 6.6 10*3/uL (ref 3.9–10.3)
lymph#: 2.5 10*3/uL (ref 0.9–3.3)
nRBC: 0 % (ref 0–0)

## 2012-12-12 LAB — CHCC SMEAR

## 2012-12-12 NOTE — Telephone Encounter (Signed)
Called pt back and relayed Dr. Lodema Pilot response below.  She verbalized understanding.

## 2012-12-12 NOTE — Telephone Encounter (Signed)
Called pt w/ lab results and Dr. Lodema Pilot message about her next appt..  Informed her he ordered next lab/visit in 3 to 4 months.  Pt asking if she is supposed to have monthly labs before her next office visit?  States she is used to having monthly labs ordered.

## 2012-12-12 NOTE — Telephone Encounter (Signed)
Message copied by Wende Mott on Thu Dec 12, 2012  4:53 PM ------      Message from: HA, Raliegh Ip T      Created: Thu Dec 12, 2012  4:36 PM       Please call pt.  Her ITP is stable on observation.  I'll turn in a POF to reschedule her missed appointment with me last week.

## 2012-12-12 NOTE — Telephone Encounter (Signed)
Her plt has been quite stable.  There is no reason to have monthly lab test unless she has symptoms.  Will check her CBC again when she sees me within the next few months.  POF has been turned in.  Thanks.

## 2012-12-12 NOTE — Telephone Encounter (Signed)
Patient calling in stating she feels like she is "getting sick". Having cold symptoms, fatigue, no fever. States when she starts getting sick, usually her platelets are getting low. Patient does have primary MD with [facility], but they send out labs. Spoke with pod partner of patient previous MD, Dr Darnelle Catalan will review labs today if patient can come at 330 to have them checked. Called patient, she is to come today, see the desk nurse and reschedule appt with Dr Gaylyn Rong. Patient verbalized understanding.

## 2012-12-13 ENCOUNTER — Telehealth: Payer: Self-pay | Admitting: Oncology

## 2012-12-13 NOTE — Telephone Encounter (Signed)
lvm for pt regarding April appt...mailed April appt schedule to pt. °

## 2012-12-19 ENCOUNTER — Ambulatory Visit: Payer: BC Managed Care – PPO | Admitting: Oncology

## 2013-03-13 ENCOUNTER — Ambulatory Visit (HOSPITAL_BASED_OUTPATIENT_CLINIC_OR_DEPARTMENT_OTHER): Payer: BC Managed Care – PPO | Admitting: Oncology

## 2013-03-13 ENCOUNTER — Encounter: Payer: Self-pay | Admitting: Oncology

## 2013-03-13 ENCOUNTER — Telehealth: Payer: Self-pay | Admitting: Oncology

## 2013-03-13 ENCOUNTER — Other Ambulatory Visit (HOSPITAL_BASED_OUTPATIENT_CLINIC_OR_DEPARTMENT_OTHER): Payer: BC Managed Care – PPO | Admitting: Lab

## 2013-03-13 VITALS — BP 127/76 | HR 108 | Temp 98.7°F | Resp 20 | Ht 64.5 in | Wt 178.4 lb

## 2013-03-13 DIAGNOSIS — D693 Immune thrombocytopenic purpura: Secondary | ICD-10-CM

## 2013-03-13 LAB — CBC WITH DIFFERENTIAL/PLATELET
Basophils Absolute: 0.1 10*3/uL (ref 0.0–0.1)
Eosinophils Absolute: 0 10*3/uL (ref 0.0–0.5)
LYMPH%: 23.3 % (ref 14.0–49.7)
MCV: 80.1 fL (ref 79.5–101.0)
MONO%: 6.1 % (ref 0.0–14.0)
NEUT#: 7.6 10*3/uL — ABNORMAL HIGH (ref 1.5–6.5)
Platelets: 54 10*3/uL — ABNORMAL LOW (ref 145–400)
RBC: 4.75 10*6/uL (ref 3.70–5.45)

## 2013-03-13 NOTE — Progress Notes (Signed)
2005; viral flu; plt <5.    In hospital.  Reaction with plt transfusion?    Prednisone for 2 years. Rituxan:  Worked temporarly.  Splenectomy 2006.  IVIg:  12/2009.  Did work.   Heavy menstrual period. [place].    [facility]  Telephone:([phone] Fax:(336) [phone]   OFFICE PROGRESS NOTE   Cc:  Astrid Divine, MD  DIAGNOSIS:  Recurrent ITP since 2005   PAST THERAPY:   Prednisone, Rituxan, IVIg (last in 12/2009), splenectomy 2006  CURRENT THERAPY:  Watchful observation.   INTERVAL HISTORY: [name] 32 y.o. female returns for regular follow up by herself.  She reports feeling well.  She has minor fatigue however she is able to still work full time. She denies petechiae, ecchymosis, fever, gum bleeding, epistaxis, hemoptysis, hematemesis, melena, hematochezia. She denies anorexia, weight loss, chest pain, abdominal pain, bone pain, neuropathy. The rest of the 14 point review of system was negative.  She and husband have been having problem with getting pregnant because of her husband's infertility.  They are debating whether to use a donor sperm or adopt a kid.       Past Medical History  Diagnosis Date  . ITP (idiopathic thrombocytopenic purpura)     No past surgical history on file.  Current Outpatient Prescriptions  Medication Sig Dispense Refill  . cyclobenzaprine (FLEXERIL) 10 MG tablet Take 10 mg by mouth 3 (three) times daily as needed.      [name] Triphasic (ORTHO TRI-CYCLEN LO PO) Take 1 tablet by mouth daily.       No current facility-administered medications for this visit.    ALLERGIES:  is allergic to rituximab.  REVIEW OF SYSTEMS:  The rest of the 14-point review of system was negative.   Filed Vitals:   03/13/13 1501  BP: 127/76  Pulse: 108  Temp: 98.7 F (37.1 C)  Resp: 20   Wt Readings from Last 3 Encounters:  03/13/13 178 lb 6.4 oz (80.922 kg)  07/24/12 177 lb 9.6 oz (80.559 kg)  01/24/12  185 lb (83.915 kg)   ECOG Performance status: 0  PHYSICAL EXAMINATION:   General:  well-nourished woman, in no acute distress.  Eyes:  no scleral icterus.  ENT:  There were no oropharyngeal lesions.  Neck was without thyromegaly.  Lymphatics:  Negative cervical, supraclavicular or axillary adenopathy.  Respiratory: lungs were clear bilaterally without wheezing or crackles.  Cardiovascular:  Regular rate and rhythm, S1/S2, without murmur, rub or gallop.  There was no pedal edema.  GI:  abdomen was soft, flat, nontender, nondistended, without organomegaly.  Muscoloskeletal:  no spinal tenderness of palpation of vertebral spine.  Skin exam was without echymosis, petichae.  Neuro exam was nonfocal.  Patient was able to get on and off exam table without assistance.  Gait was normal.  Patient was alert and oriented.  Attention was good.   Language was appropriate.  Mood was normal without depression.  Speech was not pressured.  Thought content was not tangential.         LABORATORY/RADIOLOGY DATA:  Lab Results  Component Value Date   WBC 10.9* 03/13/2013   HGB 12.3 03/13/2013   HCT 38.1 03/13/2013   PLT 54* 03/13/2013   GLUCOSE 91 12/12/2012   ALKPHOS 73 12/12/2012   ALT <6 Repeated and Verified 12/12/2012   AST 11 12/12/2012   NA 139 12/12/2012   K 4.2 12/12/2012   CL 105 12/12/2012   CREATININE 1.0 12/12/2012  BUN 7.0 12/12/2012   CO2 26 12/12/2012   INR 1.08 01/21/2010     ASSESSMENT AND PLAN:   1. Issue:  Recurrent ITP. 2. Options if plt <30:  *  IVIg.  *  Cellcept (immunosuppressive therapy normally used in transplant).  Cannot be used if pregnant.  *  Hormone:  Oral Promecta or IV Nplate.  Prolonged therapy.  3.  Follow up:  Lab every 2 weeks x 2 months.  Then monthly.  Return visit in about 6 months.      The length of time of the face-to-face encounter was 25 minutes. More than 50% of time was spent counseling and coordination of care.

## 2013-03-13 NOTE — Patient Instructions (Addendum)
1. Issue:  Recurrent ITP. 2. Options if plt <30:  *  IVIg.  *  Cellcept (immunosuppressive therapy normally used in transplant).  Cannot be used if pregnant.  *  Hormone:  Oral Promecta or IV Nplate.  Prolonged therapy.  3.  Follow up:  Lab every 2 weeks x 2 months.  Then monthly.  Return visit in about 6 months.

## 2013-03-13 NOTE — Telephone Encounter (Signed)
gve the pt her may-oct 2014 appt calendar.

## 2013-03-26 ENCOUNTER — Ambulatory Visit (HOSPITAL_BASED_OUTPATIENT_CLINIC_OR_DEPARTMENT_OTHER): Payer: BC Managed Care – PPO | Admitting: Lab

## 2013-03-26 DIAGNOSIS — D693 Immune thrombocytopenic purpura: Secondary | ICD-10-CM

## 2013-03-26 LAB — CBC WITH DIFFERENTIAL/PLATELET
Basophils Absolute: 0.1 10*3/uL (ref 0.0–0.1)
Eosinophils Absolute: 0.1 10*3/uL (ref 0.0–0.5)
HGB: 12.9 g/dL (ref 11.6–15.9)
NEUT#: 5.4 10*3/uL (ref 1.5–6.5)
RDW: 15.4 % — ABNORMAL HIGH (ref 11.2–14.5)
WBC: 8.9 10*3/uL (ref 3.9–10.3)
lymph#: 2 10*3/uL (ref 0.9–3.3)
nRBC: 0 % (ref 0–0)

## 2013-04-02 ENCOUNTER — Other Ambulatory Visit: Payer: BC Managed Care – PPO | Admitting: Lab

## 2013-04-16 ENCOUNTER — Other Ambulatory Visit: Payer: BC Managed Care – PPO | Admitting: Lab

## 2013-04-30 ENCOUNTER — Telehealth: Payer: Self-pay | Admitting: Oncology

## 2013-04-30 ENCOUNTER — Other Ambulatory Visit: Payer: BC Managed Care – PPO | Admitting: Lab

## 2013-05-01 ENCOUNTER — Telehealth: Payer: Self-pay | Admitting: Oncology

## 2013-05-02 ENCOUNTER — Other Ambulatory Visit: Payer: BC Managed Care – PPO | Admitting: Lab

## 2013-05-07 ENCOUNTER — Ambulatory Visit (HOSPITAL_BASED_OUTPATIENT_CLINIC_OR_DEPARTMENT_OTHER): Payer: BC Managed Care – PPO | Admitting: Oncology

## 2013-05-07 ENCOUNTER — Other Ambulatory Visit: Payer: BC Managed Care – PPO | Admitting: Lab

## 2013-05-07 ENCOUNTER — Telehealth: Payer: Self-pay | Admitting: *Deleted

## 2013-05-07 ENCOUNTER — Other Ambulatory Visit: Payer: Self-pay | Admitting: Oncology

## 2013-05-07 ENCOUNTER — Other Ambulatory Visit (HOSPITAL_BASED_OUTPATIENT_CLINIC_OR_DEPARTMENT_OTHER): Payer: BC Managed Care – PPO | Admitting: Lab

## 2013-05-07 ENCOUNTER — Encounter: Payer: Self-pay | Admitting: Oncology

## 2013-05-07 ENCOUNTER — Telehealth: Payer: Self-pay | Admitting: Oncology

## 2013-05-07 VITALS — BP 128/73 | HR 94 | Temp 97.5°F | Resp 18 | Ht 64.0 in | Wt 175.9 lb

## 2013-05-07 DIAGNOSIS — D693 Immune thrombocytopenic purpura: Secondary | ICD-10-CM

## 2013-05-07 DIAGNOSIS — R5383 Other fatigue: Secondary | ICD-10-CM

## 2013-05-07 DIAGNOSIS — R5381 Other malaise: Secondary | ICD-10-CM

## 2013-05-07 LAB — CBC WITH DIFFERENTIAL/PLATELET
Eosinophils Absolute: 0.1 10*3/uL (ref 0.0–0.5)
HCT: 38.1 % (ref 34.8–46.6)
LYMPH%: 26.5 % (ref 14.0–49.7)
MONO#: 0.9 10*3/uL (ref 0.1–0.9)
NEUT#: 5.1 10*3/uL (ref 1.5–6.5)
NEUT%: 60.9 % (ref 38.4–76.8)
Platelets: 23 10*3/uL — ABNORMAL LOW (ref 145–400)
RBC: 4.82 10*6/uL (ref 3.70–5.45)
WBC: 8.4 10*3/uL (ref 3.9–10.3)
lymph#: 2.2 10*3/uL (ref 0.9–3.3)
nRBC: 0 % (ref 0–0)

## 2013-05-07 NOTE — Telephone Encounter (Signed)
Per staff message and POF I have scheduled appts.  JMW  

## 2013-05-07 NOTE — Telephone Encounter (Signed)
Gave pt appt for labs and ML until October 2014 , emailed Marcelino Duster regarding IVIG on 6/13 and 6/16

## 2013-05-07 NOTE — Progress Notes (Signed)
[facility]  Telephone:([phone] Fax:(336) [phone]   OFFICE PROGRESS NOTE   Cc:  Astrid Divine, MD  DIAGNOSIS:  Recurrent ITP since 2005   PAST THERAPY:   Prednisone, Rituxan, IVIg (last in 12/2009), splenectomy 2006  CURRENT THERAPY:  Watchful observation.   INTERVAL HISTORY: [name] 32 y.o. female returns for a work-in visit by herself.  She was here for routine labs today and Plt count was 23,000. She reports feeling well; she was surprised to hear that her plt count was low again.  She has minor fatigue however she is able to still work full time. She denies petechiae, ecchymosis, fever, gum bleeding, epistaxis, hemoptysis, hematemesis, melena, hematochezia. Patient had a recent sinus infection and was placed on a Z-pack 2 weeks ago. She denies anorexia, weight loss, chest pain, abdominal pain, bone pain, neuropathy. The rest of the 14 point review of system was negative.  She and husband have been having problem with getting pregnant because of her husband's infertility.  They are debating whether to use a donor sperm or adopt a kid.       Past Medical History  Diagnosis Date  . ITP (idiopathic thrombocytopenic purpura)     Past Surgical History  Procedure Laterality Date  . Splenetomy  2006    Current Outpatient Prescriptions  Medication Sig Dispense Refill  . cyclobenzaprine (FLEXERIL) 10 MG tablet Take 10 mg by mouth 3 (three) times daily as needed.      [name] Triphasic (ORTHO TRI-CYCLEN LO PO) Take 1 tablet by mouth daily.       No current facility-administered medications for this visit.    ALLERGIES:  is allergic to rituximab.  REVIEW OF SYSTEMS:  The rest of the 14-point review of system was negative.   Filed Vitals:   05/07/13 1339  BP: 128/73  Pulse: 94  Temp: 97.5 F (36.4 C)  Resp: 18   Wt Readings from Last 3 Encounters:  05/07/13 175 lb 14.4 oz (79.788 kg)  03/13/13 178 lb 6.4 oz (80.922  kg)  07/24/12 177 lb 9.6 oz (80.559 kg)   ECOG Performance status: 0  PHYSICAL EXAMINATION:   General:  well-nourished woman, in no acute distress.  Eyes:  no scleral icterus.  ENT:  There were no oropharyngeal lesions.  Neck was without thyromegaly.  Lymphatics:  Negative cervical, supraclavicular or axillary adenopathy.  Respiratory: lungs were clear bilaterally without wheezing or crackles.  Cardiovascular:  Regular rate and rhythm, S1/S2, without murmur, rub or gallop.  There was no pedal edema.  GI:  abdomen was soft, flat, nontender, nondistended, without organomegaly.  Muscoloskeletal:  no spinal tenderness of palpation of vertebral spine.  Skin exam was without echymosis, petichae.  Neuro exam was nonfocal.  Patient was able to get on and off exam table without assistance.  Gait was normal.  Patient was alert and oriented.  Attention was good.   Language was appropriate.  Mood was normal without depression.  Speech was not pressured.  Thought content was not tangential.     LABORATORY/RADIOLOGY DATA:  Lab Results  Component Value Date   WBC 8.4 05/07/2013   HGB 12.4 05/07/2013   HCT 38.1 05/07/2013   PLT 23 Large & giant platelets* 05/07/2013   GLUCOSE 91 12/12/2012   ALKPHOS 73 12/12/2012   ALT <6 Repeated and Verified 12/12/2012   AST 11 12/12/2012   NA 139 12/12/2012   K 4.2 12/12/2012   CL 105 12/12/2012  CREATININE 1.0 12/12/2012   BUN 7.0 12/12/2012   CO2 26 12/12/2012   INR 1.08 01/21/2010     ASSESSMENT AND PLAN:   1. Issue:  Recurrent ITP. 2. Plt count is now 23,000 without bleeding.  Options reviewed with the patient:  *  Prednisone  *  IVIg.  *  Cellcept (immunosuppressive therapy normally used in transplant).  Cannot be used if pregnant.  *  Hormone:  Oral Promecta or IV Nplate.  Prolonged therapy.  We have recommended Prednisone 40 mg daily for at least 3 days with recheck of counts at the end of the week. If not improving, then plan for IVIG. Patient is admant that  she not receive Prednisone due to previous side effects of fluid retention. She has requested that we recheck labs on 6/13 and if platelets drop further, then she would like IVIG. We have arranged this for her. 3.  Follow up:  Lab 6/13 and possible IVIG. Keep all other appointments as scheduled.      The length of time of the face-to-face encounter was 30 minutes. More than 50% of time was spent counseling and coordination of care.

## 2013-05-07 NOTE — Telephone Encounter (Signed)
Patient in for lab only, significant drop in platelets noted. Per Dr Gaylyn Rong, have patient added to schedule today for plan. Orders to scheduling, escorts notified.

## 2013-05-09 ENCOUNTER — Telehealth: Payer: Self-pay | Admitting: Oncology

## 2013-05-09 ENCOUNTER — Ambulatory Visit: Payer: BC Managed Care – PPO

## 2013-05-09 ENCOUNTER — Other Ambulatory Visit (HOSPITAL_BASED_OUTPATIENT_CLINIC_OR_DEPARTMENT_OTHER): Payer: BC Managed Care – PPO

## 2013-05-09 ENCOUNTER — Other Ambulatory Visit: Payer: Self-pay | Admitting: Oncology

## 2013-05-09 ENCOUNTER — Telehealth: Payer: Self-pay | Admitting: *Deleted

## 2013-05-09 DIAGNOSIS — D693 Immune thrombocytopenic purpura: Secondary | ICD-10-CM

## 2013-05-09 LAB — CBC WITH DIFFERENTIAL/PLATELET
Basophils Absolute: 0.1 10*3/uL (ref 0.0–0.1)
EOS%: 1.5 % (ref 0.0–7.0)
Eosinophils Absolute: 0.1 10*3/uL (ref 0.0–0.5)
HCT: 37.3 % (ref 34.8–46.6)
HGB: 12.1 g/dL (ref 11.6–15.9)
MCH: 25.6 pg (ref 25.1–34.0)
NEUT#: 6.4 10*3/uL (ref 1.5–6.5)
NEUT%: 68.9 % (ref 38.4–76.8)
RDW: 15.2 % — ABNORMAL HIGH (ref 11.2–14.5)
lymph#: 1.8 10*3/uL (ref 0.9–3.3)

## 2013-05-09 NOTE — Telephone Encounter (Signed)
Left pt VM informing of no need for IVIG this week or next week per Dr. Gaylyn Rong,  Since platelet count up to 38.  Instructed her to expect call from Scheduling to schedule weekly labs.  Asked her to call back if any questions.

## 2013-05-12 ENCOUNTER — Ambulatory Visit: Payer: BC Managed Care – PPO

## 2013-05-14 ENCOUNTER — Other Ambulatory Visit (HOSPITAL_BASED_OUTPATIENT_CLINIC_OR_DEPARTMENT_OTHER): Payer: BC Managed Care – PPO | Admitting: Lab

## 2013-05-14 DIAGNOSIS — D693 Immune thrombocytopenic purpura: Secondary | ICD-10-CM

## 2013-05-14 LAB — CBC WITH DIFFERENTIAL/PLATELET
Basophils Absolute: 0 10*3/uL (ref 0.0–0.1)
Eosinophils Absolute: 0.1 10*3/uL (ref 0.0–0.5)
HCT: 37.7 % (ref 34.8–46.6)
HGB: 12.3 g/dL (ref 11.6–15.9)
LYMPH%: 23.9 % (ref 14.0–49.7)
MCV: 78.7 fL — ABNORMAL LOW (ref 79.5–101.0)
MONO#: 1 10*3/uL — ABNORMAL HIGH (ref 0.1–0.9)
MONO%: 10.3 % (ref 0.0–14.0)
NEUT#: 6.5 10*3/uL (ref 1.5–6.5)
NEUT%: 64.3 % (ref 38.4–76.8)
Platelets: 40 10*3/uL — ABNORMAL LOW (ref 145–400)
RBC: 4.79 10*6/uL (ref 3.70–5.45)
WBC: 10.1 10*3/uL (ref 3.9–10.3)

## 2013-05-16 ENCOUNTER — Telehealth: Payer: Self-pay

## 2013-05-16 NOTE — Telephone Encounter (Signed)
Message copied by Kallie Locks on Fri May 16, 2013  9:17 AM ------      Message from: Clenton Pare R      Created: Thu May 15, 2013 11:08 AM       Please call pt with Plt count. Up to 40,000. Recommend continued observation. ------

## 2013-05-21 ENCOUNTER — Other Ambulatory Visit (HOSPITAL_BASED_OUTPATIENT_CLINIC_OR_DEPARTMENT_OTHER): Payer: BC Managed Care – PPO | Admitting: Lab

## 2013-05-21 DIAGNOSIS — D693 Immune thrombocytopenic purpura: Secondary | ICD-10-CM

## 2013-05-21 LAB — CBC WITH DIFFERENTIAL/PLATELET
BASO%: 0.5 % (ref 0.0–2.0)
Basophils Absolute: 0 10*3/uL (ref 0.0–0.1)
EOS%: 1.7 % (ref 0.0–7.0)
HCT: 36.7 % (ref 34.8–46.6)
HGB: 11.9 g/dL (ref 11.6–15.9)
LYMPH%: 27.8 % (ref 14.0–49.7)
MCH: 25.7 pg (ref 25.1–34.0)
MCHC: 32.4 g/dL (ref 31.5–36.0)
NEUT%: 59 % (ref 38.4–76.8)
Platelets: 96 10*3/uL — ABNORMAL LOW (ref 145–400)
lymph#: 2.4 10*3/uL (ref 0.9–3.3)

## 2013-05-22 ENCOUNTER — Telehealth: Payer: Self-pay | Admitting: *Deleted

## 2013-05-22 NOTE — Telephone Encounter (Signed)
Message copied by Wende Mott on Thu May 22, 2013  3:25 PM ------      Message from: Clenton Pare R      Created: Thu May 22, 2013  8:46 AM       Please call plt count to patient. We can change her lab appts back to once a month if she is comfortable with this. Let me know and I can adjust appts. ------

## 2013-05-22 NOTE — Telephone Encounter (Signed)
Informed pt of Platelet count and can change labs to monthly now. Pt is in agreement with this plan.  She verbalized understanding to keep next lab appt on 7/30.  Other labs have been canceled.  She also verbalized understanding to call us for any unusual bleeding or bruising prior to next lab.

## 2013-05-28 ENCOUNTER — Other Ambulatory Visit: Payer: BC Managed Care – PPO | Admitting: Lab

## 2013-06-04 ENCOUNTER — Other Ambulatory Visit: Payer: BC Managed Care – PPO

## 2013-06-12 ENCOUNTER — Other Ambulatory Visit: Payer: Self-pay | Admitting: Obstetrics and Gynecology

## 2013-06-12 ENCOUNTER — Other Ambulatory Visit (HOSPITAL_COMMUNITY)
Admission: RE | Admit: 2013-06-12 | Discharge: 2013-06-12 | Disposition: A | Discharge status: Home / Self Care | Payer: BC Managed Care – PPO | Source: Ambulatory Visit | Attending: Obstetrics and Gynecology | Admitting: Obstetrics and Gynecology

## 2013-06-12 DIAGNOSIS — Z01419 Encounter for gynecological examination (general) (routine) without abnormal findings: Secondary | ICD-10-CM | POA: Insufficient documentation

## 2013-06-12 DIAGNOSIS — Z1151 Encounter for screening for human papillomavirus (HPV): Secondary | ICD-10-CM | POA: Insufficient documentation

## 2013-06-18 ENCOUNTER — Other Ambulatory Visit: Payer: BC Managed Care – PPO

## 2013-06-25 ENCOUNTER — Other Ambulatory Visit (HOSPITAL_BASED_OUTPATIENT_CLINIC_OR_DEPARTMENT_OTHER): Payer: BC Managed Care – PPO | Admitting: Lab

## 2013-06-25 DIAGNOSIS — D693 Immune thrombocytopenic purpura: Secondary | ICD-10-CM

## 2013-06-25 LAB — CBC WITH DIFFERENTIAL/PLATELET
BASO%: 1.1 % (ref 0.0–2.0)
Basophils Absolute: 0.1 10*3/uL (ref 0.0–0.1)
EOS%: 1.6 % (ref 0.0–7.0)
HCT: 39.2 % (ref 34.8–46.6)
HGB: 12.6 g/dL (ref 11.6–15.9)
MCH: 25.8 pg (ref 25.1–34.0)
MCHC: 32.1 g/dL (ref 31.5–36.0)
MCV: 80.2 fL (ref 79.5–101.0)
MONO%: 13.2 % (ref 0.0–14.0)
NEUT%: 52 % (ref 38.4–76.8)
RDW: 15.5 % — ABNORMAL HIGH (ref 11.2–14.5)
lymph#: 3.1 10*3/uL (ref 0.9–3.3)

## 2013-06-26 ENCOUNTER — Telehealth: Payer: Self-pay | Admitting: *Deleted

## 2013-06-26 NOTE — Telephone Encounter (Signed)
Message copied by Carola Rhine A on Thu Jun 26, 2013 10:03 AM ------      Message from: Clenton Pare R      Created: Thu Jun 26, 2013  8:56 AM       Please call pt. Plt count is better. Recommend continued observation. ------

## 2013-06-26 NOTE — Telephone Encounter (Signed)
Left patient a message on private voice mail with Kristen's note below.

## 2013-07-02 ENCOUNTER — Other Ambulatory Visit: Payer: BC Managed Care – PPO

## 2013-07-09 ENCOUNTER — Other Ambulatory Visit: Payer: BC Managed Care – PPO | Admitting: Lab

## 2013-07-16 ENCOUNTER — Other Ambulatory Visit: Payer: BC Managed Care – PPO | Admitting: Lab

## 2013-07-23 ENCOUNTER — Other Ambulatory Visit (HOSPITAL_BASED_OUTPATIENT_CLINIC_OR_DEPARTMENT_OTHER): Payer: BC Managed Care – PPO | Admitting: Lab

## 2013-07-23 DIAGNOSIS — D693 Immune thrombocytopenic purpura: Secondary | ICD-10-CM

## 2013-07-23 LAB — CBC WITH DIFFERENTIAL/PLATELET
BASO%: 0.6 % (ref 0.0–2.0)
EOS%: 1.8 % (ref 0.0–7.0)
MCH: 26.1 pg (ref 25.1–34.0)
MCV: 79.1 fL — ABNORMAL LOW (ref 79.5–101.0)
MONO%: 9.8 % (ref 0.0–14.0)
RBC: 4.98 10*6/uL (ref 3.70–5.45)
RDW: 15.9 % — ABNORMAL HIGH (ref 11.2–14.5)
lymph#: 2.3 10*3/uL (ref 0.9–3.3)
nRBC: 0 % (ref 0–0)

## 2013-07-24 ENCOUNTER — Telehealth: Payer: Self-pay | Admitting: *Deleted

## 2013-07-24 NOTE — Telephone Encounter (Signed)
Message copied by Wende Mott on Thu Jul 24, 2013  3:52 PM ------      Message from: Clenton Pare R      Created: Thu Jul 24, 2013  9:39 AM       Please call pt. Plt are down to 31,000. Does she have any bleeding? Recommend that we check CBC weekly for the next month to be sure that they do not drop further. ------

## 2013-07-24 NOTE — Telephone Encounter (Signed)
Pt denies any bleeding, just bruising.  Instructed pt to go to ED for any bleeding that does not stop, but to notify us if she has any bleeding such as gums or nose bleed.  Order to scheduler for weekly labs.  Pt verbalized understanding.

## 2013-07-25 ENCOUNTER — Telehealth: Payer: Self-pay | Admitting: Oncology

## 2013-07-25 NOTE — Telephone Encounter (Signed)
lvm for pt regarding to all Sept labs and OCT lab and visit

## 2013-07-30 ENCOUNTER — Other Ambulatory Visit (HOSPITAL_BASED_OUTPATIENT_CLINIC_OR_DEPARTMENT_OTHER): Payer: BC Managed Care – PPO | Admitting: Lab

## 2013-07-30 DIAGNOSIS — D693 Immune thrombocytopenic purpura: Secondary | ICD-10-CM

## 2013-07-30 LAB — CBC WITH DIFFERENTIAL/PLATELET
BASO%: 0.5 % (ref 0.0–2.0)
LYMPH%: 27 % (ref 14.0–49.7)
MCHC: 32.6 g/dL (ref 31.5–36.0)
MONO#: 0.9 10*3/uL (ref 0.1–0.9)
NEUT#: 5.4 10*3/uL (ref 1.5–6.5)
RBC: 4.89 10*6/uL (ref 3.70–5.45)
RDW: 16 % — ABNORMAL HIGH (ref 11.2–14.5)
WBC: 8.8 10*3/uL (ref 3.9–10.3)
lymph#: 2.4 10*3/uL (ref 0.9–3.3)
nRBC: 0 % (ref 0–0)

## 2013-07-31 ENCOUNTER — Telehealth: Payer: Self-pay | Admitting: *Deleted

## 2013-07-31 NOTE — Telephone Encounter (Signed)
Informed pt of Platelet count improved and wnl now.  Instructed to keep lab appt next week as scheduled. She verbalized understanding.

## 2013-08-06 ENCOUNTER — Other Ambulatory Visit (HOSPITAL_BASED_OUTPATIENT_CLINIC_OR_DEPARTMENT_OTHER): Payer: BC Managed Care – PPO | Admitting: Lab

## 2013-08-06 DIAGNOSIS — D693 Immune thrombocytopenic purpura: Secondary | ICD-10-CM

## 2013-08-06 LAB — CBC WITH DIFFERENTIAL/PLATELET
BASO%: 0.6 % (ref 0.0–2.0)
EOS%: 2.1 % (ref 0.0–7.0)
HCT: 37.5 % (ref 34.8–46.6)
MCH: 25.9 pg (ref 25.1–34.0)
MCHC: 32.5 g/dL (ref 31.5–36.0)
MCV: 79.6 fL (ref 79.5–101.0)
MONO%: 10.7 % (ref 0.0–14.0)
NEUT%: 62.6 % (ref 38.4–76.8)
RDW: 16.1 % — ABNORMAL HIGH (ref 11.2–14.5)
lymph#: 1.9 10*3/uL (ref 0.9–3.3)

## 2013-08-07 ENCOUNTER — Telehealth: Payer: Self-pay | Admitting: *Deleted

## 2013-08-07 NOTE — Telephone Encounter (Signed)
Informed pt of Platelet count and to keep lab appt next week as scheduled. She verbalized understanding.

## 2013-08-07 NOTE — Telephone Encounter (Signed)
Message copied by Wende Mott on Thu Aug 07, 2013  3:04 PM ------      Message from: Clenton Pare R      Created: Wed Aug 06, 2013  8:03 PM       Please call pt. Plt are 119. Continue observation. ------

## 2013-08-13 ENCOUNTER — Other Ambulatory Visit (HOSPITAL_BASED_OUTPATIENT_CLINIC_OR_DEPARTMENT_OTHER): Payer: BC Managed Care – PPO | Admitting: Lab

## 2013-08-13 DIAGNOSIS — D693 Immune thrombocytopenic purpura: Secondary | ICD-10-CM

## 2013-08-13 LAB — CBC WITH DIFFERENTIAL/PLATELET
BASO%: 0.6 % (ref 0.0–2.0)
EOS%: 1 % (ref 0.0–7.0)
Eosinophils Absolute: 0.1 10*3/uL (ref 0.0–0.5)
LYMPH%: 29 % (ref 14.0–49.7)
MCH: 25.8 pg (ref 25.1–34.0)
MCHC: 32.1 g/dL (ref 31.5–36.0)
MCV: 80.3 fL (ref 79.5–101.0)
MONO%: 10.5 % (ref 0.0–14.0)
NEUT#: 5.2 10*3/uL (ref 1.5–6.5)
RBC: 4.88 10*6/uL (ref 3.70–5.45)
RDW: 16 % — ABNORMAL HIGH (ref 11.2–14.5)
nRBC: 0 % (ref 0–0)

## 2013-08-14 ENCOUNTER — Telehealth: Payer: Self-pay | Admitting: *Deleted

## 2013-08-14 NOTE — Telephone Encounter (Signed)
Message copied by Wende Mott on Thu Aug 14, 2013  3:01 PM ------      Message from: Clenton Pare R      Created: Wed Aug 13, 2013 10:14 PM       Call pt. Plt are 80,000. No indication for treatment. Keep lab appts as scheduled. ------

## 2013-08-14 NOTE — Telephone Encounter (Signed)
Informed pt of platelet count and to keep lab appt next week as scheduled. She verbalized understanding.

## 2013-08-20 ENCOUNTER — Other Ambulatory Visit (HOSPITAL_BASED_OUTPATIENT_CLINIC_OR_DEPARTMENT_OTHER): Payer: BC Managed Care – PPO | Admitting: Lab

## 2013-08-20 DIAGNOSIS — D693 Immune thrombocytopenic purpura: Secondary | ICD-10-CM

## 2013-08-20 LAB — CBC WITH DIFFERENTIAL/PLATELET
BASO%: 0.6 % (ref 0.0–2.0)
EOS%: 1.5 % (ref 0.0–7.0)
MCH: 26.3 pg (ref 25.1–34.0)
MCHC: 32.7 g/dL (ref 31.5–36.0)
MONO#: 1.3 10*3/uL — ABNORMAL HIGH (ref 0.1–0.9)
RBC: 4.87 10*6/uL (ref 3.70–5.45)
RDW: 15.9 % — ABNORMAL HIGH (ref 11.2–14.5)
WBC: 10.8 10*3/uL — ABNORMAL HIGH (ref 3.9–10.3)
lymph#: 2.8 10*3/uL (ref 0.9–3.3)
nRBC: 0 % (ref 0–0)

## 2013-08-22 ENCOUNTER — Other Ambulatory Visit: Payer: Self-pay

## 2013-08-22 ENCOUNTER — Other Ambulatory Visit: Payer: Self-pay | Admitting: Hematology and Oncology

## 2013-08-22 ENCOUNTER — Telehealth: Payer: Self-pay | Admitting: *Deleted

## 2013-08-22 DIAGNOSIS — D693 Immune thrombocytopenic purpura: Secondary | ICD-10-CM

## 2013-08-22 NOTE — Telephone Encounter (Signed)
Per staff phone call and POF I have schedueld appts. I have also called and left the patient a message with appts  JMW

## 2013-08-27 ENCOUNTER — Telehealth: Payer: Self-pay | Admitting: Hematology and Oncology

## 2013-08-27 ENCOUNTER — Ambulatory Visit (HOSPITAL_BASED_OUTPATIENT_CLINIC_OR_DEPARTMENT_OTHER): Payer: BC Managed Care – PPO | Admitting: Hematology and Oncology

## 2013-08-27 ENCOUNTER — Encounter: Payer: Self-pay | Admitting: Hematology and Oncology

## 2013-08-27 ENCOUNTER — Other Ambulatory Visit (HOSPITAL_BASED_OUTPATIENT_CLINIC_OR_DEPARTMENT_OTHER): Payer: BC Managed Care – PPO | Admitting: Lab

## 2013-08-27 VITALS — BP 120/71 | HR 90 | Temp 98.0°F | Resp 18 | Ht 64.0 in | Wt 182.2 lb

## 2013-08-27 DIAGNOSIS — D693 Immune thrombocytopenic purpura: Secondary | ICD-10-CM

## 2013-08-27 DIAGNOSIS — R5383 Other fatigue: Secondary | ICD-10-CM

## 2013-08-27 DIAGNOSIS — D649 Anemia, unspecified: Secondary | ICD-10-CM

## 2013-08-27 DIAGNOSIS — Z23 Encounter for immunization: Secondary | ICD-10-CM

## 2013-08-27 DIAGNOSIS — D509 Iron deficiency anemia, unspecified: Secondary | ICD-10-CM | POA: Insufficient documentation

## 2013-08-27 DIAGNOSIS — Z9089 Acquired absence of other organs: Secondary | ICD-10-CM

## 2013-08-27 DIAGNOSIS — R5381 Other malaise: Secondary | ICD-10-CM

## 2013-08-27 HISTORY — DX: Other fatigue: R53.83

## 2013-08-27 HISTORY — DX: Encounter for immunization: Z23

## 2013-08-27 LAB — COMPREHENSIVE METABOLIC PANEL (CC13)
ALT: 6 U/L (ref 0–55)
Albumin: 3.6 g/dL (ref 3.5–5.0)
BUN: 8.1 mg/dL (ref 7.0–26.0)
CO2: 26 mEq/L (ref 22–29)
Calcium: 9.4 mg/dL (ref 8.4–10.4)
Chloride: 104 mEq/L (ref 98–109)
Creatinine: 0.8 mg/dL (ref 0.6–1.1)
Glucose: 79 mg/dl (ref 70–140)
Total Bilirubin: 0.62 mg/dL (ref 0.20–1.20)

## 2013-08-27 LAB — CBC WITH DIFFERENTIAL/PLATELET
Basophils Absolute: 0.1 10*3/uL (ref 0.0–0.1)
EOS%: 1.5 % (ref 0.0–7.0)
Eosinophils Absolute: 0.1 10*3/uL (ref 0.0–0.5)
HGB: 13.6 g/dL (ref 11.6–15.9)
MCV: 79.6 fL (ref 79.5–101.0)
MONO%: 9 % (ref 0.0–14.0)
NEUT#: 5 10*3/uL (ref 1.5–6.5)
RBC: 5.14 10*6/uL (ref 3.70–5.45)
RDW: 15.7 % — ABNORMAL HIGH (ref 11.2–14.5)
lymph#: 2 10*3/uL (ref 0.9–3.3)
nRBC: 0 % (ref 0–0)

## 2013-08-27 LAB — LACTATE DEHYDROGENASE (CC13): LDH: 187 U/L (ref 125–245)

## 2013-08-27 LAB — IRON AND TIBC CHCC
%SAT: 23 % (ref 21–57)
TIBC: 308 ug/dL (ref 236–444)
UIBC: 237 ug/dL (ref 120–384)

## 2013-08-27 LAB — FERRITIN CHCC: Ferritin: 16 ng/ml (ref 9–269)

## 2013-08-27 MED ORDER — INFLUENZA VAC SPLIT QUAD 0.5 ML IM SUSP
0.5000 mL | INTRAMUSCULAR | Status: AC
  Administered 2013-08-27: 0.5 mL via INTRAMUSCULAR
  Filled 2013-08-27: qty 0.5

## 2013-08-27 NOTE — Telephone Encounter (Signed)
Gave pt appt for 10/29 lab and MD, cancelled appt for ML and both infusion for this week

## 2013-08-27 NOTE — Progress Notes (Signed)
[facility] OFFICE PROGRESS NOTE  Astrid Divine, MD DIAGNOSIS: Chronic ITP, for further management  SUMMARY OF HEMATOLOGIC HISTORY: This is a very pleasant 32 year old lady with history of chronic ITP. 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 She responded well to prednisone but due to the excessive weight gain and other side effects, prefer not to be treated with prednisone. When she was treated with rituximab before 2010, she did develop significant allergic reaction. Her last treatment with IVIG was in 2011. Currently she's been placed on observation with blood work done several times a month INTERVAL HISTORY: MICHAELIA BEILFUSS 32 y.o. female returns for further followup. She is concerned about potential worsening thrombocytopenia that was noted on her most recent blood work. She had a very minor nosebleed this morning from the left nostril after she blew her nose. She denies any recent infection. No recent fever or chills. She is complaining of persistent fatigue requiring naps occasionally. She denies any easy bruising. No recent hematuria, hematochezia, or excessive menstruation.   I have reviewed the past medical history, past surgical history, social history and family history with the patient and they are unchanged from previous note.  ALLERGIES:  is allergic to rituximab.  MEDICATIONS: Current outpatient prescriptions:cyclobenzaprine (FLEXERIL) 10 MG tablet, Take 10 mg by mouth 3 (three) times daily as needed., Disp: , Rfl: ;  Norgestim-Eth Estrad Triphasic (ORTHO TRI-CYCLEN LO PO), Take 1 tablet by mouth daily., Disp: , Rfl:   REVIEW OF SYSTEMS:   Constitutional: Denies fevers, chills or abnormal weight loss Eyes: Denies blurriness of vision Ears, nose, mouth, throat, and face: Denies mucositis or sore throat Respiratory: Denies cough, dyspnea or wheezes Cardiovascular: Denies palpitation, chest discomfort or lower  extremity swelling Gastrointestinal:  Denies nausea, heartburn or change in bowel habits Skin: Denies abnormal skin rashes Lymphatics: Denies new lymphadenopathy or easy bruising Neurological:Denies numbness, tingling or new weaknesses Behavioral/Psych: Mood is stable, no new changes  All other systems were reviewed with the patient and are negative.  PHYSICAL EXAMINATION: ECOG PERFORMANCE STATUS: 1 - Symptomatic but completely ambulatory  Filed Vitals:   08/27/13 1023  BP: 120/71  Pulse: 90  Temp: 98 F (36.7 C)  Resp: 18   Filed Weights   08/27/13 1023  Weight: 182 lb 3.2 oz (82.645 kg)     GENERAL:alert, no distress and comfortable patient is moderately obese  SKIN: skin color, texture, turgor are normal, no rashes or significant lesions EYES: normal, Conjunctiva are pink and non-injected, sclera clear OROPHARYNX:no exudate, no erythema and lips, buccal mucosa, and tongue normal  NECK: supple, thyroid normal size, non-tender, without nodularity LYMPH:  no palpable lymphadenopathy in the cervical, axillary or inguinal LUNGS: clear to auscultation and percussion with normal breathing effort HEART: regular rate & rhythm and no murmurs and no lower extremity edema ABDOMEN:abdomen soft, non-tender and normal bowel sounds well-healed surgical scar  Musculoskeletal:no cyanosis of digits and no clubbing  NEURO: alert & oriented x 3 with fluent speech, no focal motor/sensory deficits  LABORATORY DATA:  I have reviewed the data as listed Results for orders placed in visit on 08/27/13 (from the past 48 hour(s))  CBC WITH DIFFERENTIAL     Status: Abnormal   Collection Time    08/27/13 10:02 AM      Result Value Range   WBC 7.8  3.9 - 10.3 10e3/uL   NEUT# 5.0  1.5 - 6.5 10e3/uL   HGB 13.6  11.6 -  15.9 g/dL   HCT 16.1  09.6 - 04.5 %   Platelets 112 (*) 145 - 400 10e3/uL   MCV 79.6  79.5 - 101.0 fL   MCH 26.5  25.1 - 34.0 pg   MCHC 33.3  31.5 - 36.0 g/dL   RBC 4.09  8.11 - 9.14  10e6/uL   RDW 15.7 (*) 11.2 - 14.5 %   lymph# 2.0  0.9 - 3.3 10e3/uL   MONO# 0.7  0.1 - 0.9 10e3/uL   Eosinophils Absolute 0.1  0.0 - 0.5 10e3/uL   Basophils Absolute 0.1  0.0 - 0.1 10e3/uL   NEUT% 63.5  38.4 - 76.8 %   LYMPH% 25.4  14.0 - 49.7 %   MONO% 9.0  0.0 - 14.0 %   EOS% 1.5  0.0 - 7.0 %   BASO% 0.6  0.0 - 2.0 %   nRBC 0  0 - 0 %  HOLD TUBE, BLOOD BANK     Status: None   Collection Time    08/27/13 10:02 AM      Result Value Range   Hold Tube, Blood Bank Blood Bank Order Cancelled    LACTATE DEHYDROGENASE (CC13)     Status: None   Collection Time    08/27/13 10:02 AM      Result Value Range   LDH 187  125 - 245 U/L  COMPREHENSIVE METABOLIC PANEL (CC13)     Status: None   Collection Time    08/27/13 10:02 AM      Result Value Range   Sodium 139  136 - 145 mEq/L   Potassium 3.8  3.5 - 5.1 mEq/L   Chloride 104  98 - 109 mEq/L   CO2 26  22 - 29 mEq/L   Glucose 79  70 - 140 mg/dl   BUN 8.1  7.0 - 78.2 mg/dL   Creatinine 0.8  0.6 - 1.1 mg/dL   Total Bilirubin 9.56  0.20 - 1.20 mg/dL   Alkaline Phosphatase 77  40 - 150 U/L   AST 15  5 - 34 U/L   ALT 6  0 - 55 U/L   Total Protein 7.8  6.4 - 8.3 g/dL   Albumin 3.6  3.5 - 5.0 g/dL   Calcium 9.4  8.4 - 21.3 mg/dL     ASSESSMENT:  chronic ITP   PLAN:   #1 chronic ITP The patient is currently being placed on observation. Her platelet count is satisfactory today. I told the patient's changes with her menstrual cycle, stress, infection, could potentially cause worsening thrombocytopenia. She's been placed on observation fall long time without need for treatment. The patient is instructed to report for any signs and symptoms of bleeding. Otherwise I am going to lengthen her followup and blood work until a month from now. I will go ahead and cancel IVIG that was scheduled for tomorrow. In the future, if she needs treatment, we would treat her again with IVIG established a threshold to only stop treatment if it fall below  20,000. #2 fatigue Am checking her thyroid function tests today. Her most recent blood work show microcytosis could suggest potential iron deficiency that could also cause occasional fatigue. I will call the patient if her blood tests come back abnormal. We discussed about other potential option of treatment for IVIG including prednisone, immunosuppressant, or Promacta. #3 preventive care Due to absence of spleen, I recommend influenza vaccination today and she is in agreement to proceed. All questions were answered. The patient  knows to call the clinic with any problems, questions or concerns. We can certainly see the patient much sooner if necessary. No barriers to learning was detected.  I spent 25 minutes counseling the patient face to face. The total time spent in the appointment was 40 minutes and more than 50% was on counseling.     Lateria Alderman, MD 08/27/2013 11:46 AM

## 2013-08-28 ENCOUNTER — Ambulatory Visit: Payer: BC Managed Care – PPO

## 2013-08-29 ENCOUNTER — Telehealth: Payer: Self-pay | Admitting: *Deleted

## 2013-08-29 ENCOUNTER — Ambulatory Visit: Payer: BC Managed Care – PPO

## 2013-08-29 NOTE — Telephone Encounter (Signed)
Message copied by Reesa Chew on Fri Aug 29, 2013  4:27 PM ------      Message from: Canby, Virginia E      Created: Fri Aug 29, 2013  2:50 PM       Will you please call, thanks!!            ----- Message -----         From: Artis Delay, MD         Sent: 08/28/2013   8:42 PM           To: Amy Lillia Mountain, RN            Please call pt      Her thyroid test is ok      Iron test is borderline...would not recommend any supplement for now ------

## 2013-08-29 NOTE — Telephone Encounter (Signed)
Spoke with patient re: recent labs

## 2013-09-17 ENCOUNTER — Other Ambulatory Visit: Payer: BC Managed Care – PPO | Admitting: Lab

## 2013-09-17 ENCOUNTER — Ambulatory Visit: Payer: BC Managed Care – PPO | Admitting: Oncology

## 2013-09-24 ENCOUNTER — Other Ambulatory Visit: Payer: Self-pay | Admitting: Hematology and Oncology

## 2013-09-24 ENCOUNTER — Telehealth: Payer: Self-pay | Admitting: Hematology and Oncology

## 2013-09-24 ENCOUNTER — Other Ambulatory Visit (HOSPITAL_BASED_OUTPATIENT_CLINIC_OR_DEPARTMENT_OTHER): Payer: BC Managed Care – PPO | Admitting: Lab

## 2013-09-24 ENCOUNTER — Encounter (INDEPENDENT_AMBULATORY_CARE_PROVIDER_SITE_OTHER): Payer: Self-pay

## 2013-09-24 ENCOUNTER — Ambulatory Visit (HOSPITAL_BASED_OUTPATIENT_CLINIC_OR_DEPARTMENT_OTHER): Payer: BC Managed Care – PPO | Admitting: Hematology and Oncology

## 2013-09-24 VITALS — BP 111/71 | HR 95 | Temp 98.0°F | Resp 20 | Ht 64.0 in | Wt 183.0 lb

## 2013-09-24 DIAGNOSIS — R5381 Other malaise: Secondary | ICD-10-CM

## 2013-09-24 DIAGNOSIS — D693 Immune thrombocytopenic purpura: Secondary | ICD-10-CM

## 2013-09-24 DIAGNOSIS — D509 Iron deficiency anemia, unspecified: Secondary | ICD-10-CM

## 2013-09-24 LAB — CBC WITH DIFFERENTIAL/PLATELET
Basophils Absolute: 0.1 10*3/uL (ref 0.0–0.1)
EOS%: 1.4 % (ref 0.0–7.0)
HCT: 38.4 % (ref 34.8–46.6)
HGB: 12.2 g/dL (ref 11.6–15.9)
MCH: 25.8 pg (ref 25.1–34.0)
MONO#: 0.8 10*3/uL (ref 0.1–0.9)
NEUT#: 5.6 10*3/uL (ref 1.5–6.5)
RDW: 15.2 % — ABNORMAL HIGH (ref 11.2–14.5)
WBC: 9.1 10*3/uL (ref 3.9–10.3)
lymph#: 2.4 10*3/uL (ref 0.9–3.3)

## 2013-09-24 NOTE — Progress Notes (Signed)
[facility] OFFICE PROGRESS NOTE  Brandy Divine, MD DIAGNOSIS:  Chronic ITP  SUMMARY OF HEMATOLOGIC HISTORY: This is a very pleasant 32 year old lady with history of chronic ITP. 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 She responded well to prednisone but due to the excessive weight gain and other side effects, prefer not to be treated with prednisone. When she was treated with rituximab before 2010, she did develop significant allergic reaction. Her last treatment with IVIG was in 2011. Currently she's been placed on observation with blood work done several times a month INTERVAL HISTORY: [name] 32 y.o. female returns for further followup. The patient started to have vaginal spotting. She has stopped taking her birth control pills 3 months ago as she was trying to get pregnant. The patient denies any recent signs or symptoms of bleeding such as spontaneous epistaxis, hematuria or hematochezia.  I have reviewed the past medical history, past surgical history, social history and family history with the patient and they are unchanged from previous note.  ALLERGIES:  is allergic to rituximab.  MEDICATIONS:  Current Outpatient Prescriptions  Medication Sig Dispense Refill  . cyclobenzaprine (FLEXERIL) 10 MG tablet Take 10 mg by mouth 3 (three) times daily as needed.       No current facility-administered medications for this visit.     REVIEW OF SYSTEMS:   Constitutional: Denies fevers, chills or night sweats Eyes: Denies blurriness of vision Ears, nose, mouth, throat, and face: Denies mucositis or sore throat Respiratory: Denies cough, dyspnea or wheezes Cardiovascular: Denies palpitation, chest discomfort or lower extremity swelling Gastrointestinal:  Denies nausea, heartburn or change in bowel habits Skin: Denies abnormal skin rashes Lymphatics: Denies new lymphadenopathy or easy  bruising Neurological:Denies numbness, tingling or new weaknesses Behavioral/Psych: Mood is stable, no new changes  All other systems were reviewed with the patient and are negative.  PHYSICAL EXAMINATION: ECOG PERFORMANCE STATUS: 0 - Asymptomatic  Filed Vitals:   09/24/13 1440  BP: 111/71  Pulse: 95  Temp: 98 F (36.7 C)  Resp: 20   Filed Weights   09/24/13 1440  Weight: 183 lb (83.008 kg)    GENERAL:alert, no distress and comfortable NEURO: alert & oriented x 3 with fluent speech, no focal motor/sensory deficits  LABORATORY DATA:  I have reviewed the data as listed Results for orders placed in visit on 09/24/13 (from the past 48 hour(s))  CBC WITH DIFFERENTIAL     Status: Abnormal   Collection Time    09/24/13  2:31 PM      Result Value Range   WBC 9.1  3.9 - 10.3 10e3/uL   NEUT# 5.6  1.5 - 6.5 10e3/uL   HGB 12.2  11.6 - 15.9 g/dL   HCT 14.7  82.9 - 56.2 %   Platelets 79 (*) 145 - 400 10e3/uL   MCV 81.2  79.5 - 101.0 fL   MCH 25.8  25.1 - 34.0 pg   MCHC 31.7  31.5 - 36.0 g/dL   RBC 1.30  8.65 - 7.84 10e6/uL   RDW 15.2 (*) 11.2 - 14.5 %   lymph# 2.4  0.9 - 3.3 10e3/uL   MONO# 0.8  0.1 - 0.9 10e3/uL   Eosinophils Absolute 0.1  0.0 - 0.5 10e3/uL   Basophils Absolute 0.1  0.0 - 0.1 10e3/uL   NEUT% 62.1  38.4 - 76.8 %   LYMPH% 26.0  14.0 - 49.7 %   MONO% 9.2  0.0 -  14.0 %   EOS% 1.4  0.0 - 7.0 %   BASO% 1.3  0.0 - 2.0 %    ASSESSMENT:  Chronic ITP  PLAN:  #1 chronic ITP I will add an ANA test in her next visit to make sure we rule out systemic lupus. Her platelet count has dropped again, I suspect related to her menstrual cycle. 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 Apart from vaginal spotting, patient denies recent history of bleeding such as epistaxis, hematuria or hematochezia. She is asymptomatic from the low platelet count. I will observe for now.  she does not require transfusion now. The patient is aware she needs to come back on a weekly basis and  have her platelet count checked. If her platelet count dropped to less than 50,000, I might want to retreat her again with IVIG. I will continue to see her on a monthly basis. #2 fatigue Her thyroid function test is normal. Her iron studies suggest a mild borderline iron deficiency. #3 iron deficiency I recommend not to treat her iron deficiency right now as it can cause thrombocytopenia to get worse. The patient currently is not anemic. All questions were answered. The patient knows to call the clinic with any problems, questions or concerns. No barriers to learning was detected.  I spent 15 minutes counseling the patient face to face. The total time spent in the appointment was 20 minutes and more than 50% was on counseling.     [facility], Brandy Needles, MD 09/24/2013 3:13 PM

## 2013-09-24 NOTE — Telephone Encounter (Signed)
GAve pt appt for labs and MD for November and December 2014

## 2013-10-01 ENCOUNTER — Other Ambulatory Visit (HOSPITAL_BASED_OUTPATIENT_CLINIC_OR_DEPARTMENT_OTHER): Payer: BC Managed Care – PPO | Admitting: Lab

## 2013-10-01 DIAGNOSIS — D693 Immune thrombocytopenic purpura: Secondary | ICD-10-CM

## 2013-10-01 LAB — CBC WITH DIFFERENTIAL/PLATELET
Basophils Absolute: 0 10*3/uL (ref 0.0–0.1)
EOS%: 1.8 % (ref 0.0–7.0)
Eosinophils Absolute: 0.1 10*3/uL (ref 0.0–0.5)
LYMPH%: 32.8 % (ref 14.0–49.7)
MCH: 26.2 pg (ref 25.1–34.0)
MCV: 80.5 fL (ref 79.5–101.0)
MONO%: 10.2 % (ref 0.0–14.0)
NEUT#: 4.3 10*3/uL (ref 1.5–6.5)
RBC: 4.62 10*6/uL (ref 3.70–5.45)
RDW: 15.2 % — ABNORMAL HIGH (ref 11.2–14.5)
lymph#: 2.6 10*3/uL (ref 0.9–3.3)

## 2013-10-02 LAB — ANA: Anti Nuclear Antibody(ANA): NEGATIVE

## 2013-10-27 ENCOUNTER — Ambulatory Visit (HOSPITAL_BASED_OUTPATIENT_CLINIC_OR_DEPARTMENT_OTHER): Payer: BC Managed Care – PPO | Admitting: Hematology and Oncology

## 2013-10-27 ENCOUNTER — Other Ambulatory Visit (HOSPITAL_BASED_OUTPATIENT_CLINIC_OR_DEPARTMENT_OTHER): Payer: BC Managed Care – PPO | Admitting: Lab

## 2013-10-27 ENCOUNTER — Encounter: Payer: Self-pay | Admitting: Hematology and Oncology

## 2013-10-27 ENCOUNTER — Telehealth: Payer: Self-pay | Admitting: Hematology and Oncology

## 2013-10-27 VITALS — BP 113/67 | HR 105 | Temp 98.3°F | Resp 18 | Ht 64.0 in | Wt 185.7 lb

## 2013-10-27 DIAGNOSIS — D693 Immune thrombocytopenic purpura: Secondary | ICD-10-CM

## 2013-10-27 LAB — CBC WITH DIFFERENTIAL/PLATELET
BASO%: 0.7 % (ref 0.0–2.0)
Eosinophils Absolute: 0.1 10*3/uL (ref 0.0–0.5)
LYMPH%: 24.7 % (ref 14.0–49.7)
MCH: 26.1 pg (ref 25.1–34.0)
MCHC: 32.6 g/dL (ref 31.5–36.0)
MONO#: 0.8 10*3/uL (ref 0.1–0.9)
NEUT#: 5.5 10*3/uL (ref 1.5–6.5)
Platelets: 80 10*3/uL — ABNORMAL LOW (ref 145–400)
RBC: 4.68 10*6/uL (ref 3.70–5.45)
RDW: 15 % — ABNORMAL HIGH (ref 11.2–14.5)
WBC: 8.6 10*3/uL (ref 3.9–10.3)
lymph#: 2.1 10*3/uL (ref 0.9–3.3)
nRBC: 0 % (ref 0–0)

## 2013-10-27 MED ORDER — AMINOCAPROIC ACID 500 MG PO TABS: 500.0000 mg | ORAL_TABLET | Freq: Four times a day (QID) | ORAL | Status: DC

## 2013-10-27 NOTE — Patient Instructions (Signed)
Aminocaproic Acid tablets What is this medicine? AMINOCAPROIC ACID (a mee noe ka PROE ik AS id) slows down or stops blood clots from being broken down. This medicine helps to prevent or treat excessive bleeding. This medicine may be used for other purposes; ask your health care provider or pharmacist if you have questions. COMMON BRAND NAME(S): Amicar What should I tell my health care provider before I take this medicine? They need to know if you have any of these conditions: -blood clotting problems -blood in urine -kidney disease -an unusual or allergic reaction to aminocaproic acid, other medicines, foods, dyes, or preservatives -pregnant or trying to get pregnant -breast-feeding How should I use this medicine? Take this medicine by mouth with a glass of water. Follow the directions on the prescription label. Take your doses at regular intervals. Do not take your medicine more often than directed. Do not stop taking except on the advice of your doctor or health care professional. Talk to your pediatrician regarding the use of this medicine in children. Special care may be needed. Overdosage: If you think you have taken too much of this medicine contact a poison control center or emergency room at once. NOTE: This medicine is only for you. Do not share this medicine with others. What if I miss a dose? If you miss a dose, take it as soon as you can. If it is almost time for your next dose, take only that dose. Do not take double or extra doses. What may interact with this medicine? Do not take this medicine with any of the following medications: -agents that dissolve blood clots This medicine may also interact with the following medications: -factor IX This list may not describe all possible interactions. Give your health care provider a list of all the medicines, herbs, non-prescription drugs, or dietary supplements you use. Also tell them if you smoke, drink alcohol, or use illegal drugs.  Some items may interact with your medicine. What should I watch for while using this medicine? Visit your doctor or health care professional for regular checks on your progress. If you take this medicine for a long time your doctor or health care professional will schedule tests to make sure the medicine is working properly. You may get drowsy or dizzy. Do not drive, use machinery, or do anything that needs mental alertness until you know how this medicine affects you. Do not stand or sit up quickly, especially if you are an older patient. This reduces the risk of dizzy or fainting spells. What side effects may I notice from receiving this medicine? Side effects that you should report to your doctor or health care professional as soon as possible: -allergic reactions like skin rash, itching or hives, swelling of the face, lips, or tongue -breathing problems -changes in vision -chest pain -muscle aches or pains -problems with balance, talking, walking -ringing in the ears -seizure -sudden or severe pain in the chest, legs, head, or groin -trouble passing urine or change in the amount of urine -unexplained weight gain Side effects that usually do not require medical attention (report to your doctor or health care professional if they continue or are bothersome): -change in sex drive or performance -diarrhea -headache -nausea, vomiting -unusual menstrual pain -unusually tired This list may not describe all possible side effects. Call your doctor for medical advice about side effects. You may report side effects to FDA at 1-800-FDA-1088. Where should I keep my medicine? Keep out of the reach of children. Store at  room temperature between 15 and 30 degrees C (59 and 86 degrees F). Keep container tightly closed. Throw away any unused medicine after the expiration date. NOTE: This sheet is a summary. It may not cover all possible information. If you have questions about this medicine, talk to  your doctor, pharmacist, or health care provider.  2014, [employer]vier/Gold Standard. (2008-02-05 11:34:49)

## 2013-10-27 NOTE — Progress Notes (Signed)
[name]  Brandy Divine, MD DIAGNOSIS:  Chronic ITP  SUMMARY OF HEMATOLOGIC HISTORY: This is a very pleasant 32 year old lady with history of chronic ITP. 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 She responded well to prednisone but due to the excessive weight gain and other side effects, prefer not to be treated with prednisone. When she was treated with rituximab before 2010, she did develop significant allergic reaction. Her last treatment with IVIG was in 2011. Currently she's been placed on observation with blood work done several times a month INTERVAL HISTORY: [name] 32 y.o. female returns for followup visit. She has significant pain from her wisdom teeth and has planned for dental extraction in 3 days time. The last time she took nonsteroidal anti-inflammatory medications was a week ago. The patient denies any recent signs or symptoms of bleeding such as spontaneous epistaxis, hematuria or hematochezia. She just finished another cycle of menstrual period and denies any excessive bleeding.  I have reviewed the past medical history, past surgical history, social history and family history with the patient and they are unchanged from previous [name].  ALLERGIES:  is allergic to rituximab.  MEDICATIONS:  Current Outpatient Prescriptions  Medication Sig Dispense Refill  . cyclobenzaprine (FLEXERIL) 10 MG tablet Take 10 mg by mouth 3 (three) times daily as needed.      Marland Kitchen aminocaproic acid (AMICAR) 500 MG tablet Take 1 tablet (500 mg total) by mouth every 6 (six) hours.  20 tablet  0   No current facility-administered medications for this visit.     REVIEW OF SYSTEMS:   Constitutional: Denies fevers, chills or night sweats Eyes: Denies blurriness of vision Ears, nose, mouth, throat, and face: Denies mucositis or sore throat Respiratory: Denies cough, dyspnea or  wheezes Cardiovascular: Denies palpitation, chest discomfort or lower extremity swelling Gastrointestinal:  Denies nausea, heartburn or change in bowel habits Skin: Denies abnormal skin rashes Lymphatics: Denies new lymphadenopathy or easy bruising Neurological:Denies numbness, tingling or new weaknesses Behavioral/Psych: Mood is stable, no new changes  All other systems were reviewed with the patient and are negative.  PHYSICAL EXAMINATION: ECOG PERFORMANCE STATUS: 0 - Asymptomatic  Filed Vitals:   10/27/13 1450  BP: 113/67  Pulse: 105  Temp: 98.3 F (36.8 C)  Resp: 18   Filed Weights   10/27/13 1450  Weight: 185 lb 11.2 oz (84.233 kg)    GENERAL:alert, no distress and comfortable SKIN: skin color, texture, turgor are normal, no rashes or significant lesions EYES: normal, Conjunctiva are pink and non-injected, sclera clear OROPHARYNX:no exudate, no erythema and lips, buccal mucosa, and tongue normal . Noted impacted wisdom teeth bilateral lower molars NECK: supple, thyroid normal size, non-tender, without nodularity LYMPH:  no palpable lymphadenopathy in the cervical, axillary or inguinal LUNGS: clear to auscultation and percussion with normal breathing effort HEART: regular rate & rhythm and no murmurs and no lower extremity edema ABDOMEN:abdomen soft, non-tender and normal bowel sounds Musculoskeletal:no cyanosis of digits and no clubbing  NEURO: alert & oriented x 3 with fluent speech, no focal motor/sensory deficits  LABORATORY DATA:  I have reviewed the data as listed Results for orders placed in visit on 10/27/13 (from the past 48 hour(s))  CBC WITH DIFFERENTIAL     Status: Abnormal   Collection Time    10/27/13  2:38 PM      Result Value Range   WBC 8.6  3.9 - 10.3 10e3/uL  NEUT# 5.5  1.5 - 6.5 10e3/uL   HGB 12.2  11.6 - 15.9 g/dL   HCT 16.1  09.6 - 04.5 %   Platelets 80 (*) 145 - 400 10e3/uL   MCV 79.9  79.5 - 101.0 fL   MCH 26.1  25.1 - 34.0 pg   MCHC  32.6  31.5 - 36.0 g/dL   RBC 4.09  8.11 - 9.14 10e6/uL   RDW 15.0 (*) 11.2 - 14.5 %   lymph# 2.1  0.9 - 3.3 10e3/uL   MONO# 0.8  0.1 - 0.9 10e3/uL   Eosinophils Absolute 0.1  0.0 - 0.5 10e3/uL   Basophils Absolute 0.1  0.0 - 0.1 10e3/uL   NEUT% 64.2  38.4 - 76.8 %   LYMPH% 24.7  14.0 - 49.7 %   MONO% 9.4  0.0 - 14.0 %   EOS% 1.0  0.0 - 7.0 %   BASO% 0.7  0.0 - 2.0 %   nRBC 0  0 - 0 %    Lab Results  Component Value Date   WBC 8.6 10/27/2013   HGB 12.2 10/27/2013   HCT 37.4 10/27/2013   MCV 79.9 10/27/2013   PLT 80* 10/27/2013   ASSESSMENT & PLAN:  #1 chronic ITP She does not require treatment for ITP. However I am concerned about the planned dental extraction. Due to her low platelet count, she would be at moderate risk of bleeding. #2 impacted wisdom teeth She has planned dental extraction this week. The procedures in the morning. I recommend she start taking Amicar 500 mg in the morning before her procedure. I told her to avoid further NSAIDS ingestion. I do not think she will require perioperative platelet transfusion. On the same day after her procedure I will see her back in the clinic with repeat blood work and close monitoring. I gave her hematology clearance letter to bring to the dentist to proceed with the dental extraction. The letter is not a guarantee that she would not develop perioperative bleeding complications. Side effects of Amicar was explained to the patient. Prescription is given. Patient education materials are dispensed. All questions were answered. The patient knows to call the clinic with any problems, questions or concerns. No barriers to learning was detected.    [facility], Kyrsten Deleeuw, MD 10/27/2013 3:09 PM

## 2013-10-27 NOTE — Telephone Encounter (Signed)
aapts made per 12/1 POF AVS and CAL given shh

## 2013-10-30 ENCOUNTER — Other Ambulatory Visit (HOSPITAL_BASED_OUTPATIENT_CLINIC_OR_DEPARTMENT_OTHER): Payer: BC Managed Care – PPO | Admitting: Lab

## 2013-10-30 ENCOUNTER — Ambulatory Visit (HOSPITAL_BASED_OUTPATIENT_CLINIC_OR_DEPARTMENT_OTHER): Payer: BC Managed Care – PPO | Admitting: Hematology and Oncology

## 2013-10-30 ENCOUNTER — Telehealth: Payer: Self-pay | Admitting: *Deleted

## 2013-10-30 ENCOUNTER — Telehealth: Payer: Self-pay | Admitting: Hematology and Oncology

## 2013-10-30 VITALS — BP 127/73 | HR 84 | Temp 97.9°F | Resp 18 | Ht 64.0 in | Wt 183.5 lb

## 2013-10-30 DIAGNOSIS — D693 Immune thrombocytopenic purpura: Secondary | ICD-10-CM

## 2013-10-30 LAB — CBC WITH DIFFERENTIAL/PLATELET
Basophils Absolute: 0 10*3/uL (ref 0.0–0.1)
EOS%: 0.4 % (ref 0.0–7.0)
HCT: 38.5 % (ref 34.8–46.6)
HGB: 12.7 g/dL (ref 11.6–15.9)
LYMPH%: 15.5 % (ref 14.0–49.7)
MCH: 26.4 pg (ref 25.1–34.0)
MCV: 80 fL (ref 79.5–101.0)
MONO%: 9.1 % (ref 0.0–14.0)
NEUT#: 7.1 10*3/uL — ABNORMAL HIGH (ref 1.5–6.5)
RBC: 4.81 10*6/uL (ref 3.70–5.45)
RDW: 14.8 % — ABNORMAL HIGH (ref 11.2–14.5)
lymph#: 1.5 10*3/uL (ref 0.9–3.3)
nRBC: 0 % (ref 0–0)

## 2013-10-30 MED ORDER — AMINOCAPROIC ACID 500 MG PO TABS: 500.0000 mg | ORAL_TABLET | Freq: Four times a day (QID) | ORAL | Status: DC

## 2013-10-30 NOTE — Telephone Encounter (Signed)
gv pt appt schedule for january 2015 °

## 2013-10-30 NOTE — Progress Notes (Signed)
[facility] OFFICE PROGRESS NOTE  Astrid Divine, MD DIAGNOSIS:  Chronic ITP, status post dental extraction  SUMMARY OF HEMATOLOGIC HISTORY: Please see my detailed dictation 3 days ago for further details. INTERVAL HISTORY: [name] 32 y.o. female returns for further followup following dental extraction. She has been spitting up blood tinged sputum every few minutes. Denies any bleeding elsewhere. The patient has not started Amicar due to cost issues  I have reviewed the past medical history, past surgical history, social history and family history with the patient and they are unchanged from previous note.  ALLERGIES:  is allergic to rituximab.  MEDICATIONS:  Current Outpatient Prescriptions  Medication Sig Dispense Refill  . HYDROcodone-acetaminophen (NORCO) 7.5-325 MG per tablet Take 1 tablet by mouth every 4 (four) hours as needed.      Marland Kitchen ibuprofen (ADVIL,MOTRIN) 600 MG tablet Take 800 mg by mouth every 8 (eight) hours as needed.      Marland Kitchen aminocaproic acid (AMICAR) 500 MG tablet Take 1 tablet (500 mg total) by mouth every 6 (six) hours.  10 tablet  1  . cyclobenzaprine (FLEXERIL) 10 MG tablet Take 10 mg by mouth 3 (three) times daily as needed.       No current facility-administered medications for this visit.     REVIEW OF SYSTEMS:   Constitutional: Denies fevers, chills or night sweats All other systems were reviewed with the patient and are negative.  PHYSICAL EXAMINATION: ECOG PERFORMANCE STATUS: 0 - Asymptomatic  Filed Vitals:   10/30/13 1501  BP: 127/73  Pulse: 84  Temp: 97.9 F (36.6 C)  Resp: 18   Filed Weights   10/30/13 1501  Weight: 183 lb 8 oz (83.235 kg)    GENERAL:alert, no distress and comfortable OROPHARYNX:no exudate, no erythema and lips, buccal mucosa, and tongue normal . Noted persistent bleeding from dental extraction sites. No clots are seen NEURO: alert & oriented x 3 with fluent speech, no focal motor/sensory  deficits  LABORATORY DATA:  I have reviewed the data as listed Results for orders placed in visit on 10/30/13 (from the past 48 hour(s))  CBC WITH DIFFERENTIAL     Status: Abnormal   Collection Time    10/30/13  2:49 PM      Result Value Range   WBC 9.6  3.9 - 10.3 10e3/uL   NEUT# 7.1 (*) 1.5 - 6.5 10e3/uL   HGB 12.7  11.6 - 15.9 g/dL   HCT 16.1  09.6 - 04.5 %   Platelets 83 (*) 145 - 400 10e3/uL   MCV 80.0  79.5 - 101.0 fL   MCH 26.4  25.1 - 34.0 pg   MCHC 33.0  31.5 - 36.0 g/dL   RBC 4.09  8.11 - 9.14 10e6/uL   RDW 14.8 (*) 11.2 - 14.5 %   lymph# 1.5  0.9 - 3.3 10e3/uL   MONO# 0.9  0.1 - 0.9 10e3/uL   Eosinophils Absolute 0.0  0.0 - 0.5 10e3/uL   Basophils Absolute 0.0  0.0 - 0.1 10e3/uL   NEUT% 74.6  38.4 - 76.8 %   LYMPH% 15.5  14.0 - 49.7 %   MONO% 9.1  0.0 - 14.0 %   EOS% 0.4  0.0 - 7.0 %   BASO% 0.4  0.0 - 2.0 %   nRBC 0  0 - 0 %    Lab Results  Component Value Date   WBC 9.6 10/30/2013   HGB 12.7 10/30/2013   HCT 38.5 10/30/2013  MCV 80.0 10/30/2013   PLT 83* 10/30/2013    ASSESSMENT & PLAN:  #1 chronic ITP Platelet count is stable. We will monitor closely. #2 active bleeding status post dental extraction I do recommend the patient Amicar. I gave her some gauze to bite on I spoke with our pharmacist. It appears that we can get it at cost price here at [facility] long outpatient pharmacy. I rewrite her prescription. I recommend she start taking it every 4 hours for total 10 tablets. I recommend the patient to call tomorrow to see how she's doing. I recommend she take Percocet for pain in several ibuprofen due to risk of bleeding. All questions were answered. The patient knows to call the clinic with any problems, questions or concerns. No barriers to learning was detected.  I spent 15 minutes counseling the patient face to face. The total time spent in the appointment was 20 minutes and more than 50% was on counseling.     Terrace Fontanilla, MD 10/30/2013 3:20 PM

## 2013-10-30 NOTE — Telephone Encounter (Signed)
Pt unable to afford co-pay for Amicar and did not get filled.  Samples given from our pharmacy.  Called Husband to come back and pick up Twelve Amicar 500 mg tablets.  Instructions stapled to package to take one tablet every 6 hrs by mouth until gone.  Husband came back and picked up medication.  He verbalized understanding of instructions.

## 2013-11-28 ENCOUNTER — Telehealth: Payer: Self-pay | Admitting: Hematology and Oncology

## 2013-11-28 ENCOUNTER — Other Ambulatory Visit (HOSPITAL_BASED_OUTPATIENT_CLINIC_OR_DEPARTMENT_OTHER): Payer: BC Managed Care – PPO

## 2013-11-28 ENCOUNTER — Ambulatory Visit (HOSPITAL_BASED_OUTPATIENT_CLINIC_OR_DEPARTMENT_OTHER): Payer: BC Managed Care – PPO | Admitting: Hematology and Oncology

## 2013-11-28 VITALS — BP 114/62 | HR 107 | Temp 98.6°F | Resp 20 | Ht 64.0 in | Wt 188.8 lb

## 2013-11-28 DIAGNOSIS — R5383 Other fatigue: Secondary | ICD-10-CM

## 2013-11-28 DIAGNOSIS — D693 Immune thrombocytopenic purpura: Secondary | ICD-10-CM

## 2013-11-28 DIAGNOSIS — R5381 Other malaise: Secondary | ICD-10-CM

## 2013-11-28 DIAGNOSIS — N920 Excessive and frequent menstruation with regular cycle: Secondary | ICD-10-CM

## 2013-11-28 LAB — CBC WITH DIFFERENTIAL/PLATELET
BASO%: 0.5 % (ref 0.0–2.0)
BASOS ABS: 0.1 10*3/uL (ref 0.0–0.1)
EOS%: 4.1 % (ref 0.0–7.0)
Eosinophils Absolute: 0.4 10*3/uL (ref 0.0–0.5)
HEMATOCRIT: 38.3 % (ref 34.8–46.6)
HEMOGLOBIN: 12.4 g/dL (ref 11.6–15.9)
LYMPH%: 24.9 % (ref 14.0–49.7)
MCH: 25.9 pg (ref 25.1–34.0)
MCHC: 32.4 g/dL (ref 31.5–36.0)
MCV: 80.1 fL (ref 79.5–101.0)
MONO#: 0.8 10*3/uL (ref 0.1–0.9)
MONO%: 8.2 % (ref 0.0–14.0)
NEUT#: 6.3 10*3/uL (ref 1.5–6.5)
NEUT%: 62.3 % (ref 38.4–76.8)
Platelets: 129 10*3/uL — ABNORMAL LOW (ref 145–400)
RBC: 4.78 10*6/uL (ref 3.70–5.45)
RDW: 15 % — ABNORMAL HIGH (ref 11.2–14.5)
WBC: 10 10*3/uL (ref 3.9–10.3)
lymph#: 2.5 10*3/uL (ref 0.9–3.3)
nRBC: 0 % (ref 0–0)

## 2013-11-28 NOTE — Telephone Encounter (Signed)
s.w. pt and advised on Jan 2016 appt....pt ok and aware °

## 2013-11-28 NOTE — Progress Notes (Signed)
[facility] OFFICE PROGRESS NOTE  Astrid DivineGRIFFIN,[name], MD DIAGNOSIS:  Chronic ITP  SUMMARY OF HEMATOLOGIC HISTORY: This is a very pleasant 33 year old lady with history of chronic ITP. 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 She responded well to prednisone but due to the excessive weight gain and other side effects, prefer not to be treated with prednisone. When she was treated with rituximab before 2010, she did develop significant allergic reaction. Her last treatment with IVIG was in 2011. Currently she's been placed on observation with blood work done several times a month INTERVAL HISTORY: Brandy CraftsFamilia J Balsam 33 y.o. female returns for further followup. Recently she developed upper respiratory tract infection with cough, nasal drainage and congestion. She had mild hemoptysis last week, resolved. With her last menstrual cycle, she has excessive bleeding but this has subsequently stopped. She complained of persistent fatigue.  I have reviewed the past medical history, past surgical history, social history and family history with the patient and they are unchanged from previous note.  ALLERGIES:  is allergic to rituximab.  MEDICATIONS:  Current Outpatient Prescriptions  Medication Sig Dispense Refill  . cyclobenzaprine (FLEXERIL) 10 MG tablet Take 10 mg by mouth 3 (three) times daily as needed.       No current facility-administered medications for this visit.     REVIEW OF SYSTEMS:   Constitutional: Denies fevers, chills or night sweats Eyes: Denies blurriness of vision Cardiovascular: Denies palpitation, chest discomfort or lower extremity swelling Gastrointestinal:  Denies nausea, heartburn or change in bowel habits Skin: Denies abnormal skin rashes Lymphatics: Denies new lymphadenopathy or easy bruising Neurological:Denies numbness, tingling or new weaknesses Behavioral/Psych: Mood is stable, no new changes  All  other systems were reviewed with the patient and are negative.  PHYSICAL EXAMINATION: ECOG PERFORMANCE STATUS: 0 - Asymptomatic  Filed Vitals:   11/28/13 1217  BP: 114/62  Pulse: 107  Temp: 98.6 F (37 C)  Resp: 20   Filed Weights   11/28/13 1217  Weight: 188 lb 12.8 oz (85.639 kg)    GENERAL:alert, no distress and comfortable SKIN: skin color, texture, turgor are normal, no rashes or significant lesions EYES: normal, Conjunctiva are pink and non-injected, sclera clear OROPHARYNX:no exudate, no erythema and lips, buccal mucosa, and tongue normal  NECK: supple, thyroid normal size, non-tender, without nodularity LYMPH:  no palpable lymphadenopathy in the cervical, axillary or inguinal LUNGS: clear to auscultation and percussion with normal breathing effort HEART: regular rate & rhythm and no murmurs and no lower extremity edema ABDOMEN:abdomen soft, non-tender and normal bowel sounds Musculoskeletal:no cyanosis of digits and no clubbing  NEURO: alert & oriented x 3 with fluent speech, no focal motor/sensory deficits  LABORATORY DATA:  I have reviewed the data as listed Results for orders placed in visit on 11/28/13 (from the past 48 hour(s))  CBC WITH DIFFERENTIAL     Status: Abnormal   Collection Time    11/28/13 11:57 AM      Result Value Range   WBC 10.0  3.9 - 10.3 10e3/uL   NEUT# 6.3  1.5 - 6.5 10e3/uL   HGB 12.4  11.6 - 15.9 g/dL   HCT 40.938.3  81.134.8 - 91.446.6 %   Platelets 129 (*) 145 - 400 10e3/uL   MCV 80.1  79.5 - 101.0 fL   MCH 25.9  25.1 - 34.0 pg   MCHC 32.4  31.5 - 36.0 g/dL   RBC 7.824.78  9.563.70 - 2.135.45 10e6/uL  RDW 15.0 (*) 11.2 - 14.5 %   lymph# 2.5  0.9 - 3.3 10e3/uL   MONO# 0.8  0.1 - 0.9 10e3/uL   Eosinophils Absolute 0.4  0.0 - 0.5 10e3/uL   Basophils Absolute 0.1  0.0 - 0.1 10e3/uL   NEUT% 62.3  38.4 - 76.8 %   LYMPH% 24.9  14.0 - 49.7 %   MONO% 8.2  0.0 - 14.0 %   EOS% 4.1  0.0 - 7.0 %   BASO% 0.5  0.0 - 2.0 %   nRBC 0  0 - 0 %    Lab Results   Component Value Date   WBC 10.0 11/28/2013   HGB 12.4 11/28/2013   HCT 38.3 11/28/2013   MCV 80.1 11/28/2013   PLT 129* 11/28/2013    ASSESSMENT & PLAN:  #1 chronic ITP Platelet count is stable. We will monitor closely. #2 significant menorrhagia Ideally, the patient should be placed on birth control for menorrhagia. However the patient herself desire to be pregnant. Her husband somewhat disagreed. If the patient has successful pregnancy in the future, she would need to see me back for further management of ITP as it is likely she would need to be placed on treatment. Pregnancy typically will cause her platelet count to drop  All questions were answered. The patient knows to call the clinic with any problems, questions or concerns. No barriers to learning was detected.  I spent 15 minutes counseling the patient face to face. The total time spent in the appointment was 20 minutes and more than 50% was on counseling.     [facility], Jaydence Arnesen, MD 11/28/2013 12:41 PM

## 2014-06-15 ENCOUNTER — Other Ambulatory Visit (HOSPITAL_COMMUNITY)
Admission: RE | Admit: 2014-06-15 | Discharge: 2014-06-15 | Disposition: A | Discharge status: Home / Self Care | Payer: 59 | Source: Ambulatory Visit | Attending: Obstetrics and Gynecology | Admitting: Obstetrics and Gynecology

## 2014-06-15 ENCOUNTER — Other Ambulatory Visit: Payer: Self-pay | Admitting: Obstetrics and Gynecology

## 2014-06-15 DIAGNOSIS — Z01419 Encounter for gynecological examination (general) (routine) without abnormal findings: Secondary | ICD-10-CM | POA: Insufficient documentation

## 2014-06-16 LAB — CYTOLOGY - PAP

## 2014-11-17 ENCOUNTER — Telehealth: Payer: Self-pay | Admitting: Hematology and Oncology

## 2014-11-17 NOTE — Telephone Encounter (Signed)
due to call day moved 12/02/14 appt to AM - s/w pt and per pt moved appt to 12/08/14 @ 2:45pm for lb/NG. pt cannot come in the AM. pt has new d/t.

## 2014-12-02 ENCOUNTER — Ambulatory Visit: Payer: BC Managed Care – PPO | Admitting: Hematology and Oncology

## 2014-12-02 ENCOUNTER — Other Ambulatory Visit: Payer: BC Managed Care – PPO

## 2014-12-08 ENCOUNTER — Other Ambulatory Visit (HOSPITAL_BASED_OUTPATIENT_CLINIC_OR_DEPARTMENT_OTHER): Payer: BLUE CROSS/BLUE SHIELD

## 2014-12-08 ENCOUNTER — Ambulatory Visit (HOSPITAL_BASED_OUTPATIENT_CLINIC_OR_DEPARTMENT_OTHER): Payer: BLUE CROSS/BLUE SHIELD | Admitting: Hematology and Oncology

## 2014-12-08 ENCOUNTER — Encounter: Payer: Self-pay | Admitting: Hematology and Oncology

## 2014-12-08 ENCOUNTER — Other Ambulatory Visit: Payer: Self-pay | Admitting: Hematology and Oncology

## 2014-12-08 VITALS — BP 118/65 | HR 81 | Temp 98.1°F | Resp 20 | Ht 64.0 in | Wt 189.7 lb

## 2014-12-08 DIAGNOSIS — D693 Immune thrombocytopenic purpura: Secondary | ICD-10-CM

## 2014-12-08 DIAGNOSIS — D509 Iron deficiency anemia, unspecified: Secondary | ICD-10-CM

## 2014-12-08 LAB — CBC WITH DIFFERENTIAL/PLATELET
BASO%: 0.8 % (ref 0.0–2.0)
Basophils Absolute: 0.1 10*3/uL (ref 0.0–0.1)
EOS ABS: 0.2 10*3/uL (ref 0.0–0.5)
EOS%: 1.7 % (ref 0.0–7.0)
HCT: 37.8 % (ref 34.8–46.6)
HEMOGLOBIN: 11.5 g/dL — AB (ref 11.6–15.9)
LYMPH#: 2.9 10*3/uL (ref 0.9–3.3)
LYMPH%: 32.6 % (ref 14.0–49.7)
MCH: 24.3 pg — ABNORMAL LOW (ref 25.1–34.0)
MCHC: 30.5 g/dL — ABNORMAL LOW (ref 31.5–36.0)
MCV: 79.9 fL (ref 79.5–101.0)
MONO#: 1.2 10*3/uL — ABNORMAL HIGH (ref 0.1–0.9)
MONO%: 12.9 % (ref 0.0–14.0)
NEUT#: 4.6 10*3/uL (ref 1.5–6.5)
NEUT%: 52 % (ref 38.4–76.8)
Platelets: 115 10*3/uL — ABNORMAL LOW (ref 145–400)
RBC: 4.73 10*6/uL (ref 3.70–5.45)
RDW: 15.9 % — AB (ref 11.2–14.5)
WBC: 8.9 10*3/uL (ref 3.9–10.3)

## 2014-12-08 NOTE — Progress Notes (Signed)
[facility] OFFICE PROGRESS NOTE  Brandy DivineGRIFFIN,Brandy COLLINS, MD SUMMARY OF HEMATOLOGIC HISTORY: This is a very pleasant 34 year old lady with history of chronic ITP. 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 She responded well to prednisone but due to the excessive weight gain and other side effects, prefer not to be treated with prednisone. When she was treated with rituximab before 2010, she did develop significant allergic reaction. Her last treatment with IVIG was in 2011. Currently she's been placed on observation INTERVAL HISTORY: [name] 34 y.o. female returns for follow-up. The patient denies any recent signs or symptoms of bleeding such as spontaneous epistaxis, hematuria or hematochezia.  I have reviewed the past medical history, past surgical history, social history and family history with the patient and they are unchanged from previous note.  ALLERGIES:  is allergic to rituximab.  MEDICATIONS:  Current Outpatient Prescriptions  Medication Sig Dispense Refill  . ibuprofen (ADVIL,MOTRIN) 800 MG tablet Take 800 mg by mouth daily as needed.    . methocarbamol (ROBAXIN) 500 MG tablet Take 500 mg by mouth as needed for muscle spasms.     No current facility-administered medications for this visit.     REVIEW OF SYSTEMS:   Constitutional: Denies fevers, chills or night sweats Eyes: Denies blurriness of vision Ears, nose, mouth, throat, and face: Denies mucositis or sore throat Respiratory: Denies cough, dyspnea or wheezes Cardiovascular: Denies palpitation, chest discomfort or lower extremity swelling Gastrointestinal:  Denies nausea, heartburn or change in bowel habits Skin: Denies abnormal skin rashes Lymphatics: Denies new lymphadenopathy or easy bruising Neurological:Denies numbness, tingling or new weaknesses Behavioral/Psych: Mood is stable, no new changes  All other systems were reviewed with the patient  and are negative.  PHYSICAL EXAMINATION: ECOG PERFORMANCE STATUS: 0 - Asymptomatic  Filed Vitals:   12/08/14 1527  BP: 118/65  Pulse: 81  Temp: 98.1 F (36.7 C)  Resp: 20   Filed Weights   12/08/14 1527  Weight: 189 lb 11.2 oz (86.047 kg)    GENERAL:alert, no distress and comfortable SKIN: skin color, texture, turgor are normal, no rashes or significant lesions EYES: normal, Conjunctiva are pink and non-injected, sclera clear Musculoskeletal:no cyanosis of digits and no clubbing  NEURO: alert & oriented x 3 with fluent speech, no focal motor/sensory deficits  LABORATORY DATA:  I have reviewed the data as listed Results for orders placed or performed in visit on 12/08/14 (from the past 48 hour(s))  CBC with Differential     Status: Abnormal   Collection Time: 12/08/14  3:13 PM  Result Value Ref Range   WBC 8.9 3.9 - 10.3 10e3/uL   NEUT# 4.6 1.5 - 6.5 10e3/uL   HGB 11.5 (L) 11.6 - 15.9 g/dL   HCT 40.937.8 81.134.8 - 91.446.6 %   Platelets 115 (L) 145 - 400 10e3/uL   MCV 79.9 79.5 - 101.0 fL   MCH 24.3 (L) 25.1 - 34.0 pg   MCHC 30.5 (L) 31.5 - 36.0 g/dL   RBC 7.824.73 9.563.70 - 2.135.45 10e6/uL   RDW 15.9 (H) 11.2 - 14.5 %   lymph# 2.9 0.9 - 3.3 10e3/uL   MONO# 1.2 (H) 0.1 - 0.9 10e3/uL   Eosinophils Absolute 0.2 0.0 - 0.5 10e3/uL   Basophils Absolute 0.1 0.0 - 0.1 10e3/uL   NEUT% 52.0 38.4 - 76.8 %   LYMPH% 32.6 14.0 - 49.7 %   MONO% 12.9 0.0 - 14.0 %   EOS% 1.7 0.0 -  7.0 %   BASO% 0.8 0.0 - 2.0 %    Lab Results  Component Value Date   WBC 8.9 12/08/2014   HGB 11.5* 12/08/2014   HCT 37.8 12/08/2014   MCV 79.9 12/08/2014   PLT 115* 12/08/2014   ASSESSMENT & PLAN:  ITP (idiopathic thrombocytopenic purpura) Clinically, she has stable platelet count. As long as her platelet count is above 50,000, She Does Not Need Treatment. I Recommend PCP Follow-Up Only with Routine CBC Once a Year. I Will See Her Back in the Future If Needed.  Iron deficiency anemia I recommend oral prenatal  vitamin daily   All questions were answered. The patient knows to call the clinic with any problems, questions or concerns. No barriers to learning was detected.  I spent 15 minutes counseling the patient face to face. The total time spent in the appointment was 20 minutes and more than 50% was on counseling.     [facility], Keyarra Rendall, MD 12/08/2014 3:42 PM

## 2014-12-08 NOTE — Assessment & Plan Note (Signed)
Clinically, she has stable platelet count. As long as her platelet count is above 50,000, She Does Not Need Treatment. I Recommend PCP Follow-Up Only with Routine CBC Once a Year. I Will See Her Back in the Future If Needed.

## 2014-12-08 NOTE — Assessment & Plan Note (Signed)
I recommend oral prenatal vitamin daily

## 2015-06-21 ENCOUNTER — Other Ambulatory Visit (HOSPITAL_COMMUNITY)
Admission: RE | Admit: 2015-06-21 | Discharge: 2015-06-21 | Disposition: A | Discharge status: Home / Self Care | Payer: BLUE CROSS/BLUE SHIELD | Source: Ambulatory Visit | Attending: Obstetrics and Gynecology | Admitting: Obstetrics and Gynecology

## 2015-06-21 ENCOUNTER — Other Ambulatory Visit: Payer: Self-pay | Admitting: Obstetrics and Gynecology

## 2015-06-21 DIAGNOSIS — Z01419 Encounter for gynecological examination (general) (routine) without abnormal findings: Secondary | ICD-10-CM | POA: Diagnosis not present

## 2015-06-24 LAB — CYTOLOGY - PAP

## 2016-01-26 ENCOUNTER — Telehealth: Payer: Self-pay | Admitting: *Deleted

## 2016-01-26 ENCOUNTER — Encounter (HOSPITAL_COMMUNITY): Payer: Self-pay | Admitting: Emergency Medicine

## 2016-01-26 ENCOUNTER — Inpatient Hospital Stay (HOSPITAL_COMMUNITY)
Admission: EM | Admit: 2016-01-26 | Discharge: 2016-01-28 | DRG: 813 | Disposition: A | Discharge status: Home / Self Care | Payer: BLUE CROSS/BLUE SHIELD | Attending: Internal Medicine | Admitting: Internal Medicine

## 2016-01-26 ENCOUNTER — Emergency Department (HOSPITAL_COMMUNITY): Payer: BLUE CROSS/BLUE SHIELD

## 2016-01-26 DIAGNOSIS — D61818 Other pancytopenia: Secondary | ICD-10-CM | POA: Diagnosis present

## 2016-01-26 DIAGNOSIS — Z9081 Acquired absence of spleen: Secondary | ICD-10-CM

## 2016-01-26 DIAGNOSIS — J069 Acute upper respiratory infection, unspecified: Secondary | ICD-10-CM | POA: Diagnosis present

## 2016-01-26 DIAGNOSIS — D696 Thrombocytopenia, unspecified: Secondary | ICD-10-CM | POA: Insufficient documentation

## 2016-01-26 DIAGNOSIS — D72829 Elevated white blood cell count, unspecified: Secondary | ICD-10-CM | POA: Diagnosis not present

## 2016-01-26 DIAGNOSIS — K921 Melena: Secondary | ICD-10-CM | POA: Diagnosis not present

## 2016-01-26 DIAGNOSIS — R509 Fever, unspecified: Secondary | ICD-10-CM

## 2016-01-26 DIAGNOSIS — L709 Acne, unspecified: Secondary | ICD-10-CM | POA: Diagnosis not present

## 2016-01-26 DIAGNOSIS — D693 Immune thrombocytopenic purpura: Secondary | ICD-10-CM | POA: Diagnosis present

## 2016-01-26 DIAGNOSIS — R3129 Other microscopic hematuria: Secondary | ICD-10-CM | POA: Diagnosis present

## 2016-01-26 LAB — INFLUENZA PANEL BY PCR (TYPE A & B)
H1N1FLUPCR: NOT DETECTED
Influenza A By PCR: NEGATIVE
Influenza B By PCR: NEGATIVE

## 2016-01-26 LAB — CBC WITH DIFFERENTIAL/PLATELET
BASOS ABS: 0.1 10*3/uL (ref 0.0–0.1)
BASOS PCT: 1 %
Eosinophils Absolute: 0.1 10*3/uL (ref 0.0–0.7)
Eosinophils Relative: 2 %
HEMATOCRIT: 38.8 % (ref 36.0–46.0)
HEMOGLOBIN: 12.7 g/dL (ref 12.0–15.0)
Lymphocytes Relative: 27 %
Lymphs Abs: 1.5 10*3/uL (ref 0.7–4.0)
MCH: 25.6 pg — ABNORMAL LOW (ref 26.0–34.0)
MCHC: 32.7 g/dL (ref 30.0–36.0)
MCV: 78.1 fL (ref 78.0–100.0)
MONOS PCT: 21 %
Monocytes Absolute: 1.2 10*3/uL — ABNORMAL HIGH (ref 0.1–1.0)
NEUTROS ABS: 2.8 10*3/uL (ref 1.7–7.7)
NEUTROS PCT: 49 %
Platelets: 12 10*3/uL — CL (ref 150–400)
RBC: 4.97 MIL/uL (ref 3.87–5.11)
RDW: 15.1 % (ref 11.5–15.5)
WBC: 5.6 10*3/uL (ref 4.0–10.5)

## 2016-01-26 LAB — COMPREHENSIVE METABOLIC PANEL
ALBUMIN: 4 g/dL (ref 3.5–5.0)
ALK PHOS: 82 U/L (ref 38–126)
ALT: 11 U/L — AB (ref 14–54)
AST: 20 U/L (ref 15–41)
Anion gap: 10 (ref 5–15)
BILIRUBIN TOTAL: 0.4 mg/dL (ref 0.3–1.2)
BUN: 9 mg/dL (ref 6–20)
CALCIUM: 9.1 mg/dL (ref 8.9–10.3)
CO2: 29 mmol/L (ref 22–32)
Chloride: 101 mmol/L (ref 101–111)
Creatinine, Ser: 0.9 mg/dL (ref 0.44–1.00)
GFR calc Af Amer: >60 mL/min (ref >60)
GFR calc non Af Amer: >60 mL/min (ref >60)
GLUCOSE: 97 mg/dL (ref 65–99)
Potassium: 3.7 mmol/L (ref 3.5–5.1)
Sodium: 140 mmol/L (ref 135–145)
TOTAL PROTEIN: 8.2 g/dL — AB (ref 6.5–8.1)

## 2016-01-26 LAB — URINALYSIS, ROUTINE W REFLEX MICROSCOPIC
Bilirubin Urine: NEGATIVE
GLUCOSE, UA: NEGATIVE mg/dL
Ketones, ur: NEGATIVE mg/dL
LEUKOCYTES UA: NEGATIVE
NITRITE: NEGATIVE
PH: 8 (ref 5.0–8.0)
PROTEIN: 30 mg/dL — AB
Specific Gravity, Urine: 1.01 (ref 1.005–1.030)

## 2016-01-26 LAB — URINE MICROSCOPIC-ADD ON

## 2016-01-26 LAB — HCG, SERUM, QUALITATIVE: Preg, Serum: NEGATIVE

## 2016-01-26 MED ORDER — CYCLOBENZAPRINE HCL 10 MG PO TABS: 10.0000 mg | ORAL_TABLET | Freq: Three times a day (TID) | ORAL | Status: DC | PRN

## 2016-01-26 MED ORDER — FLUTICASONE PROPIONATE 50 MCG/ACT NA SUSP
2.0000 | Freq: Every day | NASAL | Status: DC
  Administered 2016-01-26: 2 via NASAL
  Filled 2016-01-26: qty 16

## 2016-01-26 MED ORDER — DEXAMETHASONE 4 MG PO TABS
40.0000 mg | ORAL_TABLET | Freq: Every day | ORAL | Status: DC
  Administered 2016-01-26 – 2016-01-28 (×3): 40 mg via ORAL
  Filled 2016-01-26 (×3): qty 10

## 2016-01-26 MED ORDER — ACETAMINOPHEN 325 MG PO TABS
650.0000 mg | ORAL_TABLET | Freq: Four times a day (QID) | ORAL | Status: DC | PRN
  Administered 2016-01-27 (×2): 650 mg via ORAL
  Filled 2016-01-26 (×2): qty 2

## 2016-01-26 MED ORDER — PRENATAL MULTIVITAMIN CH
1.0000 | ORAL_TABLET | Freq: Every day | ORAL | Status: DC
  Administered 2016-01-27: 1 via ORAL
  Filled 2016-01-26 (×2): qty 1

## 2016-01-26 MED ORDER — IMMUNE GLOBULIN (HUMAN) 10 GM/100ML IV SOLN
1.0000 g/kg | INTRAVENOUS | Status: AC
  Administered 2016-01-26 – 2016-01-27 (×2): 85 g via INTRAVENOUS
  Filled 2016-01-26: qty 800
  Filled 2016-01-26: qty 850

## 2016-01-26 MED ORDER — ACETAMINOPHEN 650 MG RE SUPP: 650.0000 mg | Freq: Four times a day (QID) | RECTAL | Status: DC | PRN

## 2016-01-26 MED ORDER — ONDANSETRON HCL 4 MG/2ML IJ SOLN
4.0000 mg | Freq: Four times a day (QID) | INTRAMUSCULAR | Status: DC | PRN
  Administered 2016-01-27: 4 mg via INTRAVENOUS
  Filled 2016-01-26: qty 2

## 2016-01-26 MED ORDER — ONDANSETRON HCL 4 MG PO TABS: 4.0000 mg | ORAL_TABLET | Freq: Four times a day (QID) | ORAL | Status: DC | PRN

## 2016-01-26 NOTE — ED Notes (Signed)
Pt from PCP, sent to ED for low platelets level, 8000, reports Hx ITP. presents with petechia in nick area. Denies abd pain yet reports cold symptoms , nasal congestion. denies cough. Alert and oriented x 4.

## 2016-01-26 NOTE — ED Provider Notes (Signed)
CSN: [id number]     Arrival date & time 01/26/16  1311 History   First MD Initiated Contact with Patient 01/26/16 1350     Chief Complaint  Patient presents with  . low platelets      (Consider location/radiation/quality/duration/timing/severity/associated sxs/prior Treatment) HPI  35 year old female with history of ITP who presents with thrombocytopenia. One week ago with generalized weakness, and cold like symptoms, cough, congestion. Last night noticed petechia over chest and neck and legs. No melena, hematochezia, bleeding gums, or other bleeding. No fevers or chill, just with congestion now. No N/V/D or urinary complaints. Last menses earlier this month longer than usual. No trauma.   Past Medical History  Diagnosis Date  . ITP (idiopathic thrombocytopenic purpura)   . Need for prophylactic vaccination and inoculation against influenza 08/27/2013  . Fatigue 08/27/2013   Past Surgical History  Procedure Laterality Date  . Splenetomy  2006   Family History  Problem Relation Age of Onset  . Irritable bowel syndrome Mother   . Dementia Maternal Grandmother    Social History  Substance Use Topics  . Smoking status: Never Smoker   . Smokeless tobacco: None  . Alcohol Use: None   OB History    No data available     Review of Systems 10/14 systems reviewed and are negative other than those stated in the HPI    Allergies  Rituximab  Home Medications   Prior to Admission medications   Medication Sig Start Date End Date Taking? Authorizing Provider  acetaminophen (TYLENOL) 325 MG tablet Take 325-650 mg by mouth every 6 (six) hours as needed. pain   Yes Historical Provider, MD  cyclobenzaprine (FLEXERIL) 10 MG tablet Take 10 mg by mouth 3 (three) times daily as needed. Menstrual cramping.   Yes Historical Provider, MD  fluocinonide (LIDEX) 0.05 % external solution Apply 1 application topically 2 (two) times daily. 12/29/15  Yes Historical Provider, MD  ibuprofen  (ADVIL,MOTRIN) 800 MG tablet Take 800 mg by mouth daily as needed for headache or cramping (only uses during her mentrual cycle.).    Yes Historical Provider, MD  loratadine-pseudoephedrine (CLARITIN-D 24-HOUR) 10-240 MG 24 hr tablet Take 1 tablet by mouth daily as needed for allergies.   Yes Historical Provider, MD  Prenatal Vit-Fe Fumarate-FA (PRENATAL MULTIVITAMIN) TABS tablet Take 1 tablet by mouth daily at 12 noon.   Yes Historical Provider, MD  triamcinolone cream (KENALOG) 0.1 % Apply 1 application topically daily as needed. Apply to umbilical area as needed for rash. 12/29/15  Yes Historical Provider, MD  fluticasone (FLONASE) 50 MCG/ACT nasal spray Place 2 sprays into both nostrils daily. 01/26/16   Historical Provider, MD   BP 118/82 mmHg  Pulse 106  Temp(Src) 98.1 F (36.7 C) (Oral)  Resp 15  Ht 5' 4.25" (1.632 m)  Wt 189 lb (85.73 kg)  BMI 32.19 kg/m2  SpO2 100%  LMP 01/09/2016 Physical Exam Physical Exam  Nursing note and vitals reviewed. Constitutional: Well developed, well nourished, non-toxic, and in no acute distress Head: Normocephalic and atraumatic.  Mouth/Throat: Oropharynx is clear and moist.  Neck: Normal range of motion. Neck supple.  Cardiovascular: Normal rate and regular rhythm.   Pulmonary/Chest: Effort normal and breath sounds normal.  Abdominal: Soft. There is no tenderness. There is no rebound and no guarding.  Musculoskeletal: Normal range of motion.  Neurological: Alert, no facial droop, fluent speech, moves all extremities symmetrically Skin: Skin is warm and dry. Scattered petechiae over neck and chest wall.  Psychiatric: Cooperative  ED Course  Procedures (including critical care time) Labs Review Labs Reviewed  CBC WITH DIFFERENTIAL/PLATELET - Abnormal; Notable for the following:    MCH 25.6 (*)    Platelets 12 (*)    Monocytes Absolute 1.2 (*)    All other components within normal limits  COMPREHENSIVE METABOLIC PANEL - Abnormal; Notable for  the following:    Total Protein 8.2 (*)    ALT 11 (*)    All other components within normal limits  HCG, SERUM, QUALITATIVE  INFLUENZA PANEL BY PCR (TYPE A & B, H1N1)  URINALYSIS, ROUTINE W REFLEX MICROSCOPIC (NOT AT [facility])    Imaging Review Dg Chest 2 View  01/26/2016  CLINICAL DATA:  Low platelet count. History of idiopathic thrombocytopenic purpura. No chest complaints. EXAM: CHEST  2 VIEW COMPARISON:  08/10/2008 FINDINGS: The heart size and mediastinal contours are within normal limits. Both lungs are clear. No pleural effusion or pneumothorax. The visualized skeletal structures are unremarkable. IMPRESSION: Normal chest radiographs. Electronically Signed   By: Amie Portland M.D.   On: 01/26/2016 16:21   I have personally reviewed and evaluated these images and lab results as part of my medical decision-making.   EKG Interpretation None      MDM   Final diagnoses:  Thrombocytopenia (HCC)  Acute ITP (HCC)     35 year old female who presents with recurrent acute ITP. No active bleeding. Well appearing with stable vital signs. Platelets of 12. No other major metabolic or electrolyte derangements. Discussed with Dr. Bertis Ruddy from hematology. She will plan for IVIG and high dose decadron tonight. Discussed with Dr. Arbutus Leas and admitted to hospitalist service for ongoing management.  Lavera Guise, MD 01/26/16 [id number]

## 2016-01-26 NOTE — H&P (Signed)
History and Physical  [name]:[id number] DOB: 1981/04/07 DOA: 01/26/2016   PCP: Astrid Divine, MD  Referring Physician: ED/ Dr. Crista Curb   Chief Complaint: petechiae, thrombocytopenia  HPI:  35 y/o female with hx of ITP presented with one-day history of petechiae on her chest, neck, and lower extremities. The patient stated that she has had a 3 day history of upper respiratory symptoms which included a nonproductive cough, rhinorrhea, and nasal congestion. Apparently, the patient's husband had a similar upper respiratory illness one week prior to her admission. In addition, the patient noted she had some mild epistaxis and rectal bleeding approximately 2-3 days prior to this admission. In addition, the patient also noted a longer than usual menstrual cycle. She denies any new medications or any recent surgeries. She denies any recent blood or platelet transfusions. She went to see her primary care provider on 01/26/2016, and CBC showed platelets of 8000 in office. As a result, the patient was sent to the emergency department.   In the emergency department, the patient was afebrile and hemodynamically stable. Platelets were noted to be 12,000 with hemoglobin 12.7. BMP and hepatic panel were unremarkable. Urine pregnancy test was negative.  Assessment/Plan: ITP exacerbation -Likely precipitated by the patient's recent upper respiratory like infection  -Hematology, Dr. Bertis Ruddy has been consulted -dexamethasone and IVIG per Dr. Bertis Ruddy -monitor for bleeding and Hgb -Patient had splenectomy 2006. Developed allergic reaction to Rituxan 2010. Last received IVIG 2011 URI, influenza-like illness  -Influenza PCR has been ordered  -Place patient on droplet isolation until results return  -serum B12 -HIV  Hematochezia -Had one episode 2 days prior to admission -Related to the patient's pancytopenia/ITP exacerbation -Hemoglobin stable -monitor       Past Medical  History  Diagnosis Date  . ITP (idiopathic thrombocytopenic purpura)   . Need for prophylactic vaccination and inoculation against influenza 08/27/2013  . Fatigue 08/27/2013   Past Surgical History  Procedure Laterality Date  . Splenetomy  2006   Social History:  reports that she has never smoked. She does not have any smokeless tobacco history on file. Her alcohol and drug histories are not on file.   Family History  Problem Relation Age of Onset  . Irritable bowel syndrome Mother   . Dementia Maternal Grandmother      Allergies  Allergen Reactions  . Rituximab Swelling      Prior to Admission medications   Medication Sig Start Date End Date Taking? Authorizing Provider  acetaminophen (TYLENOL) 325 MG tablet Take 325-650 mg by mouth every 6 (six) hours as needed. pain   Yes Historical Provider, MD  cyclobenzaprine (FLEXERIL) 10 MG tablet Take 10 mg by mouth 3 (three) times daily as needed. Menstrual cramping.   Yes Historical Provider, MD  fluocinonide (LIDEX) 0.05 % external solution Apply 1 application topically 2 (two) times daily. 12/29/15  Yes Historical Provider, MD  ibuprofen (ADVIL,MOTRIN) 800 MG tablet Take 800 mg by mouth daily as needed for headache or cramping (only uses during her mentrual cycle.).    Yes Historical Provider, MD  loratadine-pseudoephedrine (CLARITIN-D 24-HOUR) 10-240 MG 24 hr tablet Take 1 tablet by mouth daily as needed for allergies.   Yes Historical Provider, MD  Prenatal Vit-Fe Fumarate-FA (PRENATAL MULTIVITAMIN) TABS tablet Take 1 tablet by mouth daily at 12 noon.   Yes Historical Provider, MD  triamcinolone cream (KENALOG) 0.1 % Apply 1 application topically daily as needed. Apply to umbilical area as needed for rash.  12/29/15  Yes Historical Provider, MD  fluticasone (FLONASE) 50 MCG/ACT nasal spray Place 2 sprays into both nostrils daily. 01/26/16   Historical Provider, MD    Review of Systems:  Constitutional:  No weight loss, night sweats,  Fevers, chills Head&Eyes: No headache.  No vision loss.  No eye pain or scotoma ENT:  No Difficulty swallowing,Tooth/dental problems,Sore throat,  No ear ache, post nasal drip,  Cardio-vascular:  No chest pain, Orthopnea, PND, swelling in lower extremities,  dizziness, palpitations  GI:  No  abdominal pain, nausea, vomiting, diarrhea, loss of appetite, hematochezia, melena, heartburn, indigestion, Resp:  No shortness of breath with exertion or at rest. No coughing up of blood .No wheezing.No chest wall deformity  Skin:  no rash or lesions.  GU:  no dysuria, change in color of urine, no urgency or frequency. No flank pain.  Musculoskeletal:  No joint pain or swelling. No decreased range of motion. No back pain.  Psych:  No change in mood or affect. No depression or anxiety. Neurologic: no dysesthesia, no focal weakness, no vision loss. No syncope  Physical Exam: Filed Vitals:   01/26/16 1320 01/26/16 1542 01/26/16 1542  BP: 117/76  118/82  Pulse: 102  106  Temp: 98.1 F (36.7 C)    TempSrc: Oral    Resp: 16  15  Height:  5' 4.25" (1.632 m)   Weight:  85.73 kg (189 lb)   SpO2: 99%  100%   General:  A&O x 3, NAD, nontoxic, pleasant/cooperative Head/Eye: No conjunctival hemorrhage, no icterus, /AT, No nystagmus ENT:  No icterus,  No thrush, good dentition, no pharyngeal exudate Neck:  No masses, no lymphadenpathy, no bruits CV:  RRR, no rub, no gallop, no S3 Lung:  CTAB, good air movement, no wheeze, no rhonchi Abdomen: soft/NT, +BS, nondistended, no peritoneal signs Ext: No cyanosis, No rashes, No petechiae, No lymphangitis, No edema Neuro: CNII-XII intact, strength 4/5 in bilateral upper and lower extremities, no dysmetria  Labs on Admission:  Basic Metabolic Panel:  Recent Labs Lab 01/26/16 1356  NA 140  K 3.7  CL 101  CO2 29  GLUCOSE 97  BUN 9  CREATININE 0.90  CALCIUM 9.1   Liver Function Tests:  Recent Labs Lab 01/26/16 1356  AST 20  ALT 11*    ALKPHOS 82  BILITOT 0.4  PROT 8.2*  ALBUMIN 4.0   No results for input(s): LIPASE, AMYLASE in the last 168 hours. No results for input(s): AMMONIA in the last 168 hours. CBC:  Recent Labs Lab 01/26/16 1356  WBC 5.6  NEUTROABS 2.8  HGB 12.7  HCT 38.8  MCV 78.1  PLT 12*   Cardiac Enzymes: No results for input(s): CKTOTAL, CKMB, CKMBINDEX, TROPONINI in the last 168 hours. BNP: Invalid input(s): POCBNP CBG: No results for input(s): GLUCAP in the last 168 hours.  Radiological Exams on Admission: No results found.     Time spent:50 minutes Code Status:   FULL Family Communication:   No Family at bedside   Keyron Pokorski, DO  [facility] Pager [phone]  If 7PM-7AM, please contact night-coverage [email or web address] Password [email or web address] 01/26/2016, 4:08 PM

## 2016-01-26 NOTE — Telephone Encounter (Signed)
Pt left VM states she is on her way to [facility] ED for bleeding with Platelet count of 8,000 per her Family Doctor.

## 2016-01-26 NOTE — Progress Notes (Signed)
Vital signs completed after initiation of IVIG,  Patient tolerating well. Rateincreased.

## 2016-01-26 NOTE — Progress Notes (Signed)
See consult note

## 2016-01-26 NOTE — ED Notes (Signed)
Informed Dr Verdie Mosher of pt's platelet count of 12

## 2016-01-26 NOTE — Consult Note (Signed)
[facility] CONSULT NOTE  Patient Care Team: Kelton Pillar, MD as PCP - General (Family Medicine) Heath Lark, MD as Consulting Physician (Hematology and Oncology)  CHIEF COMPLAINTS/PURPOSE OF CONSULTATION:  Recurrent, severe ITP  HISTORY OF PRESENTING ILLNESS:  [name] 35 y.o. female is here because of thrombocytopenia. This patient is well-known to me. 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 She responded well to prednisone but due to the excessive weight gain and other side effects, prefer not to be treated with prednisone. When she was treated with rituximab before 2010, she did develop significant allergic reaction. Her last treatment with IVIG was in 2011. Currently she's been placed on observation  Last week, she was undergoing a lot of stress. Starting Sunday,3 days ago, she did have flulike illness with low-grade fever, sensation of sore throat, nasal drainage and feeling fatigued. She noticed petechiae rash starting yesterday and mild blood when she blows her nose and rectal bleeding Around February 13th, 2017, she have heavy menstruation for 7 days. The patient denies history of liver disease, exposure to heparin, history of cardiac murmur/prior cardiovascular surgery or recent new medications She denies recent platelet transfusions   MEDICAL HISTORY:  Past Medical History  Diagnosis Date  . ITP (idiopathic thrombocytopenic purpura)   . Need for prophylactic vaccination and inoculation against influenza 08/27/2013  . Fatigue 08/27/2013    SURGICAL HISTORY: Past Surgical History  Procedure Laterality Date  . Splenetomy  2006    SOCIAL HISTORY: Social History   Social History  . Marital Status: Married    Spouse Name: N/A  . Number of Children: 0  . Years of Education: N/A   Occupational History  .      branch management, [employer]   Social History  Main Topics  . Smoking status: Never Smoker   . Smokeless tobacco: Not on file  . Alcohol Use: Not on file  . Drug Use: Not on file  . Sexual Activity: Not on file   Other Topics Concern  . Not on file   Social History Narrative    FAMILY HISTORY: Family History  Problem Relation Age of Onset  . Irritable bowel syndrome Mother   . Dementia Maternal Grandmother     ALLERGIES:  is allergic to rituximab.  MEDICATIONS:  Current Facility-Administered Medications  Medication Dose Route Frequency Provider Last Rate Last Dose  . dexamethasone (DECADRON) tablet 40 mg  40 mg Oral Daily Heath Lark, MD      . Immune Globulin 10% (OCTAGAM) IV infusion 1 g/kg  1 g/kg Intravenous Q24H Heath Lark, MD       Current Outpatient Prescriptions  Medication Sig Dispense Refill  . acetaminophen (TYLENOL) 325 MG tablet Take 325-650 mg by mouth every 6 (six) hours as needed. pain    . cyclobenzaprine (FLEXERIL) 10 MG tablet Take 10 mg by mouth 3 (three) times daily as needed. Menstrual cramping.    . fluocinonide (LIDEX) 0.05 % external solution Apply 1 application topically 2 (two) times daily.  2  . ibuprofen (ADVIL,MOTRIN) 800 MG tablet Take 800 mg by mouth daily as needed for headache or cramping (only uses during her mentrual cycle.).     Marland Kitchen loratadine-pseudoephedrine (CLARITIN-D 24-HOUR) 10-240 MG 24 hr tablet Take 1 tablet by mouth daily as needed for allergies.    . Prenatal Vit-Fe Fumarate-FA (PRENATAL MULTIVITAMIN) TABS tablet Take 1 tablet by mouth daily at  12 noon.    . triamcinolone cream (KENALOG) 0.1 % Apply 1 application topically daily as needed. Apply to umbilical area as needed for rash.  1  . fluticasone (FLONASE) 50 MCG/ACT nasal spray Place 2 sprays into both nostrils daily.      REVIEW OF SYSTEMS:   Constitutional: Denies chills or abnormal night sweats Eyes: Denies blurriness of vision, double vision or watery eyes Respiratory: Denies cough, dyspnea or wheezes Cardiovascular:  Denies palpitation, chest discomfort or lower extremity swelling Gastrointestinal:  Denies nausea, heartburn or change in bowel habits Lymphatics: Denies new lymphadenopathy or easy bruising Neurological:Denies numbness, tingling or new weaknesses Behavioral/Psych: Mood is stable, no new changes  All other systems were reviewed with the patient and are negative.  PHYSICAL EXAMINATION: ECOG PERFORMANCE STATUS: 1 - Symptomatic but completely ambulatory  Filed Vitals:   01/26/16 1320  BP: 117/76  Pulse: 102  Temp: 98.1 F (36.7 C)  Resp: 16   There were no vitals filed for this visit.  GENERAL:alert, no distress and comfortable SKIN: Noted petechiae rash on both legs  EYES: normal, conjunctiva are pink and non-injected, sclera clear OROPHARYNX:no exudate, no erythema and lips, buccal mucosa, and tongue normal  NECK: supple, thyroid normal size, non-tender, without nodularity LYMPH:  no palpable lymphadenopathy in the cervical, axillary or inguinal LUNGS: clear to auscultation and percussion with normal breathing effort HEART: regular rate & rhythm and no murmurs and no lower extremity edema ABDOMEN:abdomen soft, non-tender and normal bowel sounds Musculoskeletal:no cyanosis of digits and no clubbing  PSYCH: alert & oriented x 3 with fluent speech. Noted nasal congestion NEURO: no focal motor/sensory deficits  LABORATORY DATA:  I have reviewed the data as listed Recent Results (from the past 2160 hour(s))  CBC with Differential     Status: Abnormal   Collection Time: 01/26/16  1:56 PM  Result Value Ref Range   WBC 5.6 4.0 - 10.5 K/uL   RBC 4.97 3.87 - 5.11 MIL/uL   Hemoglobin 12.7 12.0 - 15.0 g/dL   HCT 38.8 36.0 - 46.0 %   MCV 78.1 78.0 - 100.0 fL   MCH 25.6 (L) 26.0 - 34.0 pg   MCHC 32.7 30.0 - 36.0 g/dL   RDW 15.1 11.5 - 15.5 %   Platelets 12 (LL) 150 - 400 K/uL    Comment: SPECIMEN CHECKED FOR CLOTS REPEATED TO VERIFY PLATELET COUNT CONFIRMED BY SMEAR CRITICAL  RESULT CALLED TO, READ BACK BY AND VERIFIED WITH: I.VEE [id number] A.QUIZON    Neutrophils Relative % 49 %   Neutro Abs 2.8 1.7 - 7.7 K/uL   Lymphocytes Relative 27 %   Lymphs Abs 1.5 0.7 - 4.0 K/uL   Monocytes Relative 21 %   Monocytes Absolute 1.2 (H) 0.1 - 1.0 K/uL   Eosinophils Relative 2 %   Eosinophils Absolute 0.1 0.0 - 0.7 K/uL   Basophils Relative 1 %   Basophils Absolute 0.1 0.0 - 0.1 K/uL  Comprehensive metabolic panel     Status: Abnormal   Collection Time: 01/26/16  1:56 PM  Result Value Ref Range   Sodium 140 135 - 145 mmol/L   Potassium 3.7 3.5 - 5.1 mmol/L   Chloride 101 101 - 111 mmol/L   CO2 29 22 - 32 mmol/L   Glucose, Bld 97 65 - 99 mg/dL   BUN 9 6 - 20 mg/dL   Creatinine, Ser 0.90 0.44 - 1.00 mg/dL   Calcium 9.1 8.9 - 10.3 mg/dL   Total Protein 8.2 (H)  6.5 - 8.1 g/dL   Albumin 4.0 3.5 - 5.0 g/dL   AST 20 15 - 41 U/L   ALT 11 (L) 14 - 54 U/L   Alkaline Phosphatase 82 38 - 126 U/L   Total Bilirubin 0.4 0.3 - 1.2 mg/dL   GFR calc non Af Amer >60 >60 mL/min   GFR calc Af Amer >60 >60 mL/min    Comment: (NOTE) The eGFR has been calculated using the CKD EPI equation. This calculation has not been validated in all clinical situations. eGFR's persistently <60 mL/min signify possible Chronic Kidney Disease.    Anion gap 10 5 - 15  hCG, serum, qualitative     Status: None   Collection Time: 01/26/16  2:23 PM  Result Value Ref Range   Preg, Serum NEGATIVE NEGATIVE    Comment:        THE SENSITIVITY OF THIS METHODOLOGY IS >10 mIU/mL.     ASSESSMENT & PLAN  Recurrent ITP This is likely exacerbated by recent upper respiratory tract infection I recommend testing for influenza The patient does not have active bleeding now. We can potentially hold platelet transfusion unless she bleeds I recommend IVIG 1 g/kg daily 2 days and high-dose dexamethasone 40 mg daily for 4 days I recommend checking CBC tomorrow morning along with serum vitamin B-12 and HIV plus  hepatitis C antibodies testing to complete workup I will see her tomorrow for further follow-up  Please call if questions arise

## 2016-01-26 NOTE — Progress Notes (Signed)
IVG in first.t-97.9,p-91 r-22 bp-10050, 100 % r/a

## 2016-01-26 NOTE — Telephone Encounter (Signed)
Patient called stating that last night she was coughing up blood. This morning, patient states that her nose was bleeding with petechiae in her mouth, chest, and legs. MD Bertis Ruddy notified and recommended patient to go to the [facility]. Patient informed and verbalized understanding.

## 2016-01-27 LAB — CBC WITH DIFFERENTIAL/PLATELET
BASOS ABS: 0 10*3/uL (ref 0.0–0.1)
BASOS PCT: 0 %
Eosinophils Absolute: 0 10*3/uL (ref 0.0–0.7)
Eosinophils Relative: 0 %
HCT: 39.4 % (ref 36.0–46.0)
HEMOGLOBIN: 12.7 g/dL (ref 12.0–15.0)
LYMPHS ABS: 0.4 10*3/uL — AB (ref 0.7–4.0)
Lymphocytes Relative: 6 %
MCH: 25.1 pg — ABNORMAL LOW (ref 26.0–34.0)
MCHC: 32.2 g/dL (ref 30.0–36.0)
MCV: 77.9 fL — ABNORMAL LOW (ref 78.0–100.0)
Monocytes Absolute: 0.2 10*3/uL (ref 0.1–1.0)
Monocytes Relative: 3 %
NEUTROS PCT: 91 %
Neutro Abs: 6.6 10*3/uL (ref 1.7–7.7)
Platelets: 13 10*3/uL — CL (ref 150–400)
RBC: 5.06 MIL/uL (ref 3.87–5.11)
RDW: 15.1 % (ref 11.5–15.5)
WBC: 7.2 10*3/uL (ref 4.0–10.5)

## 2016-01-27 LAB — HIV ANTIBODY (ROUTINE TESTING W REFLEX): HIV SCREEN 4TH GENERATION: NONREACTIVE

## 2016-01-27 LAB — VITAMIN B12: VITAMIN B 12: 753 pg/mL (ref 180–914)

## 2016-01-27 MED ORDER — DIPHENHYDRAMINE HCL 50 MG PO CAPS
50.0000 mg | ORAL_CAPSULE | Freq: Once | ORAL | Status: AC | PRN
  Administered 2016-01-27: 50 mg via ORAL
  Filled 2016-01-27: qty 1

## 2016-01-27 NOTE — Progress Notes (Signed)
[name]   DOB:[birth year]   ZO#:[id number]    Subjective: She complained of mild headache and fatigue. The patient denies any recent signs or symptoms of bleeding such as spontaneous epistaxis, hematuria or hematochezia.  Objective:  Filed Vitals:   01/26/16 2232 01/27/16 0533  BP: 100/50 130/76  Pulse: 91 105  Temp: 97.9 F (36.6 C) 98 F (36.7 C)  Resp: 22 18     Intake/Output Summary (Last 24 hours) at 01/27/16 0757 Last data filed at 01/27/16 9811  Gross per 24 hour  Intake    800 ml  Output      0 ml  Net    800 ml    GENERAL:alert, no distress and comfortable SKIN: mild petechiae in her lower extremities EYES: normal, Conjunctiva are pink and non-injected, sclera clear OROPHARYNX:no exudate, no erythema and lips, buccal mucosa, and tongue normal  NECK: supple, thyroid normal size, non-tender, without nodularity LYMPH:  no palpable lymphadenopathy in the cervical, axillary or inguinal LUNGS: clear to auscultation and percussion with normal breathing effort HEART: regular rate & rhythm and no murmurs and no lower extremity edema ABDOMEN:abdomen soft, non-tender and normal bowel sounds Musculoskeletal:no cyanosis of digits and no clubbing  NEURO: alert & oriented x 3 with fluent speech, no focal motor/sensory deficits   Labs:  Lab Results  Component Value Date   WBC 7.2 01/27/2016   HGB 12.7 01/27/2016   HCT 39.4 01/27/2016   MCV 77.9* 01/27/2016   PLT 13* 01/27/2016   NEUTROABS 6.6 01/27/2016    Lab Results  Component Value Date   NA 140 01/26/2016   K 3.7 01/26/2016   CL 101 01/26/2016   CO2 29 01/26/2016    Studies:  Dg Chest 2 View  01/26/2016  CLINICAL DATA:  Low platelet count. History of idiopathic thrombocytopenic purpura. No chest complaints. EXAM: CHEST  2 VIEW COMPARISON:  08/10/2008 FINDINGS: The heart size and mediastinal contours are within normal limits. Both lungs are clear. No pleural effusion or pneumothorax. The visualized skeletal  structures are unremarkable. IMPRESSION: Normal chest radiographs. Electronically Signed   By: Amie Portland M.D.   On: 01/26/2016 16:21    Assessment & Plan:   Recurrent ITP This is likely exacerbated by recent upper respiratory tract infection Testing for influenza, CXR and urinalysis are negative The patient does not have active bleeding now. We can potentially hold platelet transfusion unless she bleeds I recommend IVIG 1 g/kg daily 2 days and high-dose dexamethasone 40 mg daily for 4 days, started on 01/26/16 I recommend checking CBC tomorrow morning along with serum vitamin B-12 and HIV plus hepatitis C antibodies testing to complete workup, results pending  Discharge planning Not ready for discharge until platelet count improves at least to >30,000 if possible. Will follow  Frankey Botting, MD 01/27/2016  7:57 AM

## 2016-01-27 NOTE — Progress Notes (Signed)
Utilization review completed.  

## 2016-01-27 NOTE — Progress Notes (Signed)
PROGRESS NOTE  [name]:[id number] DOB: [birth year] DOA: 01/26/2016 PCP: Astrid Divine, MD Brief History 35 y/o female with hx of ITP presented with one-day history of petechiae on her chest, neck, and lower extremities. The patient stated that she has had a 3 day history of upper respiratory symptoms which included a nonproductive cough, rhinorrhea, and nasal congestion. Apparently, the patient's husband had a similar upper respiratory illness one week prior to her admission. In addition, the patient noted she had some mild epistaxis and rectal bleeding approximately 2-3 days prior to this admission. In addition, the patient also noted a longer than usual menstrual cycle. She denies any new medications or any recent surgeries. She denies any recent blood or platelet transfusions. She went to see her primary care provider on 01/26/2016, and CBC showed platelets of 8000 in office.   Assessment/Plan: ITP exacerbation -Likely precipitated by the patient's recent upper respiratory like infection  -Hematology, Dr. Bertis Ruddy has been consulted -dexamethasone and IVIG per Dr. Bertis Ruddy -monitor for bleeding and Hgb -Patient had splenectomy 2006. Developed allergic reaction to Rituxan 2010. Last received IVIG 2011 URI, influenza-like illness  -Influenza PCR--negative -Place patient on droplet isolation until results return  -serum B12 -HIV -Chest x-ray negative Hematochezia/microscopic hematuria -Had one episode 2 days prior to admission -Related to the patient's pancytopenia/ITP exacerbation -Hemoglobin stable -monitor   Family Communication:   Significant other updated at beside 01/27/16 Disposition Plan:   Home 01/28/16 or 01/29/16  if stable     Procedures/Studies: Dg Chest 2 View  01/26/2016  CLINICAL DATA:  Low platelet count. History of idiopathic thrombocytopenic purpura. No chest complaints. EXAM: CHEST  2 VIEW COMPARISON:  08/10/2008 FINDINGS: The heart size  and mediastinal contours are within normal limits. Both lungs are clear. No pleural effusion or pneumothorax. The visualized skeletal structures are unremarkable. IMPRESSION: Normal chest radiographs. Electronically Signed   By: Amie Portland M.D.   On: 01/26/2016 16:21         Subjective: Patient complains of abdomen headache. Denies any fevers, chills, chest discomfort, shortness breath, nausea, vomiting, diarrhea, abdominal pain. No dysuria or hematuria.  Objective: Filed Vitals:   01/26/16 2035 01/26/16 2136 01/26/16 2232 01/27/16 0533  BP: 110/50 117/68 100/50 130/76  Pulse: 99 94 91 105  Temp: 98 F (36.7 C) 97.5 F (36.4 C) 97.9 F (36.6 C) 98 F (36.7 C)  TempSrc: Oral Oral Oral Oral  Resp: Height:      Weight:      SpO2: 99% 98% 100% 100%    Intake/Output Summary (Last 24 hours) at 01/27/16 0853 Last data filed at 01/27/16 0643  Gross per 24 hour  Intake    800 ml  Output      0 ml  Net    800 ml   Weight change:  Exam:   General:  Pt is alert, follows commands appropriately, not in acute distress  HEENT: No icterus, No thrush, No neck mass, [place]/AT  Cardiovascular: RRR, S1/S2, no rubs, no gallops  Respiratory: CTA bilaterally, no wheezing, no crackles, no rhonchi  Abdomen: Soft/+BS, non tender, non distended, no guarding  Extremities: No edema, No lymphangitis, No petechiae, No rashes, no synovitis  Data Reviewed: Basic Metabolic Panel:  Recent Labs Lab 01/26/16 1356  NA 140  K 3.7  CL 101  CO2 29  GLUCOSE 97  BUN 9  CREATININE 0.90  CALCIUM 9.1   Liver  Function Tests:  Recent Labs Lab 01/26/16 1356  AST 20  ALT 11*  ALKPHOS 82  BILITOT 0.4  PROT 8.2*  ALBUMIN 4.0   No results for input(s): LIPASE, AMYLASE in the last 168 hours. No results for input(s): AMMONIA in the last 168 hours. CBC:  Recent Labs Lab 01/26/16 1356 01/27/16 0347  WBC 5.6 7.2  NEUTROABS 2.8 6.6  HGB 12.7 12.7  HCT 38.8 39.4  MCV 78.1  77.9*  PLT 12* 13*   Cardiac Enzymes: No results for input(s): CKTOTAL, CKMB, CKMBINDEX, TROPONINI in the last 168 hours. BNP: Invalid input(s): POCBNP CBG: No results for input(s): GLUCAP in the last 168 hours.  No results found for this or any previous visit (from the past 240 hour(s)).   Scheduled Meds: . dexamethasone  40 mg Oral Daily  . fluticasone  2 spray Each Nare Daily  . IMMUNE GLOBULIN 10% (HUMAN) IV - For Fluid Restriction Only  1 g/kg Intravenous Q24H  . prenatal multivitamin  1 tablet Oral Q1200   Continuous Infusions:    Chavonne Sforza, DO  [facility] Pager [phone]  If 7PM-7AM, please contact night-coverage [email or web address] Password [email or web address] 01/27/2016, 8:53 AM   LOS: 1 day

## 2016-01-28 ENCOUNTER — Telehealth: Payer: Self-pay | Admitting: Hematology and Oncology

## 2016-01-28 ENCOUNTER — Other Ambulatory Visit: Payer: Self-pay | Admitting: Hematology and Oncology

## 2016-01-28 DIAGNOSIS — D72829 Elevated white blood cell count, unspecified: Secondary | ICD-10-CM

## 2016-01-28 DIAGNOSIS — D693 Immune thrombocytopenic purpura: Secondary | ICD-10-CM

## 2016-01-28 DIAGNOSIS — L709 Acne, unspecified: Secondary | ICD-10-CM

## 2016-01-28 LAB — CBC WITH DIFFERENTIAL/PLATELET
BASOS PCT: 0 %
Basophils Absolute: 0 10*3/uL (ref 0.0–0.1)
EOS ABS: 0 10*3/uL (ref 0.0–0.7)
Eosinophils Relative: 0 %
HEMATOCRIT: 34.2 % — AB (ref 36.0–46.0)
Hemoglobin: 10.9 g/dL — ABNORMAL LOW (ref 12.0–15.0)
LYMPHS ABS: 1.4 10*3/uL (ref 0.7–4.0)
LYMPHS PCT: 4 %
MCH: 25.2 pg — AB (ref 26.0–34.0)
MCHC: 31.9 g/dL (ref 30.0–36.0)
MCV: 79 fL (ref 78.0–100.0)
MONOS PCT: 3 %
Monocytes Absolute: 1 10*3/uL (ref 0.1–1.0)
NEUTROS ABS: 32.1 10*3/uL — AB (ref 1.7–7.7)
Neutrophils Relative %: 93 %
Platelets: 149 10*3/uL — ABNORMAL LOW (ref 150–400)
RBC: 4.33 MIL/uL (ref 3.87–5.11)
RDW: 15.5 % (ref 11.5–15.5)
WBC: 34.5 10*3/uL — ABNORMAL HIGH (ref 4.0–10.5)

## 2016-01-28 NOTE — Progress Notes (Signed)
Brandy BeltFamilia J Voland   DOB:10-Oct-1981   QM#:[id number]    Subjective: She feels well. She has some break out of acne on her face from steroids.The patient denies any recent signs or symptoms of bleeding such as spontaneous epistaxis, hematuria or hematochezia.   Objective:  Filed Vitals:   01/27/16 2022 01/28/16 0534  BP: 127/70 117/62  Pulse: 103 94  Temp: 98.1 F (36.7 C) 98.5 F (36.9 C)  Resp: 18 18    No intake or output data in the 24 hours ending 01/28/16 0935  GENERAL:alert, no distress and comfortable SKIN: skin color, texture, turgor are normal, no rashes or significant lesions. Mild facial acne EYES: normal, Conjunctiva are pink and non-injected, sclera clear Musculoskeletal:no cyanosis of digits and no clubbing  NEURO: alert & oriented x 3 with fluent speech, no focal motor/sensory deficits   Labs:  Lab Results  Component Value Date   WBC 34.5* 01/28/2016   HGB 10.9* 01/28/2016   HCT 34.2* 01/28/2016   MCV 79.0 01/28/2016   PLT 149* 01/28/2016   NEUTROABS PENDING 01/28/2016    Lab Results  Component Value Date   NA 140 01/26/2016   K 3.7 01/26/2016   CL 101 01/26/2016   CO2 29 01/26/2016    Studies:  Dg Chest 2 View  01/26/2016  CLINICAL DATA:  Low platelet count. History of idiopathic thrombocytopenic purpura. No chest complaints. EXAM: CHEST  2 VIEW COMPARISON:  08/10/2008 FINDINGS: The heart size and mediastinal contours are within normal limits. Both lungs are clear. No pleural effusion or pneumothorax. The visualized skeletal structures are unremarkable. IMPRESSION: Normal chest radiographs. Electronically Signed   By: Amie Portlandavid  Ormond M.D.   On: 01/26/2016 16:21    Assessment & Plan:  Recurrent ITP This is likely exacerbated by recent upper respiratory tract infection Testing for influenza, CXR and urinalysis are negative The patient does not have active bleeding now. We can potentially hold platelet transfusion unless she bleeds She had received IVIG 1  g/kg daily 2 days and high-dose dexamethasone 40 mg daily started on 01/26/16 Serum vitamin B-12, HIV plus hepatitis C antibodies testing were negative Platelet counts have normalized. She can be discharged after the dose of dexamethasone. No further prescription is necessary  Leukocytosis Facial acne Due to steroids. Can stop after today's dose  Discharge planning She can go home today. I will set up appointment to see her back on 02/08/2016  [facility] Center LLCGORSUCH, Theodora Lalanne, MD 01/28/2016  9:35 AM

## 2016-01-28 NOTE — Telephone Encounter (Signed)
per pof to sch pt appt-gave pt copy of avs °

## 2016-01-28 NOTE — Care Management Note (Signed)
Case Management Note  Patient Details  Name: [name] MRN: [id number] Date of Birth: 1981/03/18  Subjective/Objective:                    Action/Plan:d/c home no needs or orders.   Expected Discharge Date:   (unknown)               Expected Discharge Plan:  Home/Self Care  In-House Referral:     Discharge planning Services  CM Consult  Post Acute Care Choice:    Choice offered to:     DME Arranged:    DME Agency:     HH Arranged:    HH Agency:     Status of Service:  Completed, signed off  Medicare Important Message Given:    Date Medicare IM Given:    Medicare IM give by:    Date Additional Medicare IM Given:    Additional Medicare Important Message give by:     If discussed at Long Length of Stay Meetings, dates discussed:    Additional Comments:  [name], Brandy Castleman, RN 01/28/2016, 12:43 PM

## 2016-01-28 NOTE — Discharge Summary (Signed)
Physician Discharge Summary  [name] ZOX:[id number] DOB: 08/12/1981 DOA: 01/26/2016  PCP: Astrid DivineGRIFFIN,[name], MD  Admit date: 01/26/2016 Discharge date: 01/28/2016  Recommendations for Outpatient Follow-up:  1. Pt will need to follow up with PCP in 2 weeks post discharge 2. Please obtain CBC in 1-2 weeks 3. Follow up with Dr. Bertis RuddyGorsuch on 02/08/16 Discharge Diagnoses:  ITP exacerbation -Likely precipitated by the patient's recent upper respiratory like infection  -Hematology, Dr. Bertis RuddyGorsuch has been consulted -dexamethasone and IVIG per Dr. Bertis RuddyGorsuch -monitor for bleeding and Hgb--no further active bleeding -Patient had splenectomy 2006. Developed allergic reaction to Rituxan 2010. Last received IVIG 2011 -platelets 149 on day of discharge URI, influenza-like illness  -Influenza PCR--negative -Place patient on droplet isolation until results return  -serum B12--753 -HIV--neg -Chest x-ray negative Hematochezia/microscopic hematuria -Had one episode 2 days prior to admission -Related to the patient's pancytopenia/ITP exacerbation -Hemoglobin stable -monitor  Discharge Condition: stable  Disposition: home  Diet:regular Wt Readings from Last 3 Encounters:  01/26/16 84.6 kg (186 lb 8.2 oz)  12/08/14 86.047 kg (189 lb 11.2 oz)  11/28/13 85.639 kg (188 lb 12.8 oz)    History of present illness:  35 y/o female with hx of ITP presented with one-day history of petechiae on her chest, neck, and lower extremities. The patient stated that she has had a 3 day history of upper respiratory symptoms which included a nonproductive cough, rhinorrhea, and nasal congestion. Apparently, the patient's husband had a similar upper respiratory illness one week prior to her admission. In addition, the patient noted she had some mild epistaxis and rectal bleeding approximately 2-3 days prior to this admission. In addition, the patient also noted a longer than usual menstrual cycle. She denies any  new medications or any recent surgeries. She denies any recent blood or platelet transfusions. She went to see her primary care provider on 01/26/2016, and CBC showed platelets of 8000 in office.   Discharge Exam: Filed Vitals:   01/27/16 2022 01/28/16 0534  BP: 127/70 117/62  Pulse: 103 94  Temp: 98.1 F (36.7 C) 98.5 F (36.9 C)  Resp: 18 18   Filed Vitals:   01/27/16 1725 01/27/16 1757 01/27/16 2022 01/28/16 0534  BP: 128/72 125/70 127/70 117/62  Pulse: 111 108 103 94  Temp:  98.9 F (37.2 C) 98.1 F (36.7 C) 98.5 F (36.9 C)  TempSrc:  Oral Oral Oral  Resp:   18 18  Height:      Weight:      SpO2:   100% 99%   General: A&O x 3, NAD, pleasant, cooperative Cardiovascular: RRR, no rub, no gallop, no S3 Respiratory: CTAB, no wheeze, no rhonchi Abdomen:soft, nontender, nondistended, positive bowel sounds Extremities: No edema, No lymphangitis, no petechiae  Discharge Instructions      Discharge Instructions    Diet - low sodium heart healthy    Complete by:  As directed      Increase activity slowly    Complete by:  As directed             Medication List    TAKE these medications        acetaminophen 325 MG tablet  Commonly known as:  TYLENOL  Take 325-650 mg by mouth every 6 (six) hours as needed. pain     cyclobenzaprine 10 MG tablet  Commonly known as:  FLEXERIL  Take 10 mg by mouth 3 (three) times daily as needed. Menstrual cramping.     fluocinonide 0.05 % external  solution  Commonly known as:  LIDEX  Apply 1 application topically 2 (two) times daily.     fluticasone 50 MCG/ACT nasal spray  Commonly known as:  FLONASE  Place 2 sprays into both nostrils daily.     ibuprofen 800 MG tablet  Commonly known as:  ADVIL,MOTRIN  Take 800 mg by mouth daily as needed for headache or cramping (only uses during her mentrual cycle.).     loratadine-pseudoephedrine 10-240 MG 24 hr tablet  Commonly known as:  CLARITIN-D 24-hour  Take 1 tablet by mouth  daily as needed for allergies.     prenatal multivitamin Tabs tablet  Take 1 tablet by mouth daily at 12 noon.     triamcinolone cream 0.1 %  Commonly known as:  KENALOG  Apply 1 application topically daily as needed. Apply to umbilical area as needed for rash.         The results of significant diagnostics from this hospitalization (including imaging, microbiology, ancillary and laboratory) are listed below for reference.    Significant Diagnostic Studies: Dg Chest 2 View  01/26/2016  CLINICAL DATA:  Low platelet count. History of idiopathic thrombocytopenic purpura. No chest complaints. EXAM: CHEST  2 VIEW COMPARISON:  08/10/2008 FINDINGS: The heart size and mediastinal contours are within normal limits. Both lungs are clear. No pleural effusion or pneumothorax. The visualized skeletal structures are unremarkable. IMPRESSION: Normal chest radiographs. Electronically Signed   By: Amie Portland M.D.   On: 01/26/2016 16:21     Microbiology: No results found for this or any previous visit (from the past 240 hour(s)).   Labs: Basic Metabolic Panel:  Recent Labs Lab 01/26/16 1356  NA 140  K 3.7  CL 101  CO2 29  GLUCOSE 97  BUN 9  CREATININE 0.90  CALCIUM 9.1   Liver Function Tests:  Recent Labs Lab 01/26/16 1356  AST 20  ALT 11*  ALKPHOS 82  BILITOT 0.4  PROT 8.2*  ALBUMIN 4.0   No results for input(s): LIPASE, AMYLASE in the last 168 hours. No results for input(s): AMMONIA in the last 168 hours. CBC:  Recent Labs Lab 01/26/16 1356 01/27/16 0347 01/28/16 0905  WBC 5.6 7.2 34.5*  NEUTROABS 2.8 6.6 32.1*  HGB 12.7 12.7 10.9*  HCT 38.8 39.4 34.2*  MCV 78.1 77.9* 79.0  PLT 12* 13* 149*   Cardiac Enzymes: No results for input(s): CKTOTAL, CKMB, CKMBINDEX, TROPONINI in the last 168 hours. BNP: Invalid input(s): POCBNP CBG: No results for input(s): GLUCAP in the last 168 hours.  Time coordinating discharge:  Greater than 30 minutes  Signed:  Nole Robey,  Yakelin Grenier, DO [facility] Pager: [phone] 01/28/2016, 10:33 AM

## 2016-01-29 LAB — HEPATITIS C ANTIBODY: HCV AB: 0.2 {s_co_ratio} (ref 0.0–0.9)

## 2016-02-08 ENCOUNTER — Encounter: Payer: Self-pay | Admitting: Hematology and Oncology

## 2016-02-08 ENCOUNTER — Other Ambulatory Visit: Payer: Self-pay | Admitting: Hematology and Oncology

## 2016-02-08 ENCOUNTER — Ambulatory Visit (HOSPITAL_BASED_OUTPATIENT_CLINIC_OR_DEPARTMENT_OTHER): Payer: BLUE CROSS/BLUE SHIELD | Admitting: Hematology and Oncology

## 2016-02-08 ENCOUNTER — Other Ambulatory Visit (HOSPITAL_BASED_OUTPATIENT_CLINIC_OR_DEPARTMENT_OTHER): Payer: BLUE CROSS/BLUE SHIELD

## 2016-02-08 VITALS — BP 121/74 | HR 124 | Temp 98.7°F | Resp 18 | Ht 64.0 in | Wt 193.6 lb

## 2016-02-08 DIAGNOSIS — D693 Immune thrombocytopenic purpura: Secondary | ICD-10-CM

## 2016-02-08 DIAGNOSIS — L409 Psoriasis, unspecified: Secondary | ICD-10-CM

## 2016-02-08 LAB — CBC WITH DIFFERENTIAL/PLATELET
BASO%: 0.6 % (ref 0.0–2.0)
Basophils Absolute: 0.1 10*3/uL (ref 0.0–0.1)
EOS ABS: 0 10*3/uL (ref 0.0–0.5)
EOS%: 0.2 % (ref 0.0–7.0)
HCT: 37 % (ref 34.8–46.6)
HEMOGLOBIN: 11.6 g/dL (ref 11.6–15.9)
LYMPH%: 18.8 % (ref 14.0–49.7)
MCH: 24.8 pg — ABNORMAL LOW (ref 25.1–34.0)
MCHC: 31.5 g/dL (ref 31.5–36.0)
MCV: 78.7 fL — ABNORMAL LOW (ref 79.5–101.0)
MONO#: 1.2 10*3/uL — ABNORMAL HIGH (ref 0.1–0.9)
MONO%: 11.8 % (ref 0.0–14.0)
NEUT%: 68.6 % (ref 38.4–76.8)
NEUTROS ABS: 6.8 10*3/uL — AB (ref 1.5–6.5)
Platelets: 629 10*3/uL — ABNORMAL HIGH (ref 145–400)
RBC: 4.7 10*6/uL (ref 3.70–5.45)
RDW: 16.2 % — AB (ref 11.2–14.5)
WBC: 10 10*3/uL (ref 3.9–10.3)
lymph#: 1.9 10*3/uL (ref 0.9–3.3)

## 2016-02-08 NOTE — Progress Notes (Signed)
[facility] OFFICE PROGRESS NOTE  Brandy DivineGRIFFIN,[name], MD SUMMARY OF HEMATOLOGIC HISTORY:  This is a very pleasant lady with history of chronic ITP. 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 She responded well to prednisone but due to the excessive weight gain and other side effects, prefer not to be treated with prednisone. When she was treated with rituximab before 2010, she did develop significant allergic reaction. Her last treatment with IVIG was in 2011. Currently she's been placed on observation She was admitted to the hospital from 01/26/2016 to 01/28/2016 with recurrence of ITP. She received IVIG and high-dose dexamethasone with improvement of her platelet count INTERVAL HISTORY: [name] 35 y.o. female returns for further follow-up. She had recovered from recent flulike illness. She also attributed significant stress as a cause of recent ITP because her husband and herself is attempting to get pregnant. She had recent flare of psoriasis, improved since recent steroid treatment  I have reviewed the past medical history, past surgical history, social history and family history with the patient and they are unchanged from previous note.  ALLERGIES:  is allergic to rituximab.  MEDICATIONS:  Current Outpatient Prescriptions  Medication Sig Dispense Refill  . acetaminophen (TYLENOL) 325 MG tablet Take 325-650 mg by mouth every 6 (six) hours as needed. pain    . cyclobenzaprine (FLEXERIL) 10 MG tablet Take 10 mg by mouth 3 (three) times daily as needed. Menstrual cramping.    . fluocinonide (LIDEX) 0.05 % external solution Apply 1 application topically 2 (two) times daily.  2  . fluticasone (FLONASE) 50 MCG/ACT nasal spray Place 2 sprays into both nostrils daily.    Marland Kitchen. ibuprofen (ADVIL,MOTRIN) 800 MG tablet Take 800 mg by mouth daily as needed for headache or cramping (only uses during her mentrual cycle.).     Marland Kitchen.  loratadine-pseudoephedrine (CLARITIN-D 24-HOUR) 10-240 MG 24 hr tablet Take 1 tablet by mouth daily as needed for allergies.    . Prenatal Vit-Fe Fumarate-FA (PRENATAL MULTIVITAMIN) TABS tablet Take 1 tablet by mouth daily at 12 noon.    . triamcinolone cream (KENALOG) 0.1 % Apply 1 application topically daily as needed. Apply to umbilical area as needed for rash.  1   No current facility-administered medications for this visit.     REVIEW OF SYSTEMS:   Constitutional: Denies fevers, chills or night sweats Eyes: Denies blurriness of vision Ears, nose, mouth, throat, and face: Denies mucositis or sore throat Respiratory: Denies cough, dyspnea or wheezes Cardiovascular: Denies palpitation, chest discomfort or lower extremity swelling Gastrointestinal:  Denies nausea, heartburn or change in bowel habits Skin: Denies abnormal skin rashes Lymphatics: Denies new lymphadenopathy or easy bruising Neurological:Denies numbness, tingling or new weaknesses Behavioral/Psych: Mood is stable, no new changes  All other systems were reviewed with the patient and are negative.  PHYSICAL EXAMINATION: ECOG PERFORMANCE STATUS: 0 - Asymptomatic  Filed Vitals:   02/08/16 0828  BP: 121/74  Pulse: 124  Temp: 98.7 F (37.1 C)  Resp: 18   Filed Weights   02/08/16 0828  Weight: 193 lb 9.6 oz (87.816 kg)    GENERAL:alert, no distress and comfortable SKIN: skin color, texture, turgor are normal, no rashes or significant lesions EYES: normal, Conjunctiva are pink and non-injected, sclera clear Musculoskeletal:no cyanosis of digits and no clubbing  NEURO: alert & oriented x 3 with fluent speech, no focal motor/sensory deficits  LABORATORY DATA:  I have reviewed the data as listed Results for orders  placed or performed in visit on 02/08/16 (from the past 48 hour(s))  CBC with Differential/Platelet     Status: Abnormal   Collection Time: 02/08/16  8:18 AM  Result Value Ref Range   WBC 10.0 3.9 - 10.3  10e3/uL   NEUT# 6.8 (H) 1.5 - 6.5 10e3/uL   HGB 11.6 11.6 - 15.9 g/dL   HCT 16.1 09.6 - 04.5 %   Platelets 629 (H) 145 - 400 10e3/uL   MCV 78.7 (L) 79.5 - 101.0 fL   MCH 24.8 (L) 25.1 - 34.0 pg   MCHC 31.5 31.5 - 36.0 g/dL   RBC 4.09 8.11 - 9.14 10e6/uL   RDW 16.2 (H) 11.2 - 14.5 %   lymph# 1.9 0.9 - 3.3 10e3/uL   MONO# 1.2 (H) 0.1 - 0.9 10e3/uL   Eosinophils Absolute 0.0 0.0 - 0.5 10e3/uL   Basophils Absolute 0.1 0.0 - 0.1 10e3/uL   NEUT% 68.6 38.4 - 76.8 %   LYMPH% 18.8 14.0 - 49.7 %   MONO% 11.8 0.0 - 14.0 %   EOS% 0.2 0.0 - 7.0 %   BASO% 0.6 0.0 - 2.0 %    Lab Results  Component Value Date   WBC 10.0 02/08/2016   HGB 11.6 02/08/2016   HCT 37.0 02/08/2016   MCV 78.7* 02/08/2016   PLT 629* 02/08/2016    ASSESSMENT & PLAN:  ITP (idiopathic thrombocytopenic purpura) The ITP has resolved. The patient is planning to get pregnant in the near future. If she does get pregnant, I will see her back for platelet count monitoring during pregnancy. The last time she needed treatment for ITP prior to recent admission was over 5 years. I do not feel it is beneficial for [place] to be monitoring her platelet count on the routine basis.  Scalp psoriasis She has intermittent flare of psoriasis on her scalp which could be related and is associated with ITP. She doesn't need further treatment right now. Continue conservative management with topical cream as needed   All questions were answered. The patient knows to call the clinic with any problems, questions or concerns. No barriers to learning was detected.  I spent 15 minutes counseling the patient face to face. The total time spent in the appointment was 20 minutes and more than 50% was on counseling.     [facility], Clarita Mcelvain, MD 3/14/20178:45 AM

## 2016-02-08 NOTE — Assessment & Plan Note (Signed)
She has intermittent flare of psoriasis on her scalp which could be related and is associated with ITP. She doesn't need further treatment right now. Continue conservative management with topical cream as needed 

## 2016-02-08 NOTE — Assessment & Plan Note (Signed)
The ITP has resolved. The patient is planning to get pregnant in the near future. If she does get pregnant, I will see her back for platelet count monitoring during pregnancy. The last time she needed treatment for ITP prior to recent admission was over 5 years. I do not feel it is beneficial for us to be monitoring her platelet count on the routine basis.

## 2016-02-08 NOTE — Progress Notes (Signed)
left on dr gorsuch's desk to sign

## 2016-02-08 NOTE — Progress Notes (Signed)
form recd via mail

## 2016-02-09 ENCOUNTER — Encounter: Payer: Self-pay | Admitting: Hematology and Oncology

## 2016-02-09 NOTE — Progress Notes (Signed)
pat wnts faxed  [phone]

## 2016-02-28 ENCOUNTER — Encounter: Payer: Self-pay | Admitting: Hematology and Oncology

## 2016-02-28 NOTE — Progress Notes (Signed)
[facility] fax done 02/09/16 sent to medical records

## 2016-04-18 DIAGNOSIS — F331 Major depressive disorder, recurrent, moderate: Secondary | ICD-10-CM | POA: Diagnosis not present

## 2016-04-25 DIAGNOSIS — F331 Major depressive disorder, recurrent, moderate: Secondary | ICD-10-CM | POA: Diagnosis not present

## 2016-05-11 DIAGNOSIS — F331 Major depressive disorder, recurrent, moderate: Secondary | ICD-10-CM | POA: Diagnosis not present

## 2016-05-18 DIAGNOSIS — F331 Major depressive disorder, recurrent, moderate: Secondary | ICD-10-CM | POA: Diagnosis not present

## 2016-06-01 DIAGNOSIS — F331 Major depressive disorder, recurrent, moderate: Secondary | ICD-10-CM | POA: Diagnosis not present

## 2016-08-21 ENCOUNTER — Other Ambulatory Visit: Payer: Self-pay | Admitting: Hematology and Oncology

## 2016-09-19 ENCOUNTER — Other Ambulatory Visit (HOSPITAL_COMMUNITY)
Admission: RE | Admit: 2016-09-19 | Discharge: 2016-09-19 | Disposition: A | Discharge status: Home / Self Care | Payer: BLUE CROSS/BLUE SHIELD | Source: Ambulatory Visit | Attending: Obstetrics and Gynecology | Admitting: Obstetrics and Gynecology

## 2016-09-19 ENCOUNTER — Other Ambulatory Visit: Payer: Self-pay | Admitting: Obstetrics and Gynecology

## 2016-09-19 DIAGNOSIS — Z01419 Encounter for gynecological examination (general) (routine) without abnormal findings: Secondary | ICD-10-CM | POA: Diagnosis not present

## 2016-09-19 DIAGNOSIS — Z1151 Encounter for screening for human papillomavirus (HPV): Secondary | ICD-10-CM | POA: Diagnosis not present

## 2016-09-20 DIAGNOSIS — L400 Psoriasis vulgaris: Secondary | ICD-10-CM | POA: Diagnosis not present

## 2016-09-22 LAB — CYTOLOGY - PAP
Diagnosis: NEGATIVE
HPV (WINDOPATH): NOT DETECTED

## 2016-10-31 DIAGNOSIS — J069 Acute upper respiratory infection, unspecified: Secondary | ICD-10-CM | POA: Diagnosis not present

## 2016-12-21 DIAGNOSIS — L400 Psoriasis vulgaris: Secondary | ICD-10-CM | POA: Diagnosis not present

## 2016-12-21 DIAGNOSIS — L700 Acne vulgaris: Secondary | ICD-10-CM | POA: Diagnosis not present

## 2017-01-22 DIAGNOSIS — R05 Cough: Secondary | ICD-10-CM | POA: Diagnosis not present

## 2017-03-20 ENCOUNTER — Encounter (HOSPITAL_COMMUNITY): Payer: Self-pay

## 2017-03-20 ENCOUNTER — Emergency Department (HOSPITAL_COMMUNITY): Payer: BLUE CROSS/BLUE SHIELD

## 2017-03-20 ENCOUNTER — Inpatient Hospital Stay (HOSPITAL_COMMUNITY)
Admission: EM | Admit: 2017-03-20 | Discharge: 2017-03-23 | DRG: 813 | Disposition: A | Discharge status: Home / Self Care | Payer: BLUE CROSS/BLUE SHIELD | Attending: Internal Medicine | Admitting: Internal Medicine

## 2017-03-20 DIAGNOSIS — D693 Immune thrombocytopenic purpura: Secondary | ICD-10-CM | POA: Diagnosis not present

## 2017-03-20 DIAGNOSIS — N939 Abnormal uterine and vaginal bleeding, unspecified: Secondary | ICD-10-CM | POA: Diagnosis present

## 2017-03-20 DIAGNOSIS — R531 Weakness: Secondary | ICD-10-CM | POA: Diagnosis not present

## 2017-03-20 DIAGNOSIS — G43909 Migraine, unspecified, not intractable, without status migrainosus: Secondary | ICD-10-CM | POA: Diagnosis present

## 2017-03-20 DIAGNOSIS — A4189 Other specified sepsis: Secondary | ICD-10-CM | POA: Diagnosis not present

## 2017-03-20 DIAGNOSIS — B379 Candidiasis, unspecified: Secondary | ICD-10-CM | POA: Diagnosis not present

## 2017-03-20 DIAGNOSIS — Z888 Allergy status to other drugs, medicaments and biological substances status: Secondary | ICD-10-CM | POA: Diagnosis not present

## 2017-03-20 DIAGNOSIS — L409 Psoriasis, unspecified: Secondary | ICD-10-CM | POA: Diagnosis not present

## 2017-03-20 DIAGNOSIS — A419 Sepsis, unspecified organism: Secondary | ICD-10-CM | POA: Diagnosis not present

## 2017-03-20 DIAGNOSIS — J111 Influenza due to unidentified influenza virus with other respiratory manifestations: Secondary | ICD-10-CM | POA: Diagnosis present

## 2017-03-20 DIAGNOSIS — R509 Fever, unspecified: Secondary | ICD-10-CM | POA: Diagnosis not present

## 2017-03-20 DIAGNOSIS — D509 Iron deficiency anemia, unspecified: Secondary | ICD-10-CM | POA: Diagnosis not present

## 2017-03-20 DIAGNOSIS — D72829 Elevated white blood cell count, unspecified: Secondary | ICD-10-CM | POA: Diagnosis not present

## 2017-03-20 LAB — CBC WITH DIFFERENTIAL/PLATELET
BASOS ABS: 0.1 10*3/uL (ref 0.0–0.1)
Basophils Relative: 1 %
EOS ABS: 0.1 10*3/uL (ref 0.0–0.7)
Eosinophils Relative: 1 %
HEMATOCRIT: 37.8 % (ref 36.0–46.0)
HEMOGLOBIN: 12.4 g/dL (ref 12.0–15.0)
LYMPHS ABS: 2.2 10*3/uL (ref 0.7–4.0)
Lymphocytes Relative: 25 %
MCH: 25.7 pg — ABNORMAL LOW (ref 26.0–34.0)
MCHC: 32.8 g/dL (ref 30.0–36.0)
MCV: 78.4 fL (ref 78.0–100.0)
Monocytes Absolute: 1.1 10*3/uL — ABNORMAL HIGH (ref 0.1–1.0)
Monocytes Relative: 12 %
Neutro Abs: 5.3 10*3/uL (ref 1.7–7.7)
Neutrophils Relative %: 61 %
Platelets: 16 10*3/uL — CL (ref 150–400)
RBC: 4.82 MIL/uL (ref 3.87–5.11)
RDW: 15.5 % (ref 11.5–15.5)
WBC: 8.8 10*3/uL (ref 4.0–10.5)

## 2017-03-20 LAB — COMPREHENSIVE METABOLIC PANEL
ALT: 11 U/L — AB (ref 14–54)
AST: 19 U/L (ref 15–41)
Albumin: 3.7 g/dL (ref 3.5–5.0)
Alkaline Phosphatase: 80 U/L (ref 38–126)
Anion gap: 6 (ref 5–15)
BILIRUBIN TOTAL: 0.7 mg/dL (ref 0.3–1.2)
BUN: 6 mg/dL (ref 6–20)
CALCIUM: 8.8 mg/dL — AB (ref 8.9–10.3)
CO2: 27 mmol/L (ref 22–32)
CREATININE: 0.83 mg/dL (ref 0.44–1.00)
Chloride: 103 mmol/L (ref 101–111)
Glucose, Bld: 89 mg/dL (ref 65–99)
Potassium: 3.7 mmol/L (ref 3.5–5.1)
Sodium: 136 mmol/L (ref 135–145)
TOTAL PROTEIN: 8 g/dL (ref 6.5–8.1)

## 2017-03-20 LAB — URINALYSIS, ROUTINE W REFLEX MICROSCOPIC
BILIRUBIN URINE: NEGATIVE
Glucose, UA: NEGATIVE mg/dL
Hgb urine dipstick: NEGATIVE
Ketones, ur: 20 mg/dL — AB
Leukocytes, UA: NEGATIVE
NITRITE: NEGATIVE
Protein, ur: NEGATIVE mg/dL
SPECIFIC GRAVITY, URINE: 1.018 (ref 1.005–1.030)
pH: 7 (ref 5.0–8.0)

## 2017-03-20 LAB — CBC
HCT: 34.7 % — ABNORMAL LOW (ref 36.0–46.0)
HEMOGLOBIN: 11.3 g/dL — AB (ref 12.0–15.0)
MCH: 25.5 pg — AB (ref 26.0–34.0)
MCHC: 32.6 g/dL (ref 30.0–36.0)
MCV: 78.3 fL (ref 78.0–100.0)
Platelets: 13 10*3/uL — CL (ref 150–400)
RBC: 4.43 MIL/uL (ref 3.87–5.11)
RDW: 15.6 % — ABNORMAL HIGH (ref 11.5–15.5)
WBC: 5.8 10*3/uL (ref 4.0–10.5)

## 2017-03-20 LAB — HCG, SERUM, QUALITATIVE: PREG SERUM: NEGATIVE

## 2017-03-20 LAB — TYPE AND SCREEN
ABO/RH(D): O POS
ANTIBODY SCREEN: NEGATIVE

## 2017-03-20 LAB — MRSA PCR SCREENING: MRSA BY PCR: NEGATIVE

## 2017-03-20 LAB — PROTIME-INR
INR: 1.06
PROTHROMBIN TIME: 13.8 s (ref 11.4–15.2)

## 2017-03-20 LAB — PROCALCITONIN: Procalcitonin: <0.1 ng/mL

## 2017-03-20 LAB — APTT: aPTT: 29 seconds (ref 24–36)

## 2017-03-20 LAB — LACTIC ACID, PLASMA: Lactic Acid, Venous: 0.7 mmol/L (ref 0.5–1.9)

## 2017-03-20 MED ORDER — ONDANSETRON HCL 4 MG PO TABS: 4.0000 mg | ORAL_TABLET | Freq: Four times a day (QID) | ORAL | Status: DC | PRN

## 2017-03-20 MED ORDER — ACETAMINOPHEN 325 MG PO TABS
650.0000 mg | ORAL_TABLET | Freq: Four times a day (QID) | ORAL | Status: DC | PRN
  Administered 2017-03-21 – 2017-03-23 (×3): 650 mg via ORAL
  Filled 2017-03-20 (×3): qty 2

## 2017-03-20 MED ORDER — OSELTAMIVIR PHOSPHATE 75 MG PO CAPS
75.0000 mg | ORAL_CAPSULE | Freq: Once | ORAL | Status: AC
  Administered 2017-03-20: 75 mg via ORAL
  Filled 2017-03-20: qty 1

## 2017-03-20 MED ORDER — VANCOMYCIN HCL IN DEXTROSE 1-5 GM/200ML-% IV SOLN
1000.0000 mg | Freq: Three times a day (TID) | INTRAVENOUS | Status: DC
  Administered 2017-03-21: 1000 mg via INTRAVENOUS
  Filled 2017-03-20: qty 200

## 2017-03-20 MED ORDER — VANCOMYCIN HCL IN DEXTROSE 1-5 GM/200ML-% IV SOLN
1000.0000 mg | INTRAVENOUS | Status: AC
  Administered 2017-03-20: 1000 mg via INTRAVENOUS
  Filled 2017-03-20: qty 200

## 2017-03-20 MED ORDER — ACETAMINOPHEN 650 MG RE SUPP: 650.0000 mg | Freq: Four times a day (QID) | RECTAL | Status: DC | PRN

## 2017-03-20 MED ORDER — SODIUM CHLORIDE 0.9% FLUSH
3.0000 mL | Freq: Two times a day (BID) | INTRAVENOUS | Status: DC
  Administered 2017-03-20 – 2017-03-21 (×2): 3 mL via INTRAVENOUS

## 2017-03-20 MED ORDER — ZOLPIDEM TARTRATE 5 MG PO TABS
5.0000 mg | ORAL_TABLET | Freq: Every evening | ORAL | Status: DC | PRN
  Administered 2017-03-21: 5 mg via ORAL
  Filled 2017-03-20: qty 1

## 2017-03-20 MED ORDER — SODIUM CHLORIDE 0.9 % IV SOLN
10.0000 mL/h | Freq: Once | INTRAVENOUS | Status: AC
  Administered 2017-03-20: 10 mL/h via INTRAVENOUS

## 2017-03-20 MED ORDER — OCTAGAM 10 GM/100ML IV SOLN
1.0000 g/kg | Freq: Once | INTRAVENOUS | Status: DC
  Filled 2017-03-20: qty 850

## 2017-03-20 MED ORDER — ACETAMINOPHEN 325 MG PO TABS
650.0000 mg | ORAL_TABLET | Freq: Once | ORAL | Status: AC
  Administered 2017-03-20: 650 mg via ORAL
  Filled 2017-03-20: qty 2

## 2017-03-20 MED ORDER — LORATADINE-PSEUDOEPHEDRINE ER 10-240 MG PO TB24: 1.0000 | ORAL_TABLET | Freq: Every day | ORAL | Status: DC | PRN

## 2017-03-20 MED ORDER — ONDANSETRON HCL 4 MG/2ML IJ SOLN
4.0000 mg | Freq: Four times a day (QID) | INTRAMUSCULAR | Status: DC | PRN
  Administered 2017-03-21: 4 mg via INTRAVENOUS
  Filled 2017-03-20 (×2): qty 2

## 2017-03-20 MED ORDER — DM-GUAIFENESIN ER 30-600 MG PO TB12
1.0000 | ORAL_TABLET | Freq: Two times a day (BID) | ORAL | Status: DC
  Administered 2017-03-20 – 2017-03-23 (×6): 1 via ORAL
  Filled 2017-03-20 (×6): qty 1

## 2017-03-20 MED ORDER — SODIUM CHLORIDE 0.9 % IV BOLUS (SEPSIS)
2500.0000 mL | Freq: Once | INTRAVENOUS | Status: AC
  Administered 2017-03-20: 1000 mL via INTRAVENOUS

## 2017-03-20 MED ORDER — SODIUM CHLORIDE 0.9 % IV SOLN
INTRAVENOUS | Status: DC
  Administered 2017-03-20 – 2017-03-23 (×5): via INTRAVENOUS

## 2017-03-20 MED ORDER — PIPERACILLIN-TAZOBACTAM 3.375 G IVPB 30 MIN
3.3750 g | INTRAVENOUS | Status: AC
  Administered 2017-03-20: 3.375 g via INTRAVENOUS
  Filled 2017-03-20: qty 50

## 2017-03-20 MED ORDER — PIPERACILLIN-TAZOBACTAM 3.375 G IVPB
3.3750 g | Freq: Three times a day (TID) | INTRAVENOUS | Status: DC
  Administered 2017-03-21 – 2017-03-22 (×4): 3.375 g via INTRAVENOUS
  Filled 2017-03-20 (×7): qty 50

## 2017-03-20 MED ORDER — PSEUDOEPHEDRINE HCL ER 120 MG PO TB12
120.0000 mg | ORAL_TABLET | Freq: Two times a day (BID) | ORAL | Status: DC | PRN
  Administered 2017-03-21 – 2017-03-23 (×3): 120 mg via ORAL
  Filled 2017-03-20 (×6): qty 1

## 2017-03-20 MED ORDER — LORATADINE 10 MG PO TABS: 10.0000 mg | ORAL_TABLET | Freq: Every day | ORAL | Status: DC | PRN

## 2017-03-20 MED ORDER — PREDNISONE 20 MG PO TABS
60.0000 mg | ORAL_TABLET | Freq: Once | ORAL | Status: AC
  Administered 2017-03-20: 60 mg via ORAL
  Filled 2017-03-20: qty 3

## 2017-03-20 MED ORDER — IMMUNE GLOBULIN (HUMAN) 10 GM/100ML IV SOLN
1.0000 g/kg | Freq: Once | INTRAVENOUS | Status: AC
  Administered 2017-03-20: 85 g via INTRAVENOUS
  Filled 2017-03-20: qty 800

## 2017-03-20 NOTE — ED Triage Notes (Signed)
Patient reports that her PCP called and instructed the patient to come to the ED for platelet count-25.

## 2017-03-20 NOTE — H&P (Signed)
History and Physical    [name]:[id number] DOB: 06/24/1981 DOA: 03/20/2017  Referring MD/NP/PA:   PCP: Astrid Divine, MD   Patient coming from:  The patient is coming from home.  At baseline, pt is independent for most of ADL. SNF  Assistant living facility   [facility]irement center.       Chief Complaint: Fever, vaginal bleeding, low platelet  HPI: [name] is a 36 y.o. female with medical history significant of ITP, s/p of splenomegaly, who presents with fever, vaginal bleeding and low platelet.  Pt states that she has history of ITP and was treated with IVIG March 2017. Today she states that she has fever and cough with yellow colored sputum production. Denies runny nose, sore throat, chest pain or SOB. She was seen by her PCP today and found to have platelet 25, was sent to ED for further evaluation and treatment. Patient states that she has been having vaginal bleeding in the past 3 days, initially was heavy, and now becomes lighter. No hematochezia, hematuria or hematemesis. Patient does not have confusion, nausea, vomiting, diarrhea, abdominal pain, symptoms of UTI or unilateral weakness. She has some mild bilateral hip pain.  ED Course: pt was found to have platelet is 16, hemoglobin 12.4, WBC 8.8, INR 1.06, PTT 29, negative pregnancy test, negative urinalysis, electrolytes renal function okay, temperature 100.6, tachycardia, oxygen saturation 99% on room air, negative chest x-ray. Patient is admitted to stepdown as inpatient. Hematologist, Dr. Bertis Ruddy was consulted by EDP.  Review of Systems:   General: has fevers, chills, no changes in body weight, has fatigue HEENT: no blurry vision, hearing changes or sore throat Respiratory: no dyspnea, has coughing, no wheezing CV: no chest pain, no palpitations GI: no nausea, vomiting, abdominal pain, diarrhea, constipation GU: no dysuria, burning on urination, increased urinary frequency, hematuria. Has vaginal  bleeding. Ext: no leg edema Neuro: no unilateral weakness, numbness, or tingling, no vision change or hearing loss Skin: no rash, no skin tear. MSK: No muscle spasm, no deformity, no limitation of range of movement in spin Heme: No easy bruising.  Travel history: No recent long distant travel.  Allergy:  Allergies  Allergen Reactions  . Rituximab Swelling    Past Medical History:  Diagnosis Date  . Fatigue 08/27/2013  . ITP (idiopathic thrombocytopenic purpura)   . Need for prophylactic vaccination and inoculation against influenza 08/27/2013    Past Surgical History:  Procedure Laterality Date  . splenetomy  2006    Social History:  reports that she has never smoked. She has never used smokeless tobacco. She reports that she drinks alcohol. She reports that she does not use drugs.  Family History:  Family History  Problem Relation Age of Onset  . Irritable bowel syndrome Mother   . Dementia Maternal Grandmother      Prior to Admission medications   Medication Sig Start Date End Date Taking? Authorizing Provider  ibuprofen (ADVIL,MOTRIN) 800 MG tablet Take 800 mg by mouth daily as needed for headache or cramping (only uses during her mentrual cycle.).    Yes Historical Provider, MD  loratadine-pseudoephedrine (CLARITIN-D 24-HOUR) 10-240 MG 24 hr tablet Take 1 tablet by mouth daily as needed for allergies.   Yes Historical Provider, MD  naproxen sodium (ANAPROX) 220 MG tablet Take 440 mg by mouth 2 (two) times daily with a meal.   Yes Historical Provider, MD    Physical Exam: Vitals:   03/20/17 1641 03/20/17 1652  BP: 116/81  Pulse: (!) 110   Resp: 16   Temp: (!) 100.6 F (38.1 C)   TempSrc: Oral   SpO2: 99%   Weight:  86.2 kg (190 lb)  Height:   (1.626 m)   General: Not in acute distress HEENT:       Eyes: PERRL, EOMI, no scleral icterus.       ENT: No discharge from the ears and nose, no pharynx injection, no tonsillar enlargement.        Neck: No JVD,  no bruit, no mass felt. Heme: No neck lymph node enlargement. Cardiac: S1/S2, RRR, No murmurs, No gallops or rubs. Respiratory: No rales, wheezing, rhonchi or rubs. GI: Soft, nondistended, nontender, no rebound pain, no organomegaly, BS present. GU: No hematuria Ext: No pitting leg edema bilaterally. 2+DP/PT pulse bilaterally. Musculoskeletal: No joint deformities, No joint redness or warmth, no limitation of ROM in spin. Skin: No rashes.  Neuro: Alert, oriented X3, cranial nerves II-XII grossly intact, moves all extremities normally.  Psych: Patient is not psychotic, no suicidal or hemocidal ideation.  Labs on Admission: I have personally reviewed following labs and imaging studies  CBC:  Recent Labs Lab 03/20/17 1830  WBC 8.8  NEUTROABS 5.3  HGB 12.4  HCT 37.8  MCV 78.4  PLT 16*   Basic Metabolic Panel:  Recent Labs Lab 03/20/17 1830  NA 136  K 3.7  CL 103  CO2 27  GLUCOSE 89  BUN 6  CREATININE 0.83  CALCIUM 8.8*   GFR: Estimated Creatinine Clearance: 101.5 mL/min (by C-G formula based on SCr of 0.83 mg/dL). Liver Function Tests:  Recent Labs Lab 03/20/17 1830  AST 19  ALT 11*  ALKPHOS 80  BILITOT 0.7  PROT 8.0  ALBUMIN 3.7   No results for input(s): LIPASE, AMYLASE in the last 168 hours. No results for input(s): AMMONIA in the last 168 hours. Coagulation Profile:  Recent Labs Lab 03/20/17 1830  INR 1.06   Cardiac Enzymes: No results for input(s): CKTOTAL, CKMB, CKMBINDEX, TROPONINI in the last 168 hours. BNP (last 3 results) No results for input(s): PROBNP in the last 8760 hours. HbA1C: No results for input(s): HGBA1C in the last 72 hours. CBG: No results for input(s): GLUCAP in the last 168 hours. Lipid Profile: No results for input(s): CHOL, HDL, LDLCALC, TRIG, CHOLHDL, LDLDIRECT in the last 72 hours. Thyroid Function Tests: No results for input(s): TSH, T4TOTAL, FREET4, T3FREE, THYROIDAB in the last 72 hours. Anemia Panel: No results  for input(s): VITAMINB12, FOLATE, FERRITIN, TIBC, IRON, RETICCTPCT in the last 72 hours. Urine analysis:    Component Value Date/Time   COLORURINE YELLOW 03/20/2017 1830   APPEARANCEUR CLEAR 03/20/2017 1830   LABSPEC 1.018 03/20/2017 1830   LABSPEC 1.020 08/23/2006 1632   PHURINE 7.0 03/20/2017 1830   GLUCOSEU NEGATIVE 03/20/2017 1830   HGBUR NEGATIVE 03/20/2017 1830   BILIRUBINUR NEGATIVE 03/20/2017 1830   BILIRUBINUR Negative 08/23/2006 1632   KETONESUR 20 (A) 03/20/2017 1830   PROTEINUR NEGATIVE 03/20/2017 1830   NITRITE NEGATIVE 03/20/2017 1830   LEUKOCYTESUR NEGATIVE 03/20/2017 1830   LEUKOCYTESUR Negative 08/23/2006 1632   Sepsis Labs: (procalcitonin:4,lacticidven:4) )No results found for this or any previous visit (from the past 240 hour(s)).   Radiological Exams on Admission: Dg Chest 2 View  Result Date: 03/20/2017 CLINICAL DATA:  Patient seen today at [facility] medicine. She states she was diagnosed with Flu and Low platelets. She is complaining of weakness and a headache EXAM: CHEST  2 VIEW COMPARISON:  01/22/2017 FINDINGS: Midline trachea. Normal heart size and mediastinal contours. No pleural effusion or pneumothorax. Clear lungs. IMPRESSION: No acute cardiopulmonary disease. Electronically Signed   By: Jeronimo Greaves M.D.   On: 03/20/2017 18:15     EKG:  Not done in ED, will get one.   Assessment/Plan Principal Problem:   Idiopathic thrombocytopenic purpura (HCC) Active Problems:   Vaginal bleeding   Sepsis (HCC)   Chronic ITP (idiopathic thrombocytopenic purpura) (HCC)   ITP exacerbation: Patient is 18 on admission. Has vaginal bleeding, hemoglobin 12.4, hemodynamically stable. Patient has some upper respiratory symptoms, indicating possible viral infection which may have triggered ITP exacerbation. Hematologist, Dr. Sharlette Dense was consulted by EDP, who  recommended to give prednisone 60 mg 1, 1 unit for platelets and start IVIG.   -will admit to  SDU as inpt -highly appreciate Dr. Consultation.  -Will follow up recommendations to give prednisone 60 mg 1, 1 unit for platelets and start IVIG   Sepsis: Patient meets criteria for sepsis with fever and tachypnea, lactate is pending. Hemodynamically stable. Likely due to upper respiratory viral infection. Since patient is s/p of splenomegaly and immunosuppressed-->will start antibiotics with broad coverage before the source of infection is identified. Chest x-ray negative urinalysis and negative now.  -will start IV vancomycin and Zosyn -Given one dose of Tamiflu, follow-up with flu PCR -Blood culture, urine culture, sputum culture -will get Procalcitonin and trend lactic acid levels per sepsis protocol. -IVF: 2.5L of NS bolus in ED, followed by 100 cc/h   Vaginal bleeding: Hemoglobin 12.4, platelets and 16. Hemodynamically stable currently. -transfuse 1 unit of platelets as above -cbc q6h, transfuse in needed (when Hgb<7.0)  DVT ppx: SCD Code Status: Full code Family Communication: Yes, patient's hasband at bed side Disposition Plan:  Anticipate discharge back to previous home environment Consults called: Hematologist, Dr.Gorsuch was consulted by EDP.  Admission status: SDU/inpation       Date of Service 03/20/2017    [name] [facility] Pager [phone]  If 7PM-7AM, please contact night-coverage [email or web address] Password [facility] 03/20/2017, 8:50 PM

## 2017-03-20 NOTE — ED Notes (Signed)
Bed: WA03 Expected date:  Expected time:  Means of arrival:  Comments: Triage 4  

## 2017-03-20 NOTE — ED Provider Notes (Signed)
[facility]-EMERGENCY DEPT Provider Note   CSN: [id number] Arrival date & time: 03/20/17  1603     History   Chief Complaint Chief Complaint  Patient presents with  . abnormal labs    HPI [name] is a 36 y.o. female.  HPI   Patient is a 36 year old female with past medical history significant for ITP presenting today for low platelets. Patient went to her primary care physician office today with feelings of fatigue and muscle aches and vaginal bleeding. Patient already had her period this month she was concerned about the rebleeding. Patient was tested for the flu,  came back positive. Patient got a perscription for Tamiflu but was called to the emergency department before taking her first dose.  Last time patient required she was ITP was 2017 in March.  Patient followed by Dr. Bertis Ruddy, hematology.   Past Medical History:  Diagnosis Date  . Fatigue 08/27/2013  . ITP (idiopathic thrombocytopenic purpura)   . Need for prophylactic vaccination and inoculation against influenza 08/27/2013    Patient Active Problem List   Diagnosis Date Noted  . Vaginal bleeding 03/20/2017  . Sepsis (HCC) 03/20/2017  . Chronic ITP (idiopathic thrombocytopenic purpura) (HCC) 03/20/2017  . 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 Hematochezia 01/26/2016  . Acute ITP (HCC)   . Thrombocytopenia (HCC)   . Need for prophylactic vaccination and inoculation against influenza 08/27/2013  . Iron deficiency anemia 08/27/2013  . Fatigue 08/27/2013  . Idiopathic thrombocytopenic purpura (HCC)     Past Surgical History:  Procedure Laterality Date  . splenetomy  2006    OB History    No data available       Home Medications    Prior to Admission medications   Medication Sig Start Date End Date Taking? Authorizing Provider  ibuprofen (ADVIL,MOTRIN) 800 MG tablet Take 800 mg by mouth daily as needed for headache or cramping (only uses during her mentrual cycle.).    Yes Historical Provider, MD    loratadine-pseudoephedrine (CLARITIN-D 24-HOUR) 10-240 MG 24 hr tablet Take 1 tablet by mouth daily as needed for allergies.   Yes Historical Provider, MD  naproxen sodium (ANAPROX) 220 MG tablet Take 440 mg by mouth 2 (two) times daily with a meal.   Yes Historical Provider, MD    Family History Family History  Problem Relation Age of Onset  . Irritable bowel syndrome Mother   . Dementia Maternal Grandmother     Social History Social History  Substance Use Topics  . Smoking status: Never Smoker  . Smokeless tobacco: Never Used  . Alcohol use Yes     Comment: occasionally     Allergies   Rituximab   Review of Systems Review of Systems  Constitutional: Positive for fatigue and fever. Negative for activity change.  HENT: Positive for congestion.   Respiratory: Positive for chest tightness. Negative for shortness of breath.   Cardiovascular: Negative for chest pain.  Gastrointestinal: Negative for abdominal pain.  Neurological: Positive for weakness.     Physical Exam Updated Vital Signs BP 111/70   Pulse 87   Temp 98.1 F (36.7 C) (Oral)   Resp (!) 27   Ht  (1.626 m)   Wt 194 lb 14.2 oz (88.4 kg)   LMP 03/18/2017   SpO2 96%   BMI 33.45 kg/m   Physical Exam  Constitutional: She is oriented to person, place, and time. She appears well-developed and well-nourished.  HENT:  Head: Normocephalic and atraumatic.  Eyes: Right eye exhibits  no discharge.  Cardiovascular: Regular rhythm and normal heart sounds.   No murmur heard. tachycardia  Pulmonary/Chest: Effort normal and breath sounds normal. She has no wheezes. She has no rales.  Abdominal: Soft. She exhibits no distension. There is no tenderness.  Neurological: She is oriented to person, place, and time.  Skin: Skin is warm and dry. She is not diaphoretic.  Psychiatric: She has a normal mood and affect.  Nursing note and vitals reviewed.    ED Treatments / Results  Labs (all labs ordered are  listed, but only abnormal results are displayed) Labs Reviewed  COMPREHENSIVE METABOLIC PANEL - Abnormal; Notable for the following:       Result Value   Calcium 8.8 (*)    ALT 11 (*)    All other components within normal limits  CBC WITH DIFFERENTIAL/PLATELET - Abnormal; Notable for the following:    MCH 25.7 (*)    Platelets 16 (*)    Monocytes Absolute 1.1 (*)    All other components within normal limits  URINALYSIS, ROUTINE W REFLEX MICROSCOPIC - Abnormal; Notable for the following:    Ketones, ur 20 (*)    All other components within normal limits  CBC - Abnormal; Notable for the following:    Hemoglobin 11.3 (*)    HCT 34.7 (*)    MCH 25.5 (*)    RDW 15.6 (*)    Platelets 13 (*)    All other components within normal limits  MRSA PCR SCREENING  CULTURE, BLOOD (ROUTINE X 2)  CULTURE, BLOOD (ROUTINE X 2)  URINE CULTURE  CULTURE, EXPECTORATED SPUTUM-ASSESSMENT  HCG, SERUM, QUALITATIVE  PROTIME-INR  APTT  LACTIC ACID, PLASMA  PROCALCITONIN  INFLUENZA PANEL BY PCR (TYPE A & B)  LACTIC ACID, PLASMA  HIV ANTIBODY (ROUTINE TESTING)  BASIC METABOLIC PANEL  CBC  CBC  CBC  PREPARE PLATELET PHERESIS  TYPE AND SCREEN  ABO/RH    EKG  EKG Interpretation None       Radiology Dg Chest 2 View  Result Date: 03/20/2017 CLINICAL DATA:  Patient seen today at [facility] medicine. She states she was diagnosed with Flu and Low platelets. She is complaining of weakness and a headache EXAM: CHEST  2 VIEW COMPARISON:  01/22/2017 FINDINGS: Midline trachea. Normal heart size and mediastinal contours. No pleural effusion or pneumothorax. Clear lungs. IMPRESSION: No acute cardiopulmonary disease. Electronically Signed   By: Jeronimo Greaves M.D.   On: 03/20/2017 18:15    Procedures Procedures (including critical care time)  Medications Ordered in ED Medications  0.9 %  sodium chloride infusion ( Intravenous Paused 03/20/17 2345)  dextromethorphan-guaiFENesin (MUCINEX DM) 30-600 MG  per 12 hr tablet 1 tablet (1 tablet Oral Given 03/20/17 2235)  vancomycin (VANCOCIN) IVPB 1000 mg/200 mL premix (not administered)  piperacillin-tazobactam (ZOSYN) IVPB 3.375 g (not administered)  sodium chloride flush (NS) 0.9 % injection 3 mL (3 mLs Intravenous Given 03/20/17 2237)  acetaminophen (TYLENOL) tablet 650 mg (not administered)    Or  acetaminophen (TYLENOL) suppository 650 mg (not administered)  ondansetron (ZOFRAN) tablet 4 mg (not administered)    Or  ondansetron (ZOFRAN) injection 4 mg (not administered)  zolpidem (AMBIEN) tablet 5 mg (not administered)  loratadine (CLARITIN) tablet 10 mg (not administered)    And  pseudoephedrine (SUDAFED) 12 hr tablet 120 mg (not administered)  acetaminophen (TYLENOL) tablet 650 mg (650 mg Oral Given 03/20/17 1841)  oseltamivir (TAMIFLU) capsule 75 mg (75 mg Oral Given 03/20/17 1841)  predniSONE (DELTASONE) tablet  60 mg (60 mg Oral Given 03/20/17 1841)  0.9 %  sodium chloride infusion (0 mL/hr Intravenous Paused 03/20/17 2345)  piperacillin-tazobactam (ZOSYN) IVPB 3.375 g (0 g Intravenous Stopped 03/20/17 2223)  vancomycin (VANCOCIN) IVPB 1000 mg/200 mL premix (0 mg Intravenous Stopped 03/21/17 0000)  Immune Globulin 10% (PRIVIGEN) IV infusion 85 g (85 g Intravenous Rate/Dose Verify 03/20/17 2300)  sodium chloride 0.9 % bolus 2,500 mL (0 mLs Intravenous Stopped 03/20/17 2237)     Initial Impression / Assessment and Plan / ED Course  I have reviewed the triage vital signs and the nursing notes.  Pertinent labs & imaging results that were available during my care of the patient were reviewed by me and considered in my medical decision making (see chart for details).     Patient's age 36 year old female with history of ITP. She is presenting with positive flu and platelets of 25. Patient currently has vaginal bleed. Patient sent here for treatment for IVIG. We'll get labs, type and screen. Will touch base with hematology for IVIG and steroid  recommendations.   12:24 AM Discussed with the on-call hematologist. They recommend 60 mg of oral prednisone, one dose of IVIG, 1 unit of platelets. There are also recommend consult for [place] in the morning.  Will start tamiflu as well. Patietn appears clinically well.   Final Clinical Impressions(s) / ED Diagnoses   Final diagnoses:  None    New Prescriptions Current Discharge Medication List       Aaditya Letizia Randall An, MD 03/21/17 [phone]

## 2017-03-20 NOTE — Progress Notes (Signed)
Pharmacy Antibiotic Note  [name] is a 36 y.o. female admitted on 03/20/2017 with treatment of low platelet count.  PMH significant for ITP, splenectomy.  Patient directed to ED from PCP due to low platelet count.   Notified today that patient tested + influenza.  Tamiflu x 1 dose given in ED today.  Pharmacy has been consulted for Vancomycin and Zosyn dosing for empiric treatment of suspected sepsis with unknown source of infection.  Plan:  Vancomycin 1gm IV q8h  Zosyn 3.375gm IV q8h (extended infusion)  Daily SCr   Height:  (162.6 cm) Weight: 190 lb (86.2 kg) IBW/kg (Calculated) : 54.7  Temp (24hrs), Avg:100.6 F (38.1 C), Min:100.6 F (38.1 C), Max:100.6 F (38.1 C)   Recent Labs Lab 03/20/17 1830  WBC 8.8  CREATININE 0.83    Estimated Creatinine Clearance: 101.5 mL/min (by C-G formula based on SCr of 0.83 mg/dL).    Allergies  Allergen Reactions  . Rituximab Swelling    Antimicrobials this admission: 4/24 vanc >>   4/24 zosyn >>    Thank you for allowing pharmacy to be a part of this patient's care.  Maryellen Pile, PharmD 03/20/2017 8:01 PM

## 2017-03-21 LAB — CBC
HCT: 33.6 % — ABNORMAL LOW (ref 36.0–46.0)
HCT: 34.1 % — ABNORMAL LOW (ref 36.0–46.0)
HEMATOCRIT: 32.7 % — AB (ref 36.0–46.0)
HEMOGLOBIN: 10.3 g/dL — AB (ref 12.0–15.0)
HEMOGLOBIN: 10.8 g/dL — AB (ref 12.0–15.0)
Hemoglobin: 11.1 g/dL — ABNORMAL LOW (ref 12.0–15.0)
MCH: 24.5 pg — AB (ref 26.0–34.0)
MCH: 24.8 pg — ABNORMAL LOW (ref 26.0–34.0)
MCH: 25.6 pg — ABNORMAL LOW (ref 26.0–34.0)
MCHC: 31.5 g/dL (ref 30.0–36.0)
MCHC: 32.1 g/dL (ref 30.0–36.0)
MCHC: 32.6 g/dL (ref 30.0–36.0)
MCV: 77.9 fL — AB (ref 78.0–100.0)
MCV: 77.2 fL — ABNORMAL LOW (ref 78.0–100.0)
MCV: 78.6 fL (ref 78.0–100.0)
PLATELETS: 73 10*3/uL — AB (ref 150–400)
PLATELETS: 75 10*3/uL — AB (ref 150–400)
Platelets: 67 10*3/uL — ABNORMAL LOW (ref 150–400)
RBC: 4.2 MIL/uL (ref 3.87–5.11)
RBC: 4.35 MIL/uL (ref 3.87–5.11)
RBC: 4.34 MIL/uL (ref 3.87–5.11)
RDW: 15.7 % — ABNORMAL HIGH (ref 11.5–15.5)
RDW: 15.7 % — ABNORMAL HIGH (ref 11.5–15.5)
RDW: 15.8 % — AB (ref 11.5–15.5)
WBC: 6.3 10*3/uL (ref 4.0–10.5)
WBC: 8.2 10*3/uL (ref 4.0–10.5)
WBC: 10.4 10*3/uL (ref 4.0–10.5)

## 2017-03-21 LAB — PREPARE PLATELET PHERESIS: Unit division: 0

## 2017-03-21 LAB — ABO/RH: ABO/RH(D): O POS

## 2017-03-21 LAB — BASIC METABOLIC PANEL
Anion gap: 4 — ABNORMAL LOW (ref 5–15)
BUN: 9 mg/dL (ref 6–20)
CHLORIDE: 109 mmol/L (ref 101–111)
CO2: 22 mmol/L (ref 22–32)
CREATININE: 0.79 mg/dL (ref 0.44–1.00)
Calcium: 7.7 mg/dL — ABNORMAL LOW (ref 8.9–10.3)
GFR calc non Af Amer: >60 mL/min (ref >60)
Glucose, Bld: 211 mg/dL — ABNORMAL HIGH (ref 65–99)
Potassium: 3.5 mmol/L (ref 3.5–5.1)
Sodium: 135 mmol/L (ref 135–145)

## 2017-03-21 LAB — GLUCOSE, CAPILLARY
GLUCOSE-CAPILLARY: 95 mg/dL (ref 65–99)
Glucose-Capillary: 62 mg/dL — ABNORMAL LOW (ref 65–99)
Glucose-Capillary: 64 mg/dL — ABNORMAL LOW (ref 65–99)

## 2017-03-21 LAB — BPAM PLATELET PHERESIS
BLOOD PRODUCT EXPIRATION DATE: 201804262359
ISSUE DATE / TIME: 201804242331
Unit Type and Rh: 5100

## 2017-03-21 LAB — HIV ANTIBODY (ROUTINE TESTING W REFLEX): HIV Screen 4th Generation wRfx: NONREACTIVE

## 2017-03-21 MED ORDER — DEXTROSE 50 % IV SOLN
INTRAVENOUS | Status: AC
  Filled 2017-03-21: qty 50

## 2017-03-21 MED ORDER — TRAMADOL HCL 50 MG PO TABS
50.0000 mg | ORAL_TABLET | Freq: Four times a day (QID) | ORAL | Status: DC | PRN
  Administered 2017-03-21 – 2017-03-22 (×3): 50 mg via ORAL
  Filled 2017-03-21 (×3): qty 1

## 2017-03-21 MED ORDER — DEXTROSE 50 % IV SOLN
25.0000 mL | Freq: Once | INTRAVENOUS | Status: AC
  Administered 2017-03-21: 25 mL via INTRAVENOUS

## 2017-03-21 MED ORDER — KETOROLAC TROMETHAMINE 30 MG/ML IJ SOLN: 30.0000 mg | Freq: Four times a day (QID) | INTRAMUSCULAR | Status: DC | PRN

## 2017-03-21 MED ORDER — POLYETHYLENE GLYCOL 3350 17 G PO PACK
17.0000 g | PACK | Freq: Every day | ORAL | Status: DC
  Administered 2017-03-21 – 2017-03-23 (×3): 17 g via ORAL
  Filled 2017-03-21 (×3): qty 1

## 2017-03-21 MED ORDER — OSELTAMIVIR PHOSPHATE 75 MG PO CAPS
75.0000 mg | ORAL_CAPSULE | Freq: Two times a day (BID) | ORAL | Status: DC
  Administered 2017-03-21 – 2017-03-23 (×5): 75 mg via ORAL
  Filled 2017-03-21 (×6): qty 1

## 2017-03-21 MED ORDER — PANTOPRAZOLE SODIUM 40 MG PO TBEC
40.0000 mg | DELAYED_RELEASE_TABLET | Freq: Every day | ORAL | Status: DC
  Administered 2017-03-21 – 2017-03-23 (×3): 40 mg via ORAL
  Filled 2017-03-21 (×3): qty 1

## 2017-03-21 NOTE — Progress Notes (Signed)
CRITICAL VALUE ALERT  Critical value received:  62, then up to 64 after 15 minutes  Date of notification:  03/21/17  Time of notification:  1610  Critical value read back:Yes.    Nurse who received alert:  Hulda Marin  MD notified (1st page):  Dr. Rhona Leavens  Time of first page:  1730  MD notified (2nd page):  Time of second page:  Responding MD:  Dr. Rhona Leavens  Time MD responded:  Md texted

## 2017-03-21 NOTE — Progress Notes (Signed)
CBG rechecked up to 64. Patient c/o n/v, unable to tolerate orange juice. D50 25 ml Iv administered. Will recheck blood sugar in 15 minutes.

## 2017-03-21 NOTE — Care Management Note (Signed)
Case Management Note  Patient Details  Name: [name] MRN: [id number] Date of Birth: 08/26/1981  Subjective/Objective:          Flu versus sepsis          Action/Plan: Date:  March 21, 2017 Chart reviewed for concurrent status and case management needs. Will continue to follow patient progress. Discharge Planning: following for needs Expected discharge date: [id number] [name], BSN, [place], [place]   [phone]  Expected Discharge Date:   (unknown)               Expected Discharge Plan:  Home/Self Care  In-House Referral:     Discharge planning Services     Post Acute Care Choice:    Choice offered to:     DME Arranged:    DME Agency:     HH Arranged:    HH Agency:     Status of Service:  In process, will continue to follow  If discussed at Long Length of Stay Meetings, dates discussed:    Additional Comments:  Golda Acre, RN 03/21/2017, 8:59 AM

## 2017-03-21 NOTE — Progress Notes (Signed)
Patient c/o chest tightness,feels like some sitting on my chest". Vital signs,BP-141/71,PR-66,RR-20 Temp. 98.9. EkG performed per Dr. Rhona Leavens, result was normal EKG,again  MD notified. Awaiting for MR call. Will continue to monitor.

## 2017-03-21 NOTE — Progress Notes (Signed)
PROGRESS NOTE    [name]  JPE:[id number] DOB: 1980/12/14 DOA: 03/20/2017 PCP: Osborne Casco, MD    Brief Narrative:  36 y.o. female with medical history significant of ITP, s/p of splenomegaly, who presents with fever, vaginal bleeding and low platelet.  Pt states that she has history of ITP and was treated with IVIG March 2017. Today she states that she has fever and cough with yellow colored sputum production. Denies runny nose, sore throat, chest pain or SOB. She was seen by her PCP today and found to have platelet 25, was sent to ED for further evaluation and treatment. Patient states that she has been having vaginal bleeding in the past 3 days, initially was heavy, and now becomes lighter. No hematochezia, hematuria or hematemesis. Patient does not have confusion, nausea, vomiting, diarrhea, abdominal pain, symptoms of UTI or unilateral weakness. She has some mild bilateral hip pain.  Assessment & Plan:   Principal Problem:   Idiopathic thrombocytopenic purpura ([place]) Active Problems:   Vaginal bleeding   Sepsis ([place])   Chronic ITP (idiopathic thrombocytopenic purpura) (HCC)  ITP exacerbation: Plts noted to be 16 on admission. Noted to have vaginal bleeding at presentation. Hematologist, Dr. Addison Bailey was consulted by EDP, who  recommended to give prednisone 60 mg 1, 1 unit for platelets and start IVIG.  -Stable thus far with plts rising to 75 -given prednisone 60 mg 1, 1 unit for platelets, and IVIG  Sepsis secondary to influenza: Patient met criteria for sepsis with fever and tachypnea. Hemodynamically stable. Likely due to upper respiratory viral infection. Since patient is s/p of splenomegaly and immunosuppressed-->started on empiric antibiotics with broad coverage with vanc and zosyn -Thus far, no identifiable source of infection -Will narrow coverage to zosyn monotherapy -Reportedly flu pos recently. Started on tamiflu  Vaginal bleeding: Hemoglobin 12.4,  platelets and 16 at presentation Hemodynamically stable at present -Given 1 unit of platelets as above  DVT prophylaxis: SCD's Code Status: Full Family Communication: Pt in room, family at bedside Disposition Plan: Uncertain at this time  Consultants:   Oncology  Procedures:     Antimicrobials: Anti-infectives    Start     Dose/Rate Route Frequency Ordered Stop   03/21/17 1000  oseltamivir (TAMIFLU) capsule 75 mg     75 mg Oral 2 times daily 03/21/17 0453 03/25/17 2159   03/21/17 0600  vancomycin (VANCOCIN) IVPB 1000 mg/200 mL premix  Status:  Discontinued     1,000 mg 200 mL/hr over 60 Minutes Intravenous Every 8 hours 03/20/17 2011 03/21/17 1217   03/21/17 0500  piperacillin-tazobactam (ZOSYN) IVPB 3.375 g     3.375 g 12.5 mL/hr over 240 Minutes Intravenous Every 8 hours 03/20/17 2011     03/20/17 2015  piperacillin-tazobactam (ZOSYN) IVPB 3.375 g     3.375 g 100 mL/hr over 30 Minutes Intravenous STAT 03/20/17 2001 03/20/17 2223   03/20/17 2015  vancomycin (VANCOCIN) IVPB 1000 mg/200 mL premix     1,000 mg 200 mL/hr over 60 Minutes Intravenous STAT 03/20/17 2001 03/21/17 0000   03/20/17 1745  oseltamivir (TAMIFLU) capsule 75 mg     75 mg Oral  Once 03/20/17 1730 03/20/17 1841      Subjective: No complaints at this time  Objective: Vitals:   03/21/17 1225 03/21/17 1343 03/21/17 1409 03/21/17 1607  BP: 128/84 133/84 (!) 141/81 140/88  Pulse: 68 64 66 (!) 102  Resp: 16  20   Temp: 99 F (37.2 C) 98.8 F (37.1 C) 98.9 F (  37.2 C) 99.3 F (37.4 C)  TempSrc: Oral Oral Oral Oral  SpO2: 98% 98% 98% 96%  Weight:      Height:        Intake/Output Summary (Last 24 hours) at 03/21/17 1828 Last data filed at 03/21/17 1718  Gross per 24 hour  Intake          6169.66 ml  Output             1225 ml  Net          4944.66 ml   Filed Weights   03/20/17 2059 03/20/17 2200 03/21/17 0546  Weight: 89.4 kg (197 lb) 88.4 kg (194 lb 14.2 oz) 91 kg (200 lb 9.9 oz)     Examination:  General exam: Appears calm and comfortable  Respiratory system: Clear to auscultation. Respiratory effort normal. Cardiovascular system: S1 & S2 heard, RRR Gastrointestinal system: Abdomen is nondistended, soft and nontender. No organomegaly or masses felt. Normal bowel sounds heard. Central nervous system: Alert and oriented. No focal neurological deficits. Extremities: Symmetric 5 x 5 power. Skin: No rashes, lesions Psychiatry: Judgement and insight appear normal. Mood & affect appropriate.   Data Reviewed: I have personally reviewed following labs and imaging studies  CBC:  Recent Labs Lab 03/20/17 1830 03/20/17 2215 03/21/17 0320 03/21/17 0843 03/21/17 1418  WBC 8.8 5.8 6.3 8.2 10.4  NEUTROABS 5.3  --   --   --   --   HGB 12.4 11.3* 10.3* 10.8* 11.1*  HCT 37.8 34.7* 32.7* 33.6* 34.1*  MCV 78.4 78.3 77.9* 77.2* 78.6  PLT 16* 13* 67* 73* 75*   Basic Metabolic Panel:  Recent Labs Lab 03/20/17 1830 03/21/17 0320  NA 136 135  K 3.7 3.5  CL 103 109  CO2 27 22  GLUCOSE 89 211*  BUN 6 9  CREATININE 0.83 0.79  CALCIUM 8.8* 7.7*   GFR: Estimated Creatinine Clearance: 108.2 mL/min (by C-G formula based on SCr of 0.79 mg/dL). Liver Function Tests:  Recent Labs Lab 03/20/17 1830  AST 19  ALT 11*  ALKPHOS 80  BILITOT 0.7  PROT 8.0  ALBUMIN 3.7   No results for input(s): LIPASE, AMYLASE in the last 168 hours. No results for input(s): AMMONIA in the last 168 hours. Coagulation Profile:  Recent Labs Lab 03/20/17 1830  INR 1.06   Cardiac Enzymes: No results for input(s): CKTOTAL, CKMB, CKMBINDEX, TROPONINI in the last 168 hours. BNP (last 3 results) No results for input(s): PROBNP in the last 8760 hours. HbA1C: No results for input(s): HGBA1C in the last 72 hours. CBG:  Recent Labs Lab 03/21/17 1610 03/21/17 1630  GLUCAP 62* 64*   Lipid Profile: No results for input(s): CHOL, HDL, LDLCALC, TRIG, CHOLHDL, LDLDIRECT in the last 72  hours. Thyroid Function Tests: No results for input(s): TSH, T4TOTAL, FREET4, T3FREE, THYROIDAB in the last 72 hours. Anemia Panel: No results for input(s): VITAMINB12, FOLATE, FERRITIN, TIBC, IRON, RETICCTPCT in the last 72 hours. Sepsis Labs:  Recent Labs Lab 03/20/17 2215 03/20/17 2230  PROCALCITON <0.10  --   LATICACIDVEN  --  0.7    Recent Results (from the past 240 hour(s))  MRSA PCR Screening     Status: None   Collection Time: 03/20/17  9:59 PM  Result Value Ref Range Status   MRSA by PCR NEGATIVE NEGATIVE Final    Comment:        The GeneXpert MRSA Assay (FDA approved for NASAL specimens only), is one component of a  comprehensive MRSA colonization surveillance program. It is not intended to diagnose MRSA infection nor to guide or monitor treatment for MRSA infections.      Radiology Studies: Dg Chest 2 View  Result Date: 03/20/2017 CLINICAL DATA:  Patient seen today at [facility]. She states she was diagnosed with Flu and Low platelets. She is complaining of weakness and a headache EXAM: CHEST  2 VIEW COMPARISON:  01/22/2017 FINDINGS: Midline trachea. Normal heart size and mediastinal contours. No pleural effusion or pneumothorax. Clear lungs. IMPRESSION: No acute cardiopulmonary disease. Electronically Signed   By: Abigail Miyamoto M.D.   On: 03/20/2017 18:15    Scheduled Meds: . dextromethorphan-guaiFENesin  1 tablet Oral BID  . dextrose      . oseltamivir  75 mg Oral BID  . pantoprazole  40 mg Oral Daily  . polyethylene glycol  17 g Oral Daily  . sodium chloride flush  3 mL Intravenous Q12H   Continuous Infusions: . sodium chloride 100 mL/hr at 03/21/17 0952  . piperacillin-tazobactam (ZOSYN)  IV Stopped (03/21/17 1752)     LOS: 1 day   Princella Jaskiewicz, Orpah Melter, MD [facility] Pager [phone]  If 7PM-7AM, please contact night-coverage [email or web address] Password [facility] 03/21/2017, 6:28 PM

## 2017-03-21 NOTE — Progress Notes (Signed)
CBG up to 95. Able to consume the jello and some carrots. Patient felt better. Verbalized relief from chest tightness and also nausea. Will notify Dr. Rhona Leavens re: the events.

## 2017-03-21 NOTE — Progress Notes (Signed)
Patient shaky,feels cold. Vital signs obtained BP-140 88,PR-93,Rr-20,temperature 99.3. CBG obtained it was 62., orange juice given. Will continue to monitor.

## 2017-03-21 NOTE — Progress Notes (Signed)
MD wanted repeat flu test sent. I spoke with patient about repeating the flu swab that was done at her PCP office. She said she did not wish to repeat the swab when she's already started treatment for the flu and has already filled her Tamiflu prescription from her PCP. Repeat flu swab was not done per patient's wishes

## 2017-03-21 NOTE — Progress Notes (Signed)
Patient transferred from ICU. Alert and oriented x3. Denies pain. Oriented to room and environment. Will continue to monitor. 

## 2017-03-22 DIAGNOSIS — G43909 Migraine, unspecified, not intractable, without status migrainosus: Secondary | ICD-10-CM

## 2017-03-22 DIAGNOSIS — L409 Psoriasis, unspecified: Secondary | ICD-10-CM

## 2017-03-22 DIAGNOSIS — D693 Immune thrombocytopenic purpura: Principal | ICD-10-CM

## 2017-03-22 DIAGNOSIS — D509 Iron deficiency anemia, unspecified: Secondary | ICD-10-CM

## 2017-03-22 DIAGNOSIS — D72829 Elevated white blood cell count, unspecified: Secondary | ICD-10-CM

## 2017-03-22 DIAGNOSIS — J111 Influenza due to unidentified influenza virus with other respiratory manifestations: Secondary | ICD-10-CM

## 2017-03-22 LAB — CBC
HCT: 33.6 % — ABNORMAL LOW (ref 36.0–46.0)
HEMOGLOBIN: 10.8 g/dL — AB (ref 12.0–15.0)
MCH: 24.8 pg — ABNORMAL LOW (ref 26.0–34.0)
MCHC: 32.1 g/dL (ref 30.0–36.0)
MCV: 77.1 fL — AB (ref 78.0–100.0)
Platelets: 112 10*3/uL — ABNORMAL LOW (ref 150–400)
RBC: 4.36 MIL/uL (ref 3.87–5.11)
RDW: 16 % — ABNORMAL HIGH (ref 11.5–15.5)
WBC: 12.2 10*3/uL — AB (ref 4.0–10.5)

## 2017-03-22 LAB — BASIC METABOLIC PANEL
ANION GAP: 7 (ref 5–15)
BUN: <5 mg/dL — ABNORMAL LOW (ref 6–20)
CHLORIDE: 107 mmol/L (ref 101–111)
CO2: 22 mmol/L (ref 22–32)
Calcium: 7.7 mg/dL — ABNORMAL LOW (ref 8.9–10.3)
Creatinine, Ser: 0.8 mg/dL (ref 0.44–1.00)
GFR calc non Af Amer: >60 mL/min (ref >60)
Glucose, Bld: 111 mg/dL — ABNORMAL HIGH (ref 65–99)
Potassium: 3.7 mmol/L (ref 3.5–5.1)
SODIUM: 136 mmol/L (ref 135–145)

## 2017-03-22 LAB — EXPECTORATED SPUTUM ASSESSMENT W GRAM STAIN, RFLX TO RESP C

## 2017-03-22 LAB — EXPECTORATED SPUTUM ASSESSMENT W REFEX TO RESP CULTURE

## 2017-03-22 LAB — URINE CULTURE: Culture: NO GROWTH

## 2017-03-22 MED ORDER — PREDNISONE 20 MG PO TABS
60.0000 mg | ORAL_TABLET | Freq: Every day | ORAL | Status: DC
  Administered 2017-03-22 – 2017-03-23 (×2): 60 mg via ORAL
  Filled 2017-03-22 (×2): qty 3

## 2017-03-22 MED ORDER — DIPHENHYDRAMINE HCL 50 MG/ML IJ SOLN
12.5000 mg | Freq: Once | INTRAMUSCULAR | Status: AC
  Administered 2017-03-22: 12.5 mg via INTRAVENOUS
  Filled 2017-03-22: qty 1

## 2017-03-22 MED ORDER — KETOROLAC TROMETHAMINE 30 MG/ML IJ SOLN
30.0000 mg | Freq: Once | INTRAMUSCULAR | Status: AC
  Administered 2017-03-22: 30 mg via INTRAVENOUS
  Filled 2017-03-22: qty 1

## 2017-03-22 MED ORDER — METOCLOPRAMIDE HCL 5 MG/ML IJ SOLN
5.0000 mg | Freq: Once | INTRAMUSCULAR | Status: AC
  Administered 2017-03-22: 5 mg via INTRAVENOUS
  Filled 2017-03-22: qty 2

## 2017-03-22 MED ORDER — AMOXICILLIN-POT CLAVULANATE 875-125 MG PO TABS
1.0000 | ORAL_TABLET | Freq: Two times a day (BID) | ORAL | Status: DC
  Administered 2017-03-22 – 2017-03-23 (×3): 1 via ORAL
  Filled 2017-03-22 (×3): qty 1

## 2017-03-22 NOTE — Consult Note (Signed)
[facility] CONSULT NOTE  Patient Care Team: Maurice Small, MD as PCP - General (Family Medicine) Artis Delay, MD as Consulting Physician (Hematology and Oncology)  CHIEF COMPLAINTS/PURPOSE OF CONSULTATION:  Recurrent ITP  HISTORY OF PRESENTING ILLNESS:  [name] 36 y.o. female is admitted to the hospital after presentation to the emergency department with bleeding and recurrent ITP This patient is well-known to me.  She was last seen a year ago. Summary of hematologic history: This is a very pleasant lady with history of chronic ITP. 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 She responded well to prednisone but due to the excessive weight gain and other side effects, prefer not to be treated with prednisone. When she was treated with rituximab before 2010, she did develop significant allergic reaction. Her last treatment with IVIG was in 2011. Currently she's been placed on observation She was admitted to the hospital from 01/26/2016 to 01/28/2016 with recurrence of ITP. She received IVIG and high-dose dexamethasone with improvement of her platelet count She was found to have abnormal CBC from after recent presentation to the urgent care.  Her platelet count was very low and she has vaginal bleeding.  She presented to the emergency department 2 days ago and was admitted. She felt that this episode of recurrence of ITP was triggered by stress and recent infection. She had one episode of infection in February, resolved with treatment.  Back then, her platelet count was still well over 100,000 This time, on admission, her platelet count was as low as 13,000 Since admission, she had multiple investigation. Influenza panel apparently was positive.  She was placed on Tamiflu. She also fulfill the criteria for sepsis and was prescribed broad-spectrum IV antibiotics.  On admission, she received platelet transfusion. Her vaginal  bleeding has almost completely resolved.  She received IVIG along with steroid treatment She denies recent bruising/bleeding, such as spontaneous epistaxis, hematuria, melena or hematochezia She complained of terrible migraine headaches since admission. She had recent flare of psoriasis as well. She continues to have mild cough, with low grade fever No recent diarrhea, dysuria, frequency or urgency  MEDICAL HISTORY:  Past Medical History:  Diagnosis Date  . Fatigue 08/27/2013  . ITP (idiopathic thrombocytopenic purpura)   . Need for prophylactic vaccination and inoculation against influenza 08/27/2013    SURGICAL HISTORY: Past Surgical History:  Procedure Laterality Date  . splenetomy  2006    SOCIAL HISTORY: Social History   Social History  . Marital status: Married    Spouse name: N/A  . Number of children: 0  . Years of education: N/A   Occupational History  .  [employer]    Engineer, technical sales, [employer]   Social History Main Topics  . Smoking status: Never Smoker  . Smokeless tobacco: Never Used  . Alcohol use Yes     Comment: occasionally  . Drug use: No  . Sexual activity: Not on file   Other Topics Concern  . Not on file   Social History Narrative  . No narrative on file    FAMILY HISTORY: Family History  Problem Relation Age of Onset  . Irritable bowel syndrome Mother   . Dementia Maternal Grandmother     ALLERGIES:  is allergic to rituximab.  MEDICATIONS:  Current Facility-Administered Medications  Medication Dose Route Frequency Provider Last Rate Last Dose  . 0.9 %  sodium chloride infusion   Intravenous Continuous Lorretta Harp, MD 100 mL/hr  at 03/22/17 0752    . acetaminophen (TYLENOL) tablet 650 mg  650 mg Oral Q6H PRN Lorretta Harp, MD   650 mg at 03/22/17 0408   Or  . acetaminophen (TYLENOL) suppository 650 mg  650 mg Rectal Q6H PRN Lorretta Harp, MD      . amoxicillin-clavulanate (AUGMENTIN) 875-125 MG per tablet 1 tablet  1 tablet Oral Q12H  Jerald Kief, MD      . dextromethorphan-guaiFENesin [facility] DM) 30-600 MG per 12 hr tablet 1 tablet  1 tablet Oral BID Lorretta Harp, MD   1 tablet at 03/22/17 1051  . diphenhydrAMINE (BENADRYL) injection 12.5 mg  12.5 mg Intravenous Once Jerald Kief, MD      . ketorolac (TORADOL) 30 MG/ML injection 30 mg  30 mg Intravenous Once Jerald Kief, MD      . loratadine (CLARITIN) tablet 10 mg  10 mg Oral Daily PRN Lorretta Harp, MD       And  . pseudoephedrine (SUDAFED) 12 hr tablet 120 mg  120 mg Oral Q12H PRN Lorretta Harp, MD   120 mg at 03/22/17 0458  . metoCLOPramide (REGLAN) injection 5 mg  5 mg Intravenous Once Jerald Kief, MD      . ondansetron [facility]) tablet 4 mg  4 mg Oral Q6H PRN Lorretta Harp, MD       Or  . ondansetron [facility]) injection 4 mg  4 mg Intravenous Q6H PRN Lorretta Harp, MD   4 mg at 03/21/17 1637  . oseltamivir (TAMIFLU) capsule 75 mg  75 mg Oral BID Leda Gauze, NP   75 mg at 03/22/17 1119  . pantoprazole (PROTONIX) EC tablet 40 mg  40 mg Oral Daily Jerald Kief, MD   40 mg at 03/22/17 1052  . polyethylene glycol (MIRALAX / GLYCOLAX) packet 17 g  17 g Oral Daily Jerald Kief, MD   17 g at 03/22/17 1052  . sodium chloride flush (NS) 0.9 % injection 3 mL  3 mL Intravenous Q12H Lorretta Harp, MD   3 mL at 03/21/17 1029  . traMADol (ULTRAM) tablet 50 mg  50 mg Oral Q6H PRN Jerald Kief, MD   50 mg at 03/22/17 0458  . zolpidem (AMBIEN) tablet 5 mg  5 mg Oral QHS PRN Lorretta Harp, MD   5 mg at 03/21/17 2110    REVIEW OF SYSTEMS:   Constitutional: Denies fevers, chills or abnormal night sweats Eyes: Denies blurriness of vision, double vision or watery eyes Ears, nose, mouth, throat, and face: Denies mucositis or sore throat Cardiovascular: Denies palpitation, chest discomfort or lower extremity swelling Gastrointestinal:  Denies nausea, heartburn or change in bowel habits Lymphatics: Denies new lymphadenopathy or easy bruising Neurological:Denies numbness, tingling or new  weaknesses Behavioral/Psych: Mood is stable, no new changes  All other systems were reviewed with the patient and are negative.  PHYSICAL EXAMINATION: ECOG PERFORMANCE STATUS: 1 - Symptomatic but completely ambulatory  Vitals:   03/21/17 2055 03/22/17 0545  BP: 123/72 127/78  Pulse: (!) 111 97  Resp: 20 20  Temp: 99.8 F (37.7 C) (!) 100.4 F (38 C)   Filed Weights   03/20/17 2059 03/20/17 2200 03/21/17 0546  Weight: 197 lb (89.4 kg) 194 lb 14.2 oz (88.4 kg) 200 lb 9.9 oz (91 kg)    GENERAL:alert, no distress and comfortable SKIN: Noted mild acne on her face EYES: normal, conjunctiva are pink and non-injected, sclera clear OROPHARYNX:no exudate, no erythema and lips, buccal mucosa,  and tongue normal  NECK: supple, thyroid normal size, non-tender, without nodularity LYMPH:  no palpable lymphadenopathy in the cervical, axillary or inguinal LUNGS: clear to auscultation and percussion with normal breathing effort HEART: regular rate & rhythm and no murmurs and no lower extremity edema ABDOMEN:abdomen soft, non-tender and normal bowel sounds Musculoskeletal:no cyanosis of digits and no clubbing  PSYCH: alert & oriented x 3 with fluent speech NEURO: no focal motor/sensory deficits  LABORATORY DATA:  I have reviewed the data as listed Lab Results  Component Value Date   WBC 12.2 (H) 03/22/2017   HGB 10.8 (L) 03/22/2017   HCT 33.6 (L) 03/22/2017   MCV 77.1 (L) 03/22/2017   PLT 112 (L) 03/22/2017     RADIOGRAPHIC STUDIES: I have personally reviewed the radiological images as listed and agreed with the findings in the report. Dg Chest 2 View  Result Date: 03/20/2017 CLINICAL DATA:  Patient seen today at [facility] medicine. She states she was diagnosed with Flu and Low platelets. She is complaining of weakness and a headache EXAM: CHEST  2 VIEW COMPARISON:  01/22/2017 FINDINGS: Midline trachea. Normal heart size and mediastinal contours. No pleural effusion or pneumothorax.  Clear lungs. IMPRESSION: No acute cardiopulmonary disease. Electronically Signed   By: Jeronimo Greaves M.D.   On: 03/20/2017 18:15    ASSESSMENT & PLAN Recurrent ITP Likely triggered by recent infection I recommend prednisone 60 mg daily I will set up appointment for her to see me next week with plan for prednisone taper in the outpatient  Leukocytosis, influenza infection, questionable sepsis She has responded well to antibiotic therapy Would defer to primary service to narrow down the choice of antibiotics  Microcytic anemia Could be related to recent bleeding I will check iron studies tomorrow  Psoriasis Recent psoriasis flare will likely improve with prednisone treatment Continue prednisone  Migraine headaches Could be triggered by stress Defer to primary service for further management  Discharge planning Hopefully the next 24-48 hours Please call if questions arise I will set up appointment to see her back next week on 03/28/2017.   All questions were answered. The patient knows to call the clinic with any problems, questions or concerns. No barriers to learning was detected.   Artis Delay, MD 03/22/2017 1:23 PM

## 2017-03-22 NOTE — Progress Notes (Signed)
PROGRESS NOTE    [name]  OLM:[id number] DOB: 11/14/1981 DOA: 03/20/2017 PCP: Osborne Casco, MD    Brief Narrative:  36 y.o. female with medical history significant of ITP, s/p of splenomegaly, who presents with fever, vaginal bleeding and low platelet.  Pt states that she has history of ITP and was treated with IVIG March 2017. Today she states that she has fever and cough with yellow colored sputum production. Denies runny nose, sore throat, chest pain or SOB. She was seen by her PCP today and found to have platelet 25, was sent to ED for further evaluation and treatment. Patient states that she has been having vaginal bleeding in the past 3 days, initially was heavy, and now becomes lighter. No hematochezia, hematuria or hematemesis. Patient does not have confusion, nausea, vomiting, diarrhea, abdominal pain, symptoms of UTI or unilateral weakness. She has some mild bilateral hip pain.  Assessment & Plan:   Principal Problem:   Idiopathic thrombocytopenic purpura ([place]) Active Problems:   Vaginal bleeding   Sepsis ([place])   Chronic ITP (idiopathic thrombocytopenic purpura) ([facility])  ITP exacerbation: Plts noted to be 16 on admission. Noted to have vaginal bleeding at presentation. Hematologist, Dr. Addison Bailey was consulted by EDP, who  recommended to give prednisone 60 mg 1, 1 unit for platelets and start IVIG.  -Patient seen with Dr. Alvy Bimler. Plan to continue with prednisone - Plts have improved to the 120 range - Repeat CBC in AM  Sepsis secondary to influenza: Patient met criteria for sepsis with fever and tachypnea. Hemodynamically stable. Likely due to upper respiratory viral infection. Since patient is s/p of splenomegaly and immunosuppressed-->started on empiric antibiotics with broad coverage with vanc and zosyn -Have narrowed coverage to zosyn monotherapy. Will transition to PO augmentin -Reportedly flu pos recently. Continued on tamiflu  Vaginal bleeding:  Hemoglobin 12.4, platelets and 16 at presentation Hemodynamically stable at present -Given 1 unit of platelets as above  Migraine headache - Will give trial of toradol, beneadryl, reglan - Patient states headaches historically improved with ibuprofen  DVT prophylaxis: SCD's Code Status: Full Family Communication: Pt in room, family at bedside Disposition Plan: Uncertain at this time  Consultants:   Oncology  Procedures:     Antimicrobials: Anti-infectives    Start     Dose/Rate Route Frequency Ordered Stop   03/22/17 1400  amoxicillin-clavulanate (AUGMENTIN) 875-125 MG per tablet 1 tablet     1 tablet Oral Every 12 hours 03/22/17 1310     03/21/17 1000  oseltamivir (TAMIFLU) capsule 75 mg     75 mg Oral 2 times daily 03/21/17 0453 03/25/17 2159   03/21/17 0600  vancomycin (VANCOCIN) IVPB 1000 mg/200 mL premix  Status:  Discontinued     1,000 mg 200 mL/hr over 60 Minutes Intravenous Every 8 hours 03/20/17 2011 03/21/17 1217   03/21/17 0500  piperacillin-tazobactam (ZOSYN) IVPB 3.375 g  Status:  Discontinued     3.375 g 12.5 mL/hr over 240 Minutes Intravenous Every 8 hours 03/20/17 2011 03/22/17 1310   03/20/17 2015  piperacillin-tazobactam (ZOSYN) IVPB 3.375 g     3.375 g 100 mL/hr over 30 Minutes Intravenous STAT 03/20/17 2001 03/20/17 2223   03/20/17 2015  vancomycin (VANCOCIN) IVPB 1000 mg/200 mL premix     1,000 mg 200 mL/hr over 60 Minutes Intravenous STAT 03/20/17 2001 03/21/17 0000   03/20/17 1745  oseltamivir (TAMIFLU) capsule 75 mg     75 mg Oral  Once 03/20/17 1730 03/20/17 1841  Subjective: Complaining of frontal headaches associated with phonophobia  Objective: Vitals:   03/21/17 1607 03/21/17 2055 03/22/17 0545 03/22/17 1413  BP: 140/88 123/72 127/78 133/66  Pulse: (!) 102 (!) 111 97 88  Resp:  _0 Temp: 99.3 F (37.4 C) 99.8 F (37.7 C) (!) 100.4 F (38 C) 100 F (37.8 C)  TempSrc: Oral Oral Oral Oral  SpO2: 96% 94% 94% 90%  Weight:       Height:        Intake/Output Summary (Last 24 hours) at 03/22/17 1533 Last data filed at 03/22/17 0547  Gross per 24 hour  Intake           726.67 ml  Output                0 ml  Net           726.67 ml   Filed Weights   03/20/17 2059 03/20/17 2200 03/21/17 0546  Weight: 89.4 kg (197 lb) 88.4 kg (194 lb 14.2 oz) 91 kg (200 lb 9.9 oz)    Examination:  General exam: Laying in bed, awake, in nad Respiratory system: normal resp effort, no audible wheezing Cardiovascular system: regular rate, s1, s2 Gastrointestinal system: soft, nondistended, pos BS Central nervous system: cn2-12 grossly intact, strength intact Extremities: no clubbing, no cyanosis Skin: perfused, no pallor Psychiatry: mood normal// no visual hallucinations  Data Reviewed: I have personally reviewed following labs and imaging studies  CBC:  Recent Labs Lab 03/20/17 1830 03/20/17 2215 03/21/17 0320 03/21/17 0843 03/21/17 1418 03/22/17 0447  WBC 8.8 5.8 6.3 8.2 10.4 12.2*  NEUTROABS 5.3  --   --   --   --   --   HGB 12.4 11.3* 10.3* 10.8* 11.1* 10.8*  HCT 37.8 34.7* 32.7* 33.6* 34.1* 33.6*  MCV 78.4 78.3 77.9* 77.2* 78.6 77.1*  PLT 16* 13* 67* 73* 75* 768*   Basic Metabolic Panel:  Recent Labs Lab 03/20/17 1830 03/21/17 0320 03/22/17 0447  NA 136 135 136  K 3.7 3.5 3.7  CL 103 109 107  CO2 _1 GLUCOSE 89 211* 111*  BUN 6 9 <5*  CREATININE 0.83 0.79 0.80  CALCIUM 8.8* 7.7* 7.7*   GFR: Estimated Creatinine Clearance: 108.2 mL/min (by C-G formula based on SCr of 0.8 mg/dL). Liver Function Tests:  Recent Labs Lab 03/20/17 1830  AST 19  ALT 11*  ALKPHOS 80  BILITOT 0.7  PROT 8.0  ALBUMIN 3.7   No results for input(s): LIPASE, AMYLASE in the last 168 hours. No results for input(s): AMMONIA in the last 168 hours. Coagulation Profile:  Recent Labs Lab 03/20/17 1830  INR 1.06   Cardiac Enzymes: No results for input(s): CKTOTAL, CKMB, CKMBINDEX, TROPONINI in the last 168  hours. BNP (last 3 results) No results for input(s): PROBNP in the last 8760 hours. HbA1C: No results for input(s): HGBA1C in the last 72 hours. CBG:  Recent Labs Lab 03/21/17 1610 03/21/17 1630 03/21/17 1719  GLUCAP 62* 64* 95   Lipid Profile: No results for input(s): CHOL, HDL, LDLCALC, TRIG, CHOLHDL, LDLDIRECT in the last 72 hours. Thyroid Function Tests: No results for input(s): TSH, T4TOTAL, FREET4, T3FREE, THYROIDAB in the last 72 hours. Anemia Panel: No results for input(s): VITAMINB12, FOLATE, FERRITIN, TIBC, IRON, RETICCTPCT in the last 72 hours. Sepsis Labs:  Recent Labs Lab 03/20/17 2215 03/20/17 2230  PROCALCITON <0.10  --   LATICACIDVEN  --  0.7    Recent Results (  from the past 240 hour(s))  Urine culture     Status: None   Collection Time: 03/20/17  6:30 PM  Result Value Ref Range Status   Specimen Description URINE, RANDOM  Final   Special Requests NONE  Final   Culture   Final    NO GROWTH Performed at [facility], [address], [place], [place] [address]    Report Status 03/22/2017 FINAL  Final  MRSA PCR Screening     Status: None   Collection Time: 03/20/17  9:59 PM  Result Value Ref Range Status   MRSA by PCR NEGATIVE NEGATIVE Final    Comment:        The GeneXpert MRSA Assay (FDA approved for NASAL specimens only), is one component of a comprehensive MRSA colonization surveillance program. It is not intended to diagnose MRSA infection nor to guide or monitor treatment for MRSA infections.   Culture, blood (Routine X 2) w Reflex to ID Panel     Status: None (Preliminary result)   Collection Time: 03/20/17 10:14 PM  Result Value Ref Range Status   Specimen Description BLOOD LEFT ANTECUBITAL  Final   Special Requests   Final    BOTTLES DRAWN AEROBIC AND ANAEROBIC Blood Culture adequate volume   Culture   Final    NO GROWTH 1 DAY Performed at [facility], [place] [address], [place], [place] [address]    Report Status  PENDING  Incomplete  Culture, blood (Routine X 2) w Reflex to ID Panel     Status: None (Preliminary result)   Collection Time: 03/20/17 10:14 PM  Result Value Ref Range Status   Specimen Description BLOOD RIGHT ANTECUBITAL  Final   Special Requests   Final    BOTTLES DRAWN AEROBIC AND ANAEROBIC Blood Culture adequate volume   Culture   Final    NO GROWTH 1 DAY Performed at [facility], [place] [address] [place], [place] [address]    Report Status PENDING  Incomplete     Radiology Studies: Dg Chest 2 View  Result Date: 03/20/2017 CLINICAL DATA:  Patient seen today at [facility]. She states she was diagnosed with Flu and Low platelets. She is complaining of weakness and a headache EXAM: CHEST  2 VIEW COMPARISON:  01/22/2017 FINDINGS: Midline trachea. Normal heart size and mediastinal contours. No pleural effusion or pneumothorax. Clear lungs. IMPRESSION: No acute cardiopulmonary disease. Electronically Signed   By: Abigail Miyamoto M.D.   On: 03/20/2017 18:15    Scheduled Meds: . amoxicillin-clavulanate  1 tablet Oral Q12H  . dextromethorphan-guaiFENesin  1 tablet Oral BID  . oseltamivir  75 mg Oral BID  . pantoprazole  40 mg Oral Daily  . polyethylene glycol  17 g Oral Daily  . predniSONE  60 mg Oral Q breakfast  . sodium chloride flush  3 mL Intravenous Q12H   Continuous Infusions: . sodium chloride 100 mL/hr at 03/22/17 0752     LOS: 2 days   Freddi Forster, Orpah Melter, MD [facility] Pager [phone]  If 7PM-7AM, please contact night-coverage [email or web address] Password [email or web address]  03/22/2017, 3:33 PM

## 2017-03-23 LAB — FERRITIN: Ferritin: 33 ng/mL (ref 11–307)

## 2017-03-23 LAB — CBC WITH DIFFERENTIAL/PLATELET
BASOS PCT: 0 %
Basophils Absolute: 0 10*3/uL (ref 0.0–0.1)
Eosinophils Absolute: 0 10*3/uL (ref 0.0–0.7)
Eosinophils Relative: 0 %
HEMATOCRIT: 33 % — AB (ref 36.0–46.0)
Hemoglobin: 11 g/dL — ABNORMAL LOW (ref 12.0–15.0)
Lymphocytes Relative: 8 %
Lymphs Abs: 0.7 10*3/uL (ref 0.7–4.0)
MCH: 25.6 pg — ABNORMAL LOW (ref 26.0–34.0)
MCHC: 33.3 g/dL (ref 30.0–36.0)
MCV: 76.9 fL — ABNORMAL LOW (ref 78.0–100.0)
MONO ABS: 0.5 10*3/uL (ref 0.1–1.0)
MONOS PCT: 7 %
NEUTROS ABS: 6.6 10*3/uL (ref 1.7–7.7)
Neutrophils Relative %: 85 %
Platelets: 176 10*3/uL (ref 150–400)
RBC: 4.29 MIL/uL (ref 3.87–5.11)
RDW: 16.1 % — AB (ref 11.5–15.5)
WBC: 7.8 10*3/uL (ref 4.0–10.5)

## 2017-03-23 LAB — CREATININE, SERUM
CREATININE: 0.78 mg/dL (ref 0.44–1.00)
GFR calc Af Amer: >60 mL/min (ref >60)

## 2017-03-23 LAB — IRON AND TIBC
Iron: 12 ug/dL — ABNORMAL LOW (ref 28–170)
Saturation Ratios: 5 % — ABNORMAL LOW (ref 10.4–31.8)
TIBC: 245 ug/dL — AB (ref 250–450)
UIBC: 233 ug/dL

## 2017-03-23 MED ORDER — SALINE SPRAY 0.65 % NA SOLN
1.0000 | NASAL | Status: DC | PRN
  Filled 2017-03-23: qty 44

## 2017-03-23 MED ORDER — FLUTICASONE PROPIONATE 50 MCG/ACT NA SUSP
1.0000 | Freq: Every day | NASAL | Status: DC
  Filled 2017-03-23: qty 16

## 2017-03-23 MED ORDER — PANTOPRAZOLE SODIUM 40 MG PO TBEC
40.0000 mg | DELAYED_RELEASE_TABLET | Freq: Every day | ORAL | 0 refills | Status: DC
Start: 2017-03-24 — End: 2018-01-31

## 2017-03-23 MED ORDER — OSELTAMIVIR PHOSPHATE 75 MG PO CAPS: 75.0000 mg | ORAL_CAPSULE | Freq: Two times a day (BID) | ORAL | 0 refills | Status: DC

## 2017-03-23 MED ORDER — AMOXICILLIN-POT CLAVULANATE 875-125 MG PO TABS: 1.0000 | ORAL_TABLET | Freq: Two times a day (BID) | ORAL | 0 refills | Status: DC

## 2017-03-23 MED ORDER — FLUCONAZOLE 100 MG PO TABS
150.0000 mg | ORAL_TABLET | Freq: Once | ORAL | Status: AC
  Administered 2017-03-23: 150 mg via ORAL
  Filled 2017-03-23: qty 2

## 2017-03-23 MED ORDER — KETOROLAC TROMETHAMINE 10 MG PO TABS: 10.0000 mg | ORAL_TABLET | Freq: Four times a day (QID) | ORAL | 0 refills | Status: DC | PRN

## 2017-03-23 MED ORDER — PREDNISONE 20 MG PO TABS: 60.0000 mg | ORAL_TABLET | Freq: Every day | ORAL | 0 refills | Status: DC

## 2017-03-23 NOTE — Discharge Summary (Signed)
Physician Discharge Summary  [name] TFT:[id number] DOB: 09-Jul-1981 DOA: 03/20/2017  PCP: Osborne Casco, MD  Admit date: 03/20/2017 Discharge date: 03/23/2017  Admitted From: Home Disposition:  Home  Recommendations for Outpatient Follow-up:  1. Follow up with PCP in 2-3 weeks 2. Follow up with Dr. Alvy Bimler as scheduled in one week  Discharge Condition:Improved CODE STATUS:Full Diet recommendation: Regular   Brief/Interim Summary: 36 y.o.femalewith medical history significant of ITP, s/p of splenomegaly, who presents with fever, vaginal bleeding and low platelet.  Pt states that she has history of ITP and was treated with IVIG March 2017. Today she states that she has fever and cough with yellow colored sputum production. Denies runny nose, sore throat, chest pain or SOB. She was seen by her PCP today and found to have platelet 25, was sent to ED for further evaluation and treatment. Patient states that she has been having vaginal bleeding in the past 3 days, initially was heavy, andnow becomes lighter. Nohematochezia, hematuria or hematemesis. Patient does not have confusion, nausea, vomiting, diarrhea, abdominal pain, symptoms of UTI or unilateral weakness. She has some mild bilateral hip pain.  ITP exacerbation: - Plts noted to be 16 on admission. Noted to have vaginal bleeding at presentation. Hematologist, Dr. Addison Bailey was consulted by EDP, who recommended to give prednisone 60 mg 1, 1 unit for plateletsand start IVIG.  - Patient seen with Dr. Alvy Bimler. Plan to continue with prednisone to be tapered as an outpatient - Plts have improved to the 170 range - Patient to follow up with Dr. Alvy Bimler  Sepsis secondary to influenza:Patient met criteria for sepsis with fever and tachypnea. Hemodynamically stable. Likely due to upper respiratory viral infection. Since patient is s/p ofsplenomegaly and immunosuppressed-->started on empiric antibiotics with broad  coverage with vanc and zosyn -Have narrowed coverage to zosyn monotherapy. Will transition to PO augmentin -Reportedly flu pos recently. Continued on tamiflu  Vaginal bleeding: - Hemoglobin 12.4, platelets and 16 at presentation Hemodynamically stable at present -Patient was given 1 unit of platelets as above  Migraine headache - Much improved with toradol  Yeast infection - Will give one dose of diflucan  Discharge Diagnoses:  Principal Problem:   Idiopathic thrombocytopenic purpura ([place]) Active Problems:   Vaginal bleeding   Sepsis ([place])   Chronic ITP (idiopathic thrombocytopenic purpura) ([facility])    Discharge Instructions   Allergies as of 03/23/2017      Reactions   Rituximab Swelling      Medication List    TAKE these medications   amoxicillin-clavulanate 875-125 MG tablet Commonly known as:  AUGMENTIN Take 1 tablet by mouth every 12 (twelve) hours.   ibuprofen 800 MG tablet Commonly known as:  ADVIL,MOTRIN Take 800 mg by mouth daily as needed for headache or cramping (only uses during her mentrual cycle.).   ketorolac 10 MG tablet Commonly known as:  TORADOL Take 1 tablet (10 mg total) by mouth every 6 (six) hours as needed.   loratadine-pseudoephedrine 10-240 MG 24 hr tablet Commonly known as:  CLARITIN-D 24-hour Take 1 tablet by mouth daily as needed for allergies.   naproxen sodium 220 MG tablet Commonly known as:  ANAPROX Take 440 mg by mouth 2 (two) times daily with a meal.   oseltamivir 75 MG capsule Commonly known as:  TAMIFLU Take 1 capsule (75 mg total) by mouth 2 (two) times daily.   pantoprazole 40 MG tablet Commonly known as:  PROTONIX Take 1 tablet (40 mg total) by mouth daily. Start taking on:  03/24/2017   predniSONE 20 MG tablet Commonly known as:  DELTASONE Take 3 tablets (60 mg total) by mouth daily with breakfast. Start taking on:  03/24/2017      Follow-up Information    GRIFFIN,[name], MD Follow up in 2 week(s).    Specialty:  Family Medicine Contact information: [address] [place] [address] [phone]        Heath Lark, MD Follow up.   Specialty:  Hematology and Oncology Why:  as scheduled Contact information: [place] [address] [phone]          Allergies  Allergen Reactions  . Rituximab Swelling    Consultations:  Hematology  Procedures/Studies: Dg Chest 2 View  Result Date: 03/20/2017 CLINICAL DATA:  Patient seen today at [facility]. She states she was diagnosed with Flu and Low platelets. She is complaining of weakness and a headache EXAM: CHEST  2 VIEW COMPARISON:  01/22/2017 FINDINGS: Midline trachea. Normal heart size and mediastinal contours. No pleural effusion or pneumothorax. Clear lungs. IMPRESSION: No acute cardiopulmonary disease. Electronically Signed   By: Abigail Miyamoto M.D.   On: 03/20/2017 18:15    Subjective: No complaints  Discharge Exam: Vitals:   03/22/17 2125 03/23/17 0539  BP: 135/86 136/83  Pulse: 78 64  Resp: 16   Temp: 99 F (37.2 C) 99 F (37.2 C)   Vitals:   03/22/17 0545 03/22/17 1413 03/22/17 2125 03/23/17 0539  BP: 127/78 133/66 135/86 136/83  Pulse: 97 88 78 64  Resp: '20 16 16   ' Temp: (!) 100.4 F (38 C) 100 F (37.8 C) 99 F (37.2 C) 99 F (37.2 C)  TempSrc: Oral Oral Oral Oral  SpO2: 94% 90% 91% 91%  Weight:      Height:        General: Pt is alert, awake, not in acute distress Cardiovascular: RRR, S1/S2 +, no rubs, no gallops Respiratory: CTA bilaterally, no wheezing, no rhonchi Abdominal: Soft, NT, ND, bowel sounds + Extremities: no edema, no cyanosis   The results of significant diagnostics from this hospitalization (including imaging, microbiology, ancillary and laboratory) are listed below for reference.     Microbiology: Recent Results (from the past 240 hour(s))  Urine culture     Status: None   Collection Time: 03/20/17  6:30 PM   Result Value Ref Range Status   Specimen Description URINE, RANDOM  Final   Special Requests NONE  Final   Culture   Final    NO GROWTH Performed at [facility], [address] [place], [place] [address]    Report Status 03/22/2017 FINAL  Final  Culture, expectorated sputum-assessment     Status: None   Collection Time: 03/20/17  8:58 PM  Result Value Ref Range Status   Specimen Description SPUTUM  Final   Special Requests Immunocompromised  Final   Sputum evaluation THIS SPECIMEN IS ACCEPTABLE FOR SPUTUM CULTURE  Final   Report Status 03/22/2017 FINAL  Final  Culture, respiratory (NON-Expectorated)     Status: None (Preliminary result)   Collection Time: 03/20/17  8:58 PM  Result Value Ref Range Status   Specimen Description SPUTUM  Final   Special Requests Immunocompromised Reflexed from [id number]  Final   Gram Stain   Final    RARE WBC PRESENT,BOTH PMN AND MONONUCLEAR RARE SQUAMOUS EPITHELIAL CELLS PRESENT NO ORGANISMS SEEN Performed at [facility], [place] [address] [place], [place] [address]  Culture PENDING  Incomplete   Report Status PENDING  Incomplete  MRSA PCR Screening     Status: None   Collection Time: 03/20/17  9:59 PM  Result Value Ref Range Status   MRSA by PCR NEGATIVE NEGATIVE Final    Comment:        The GeneXpert MRSA Assay (FDA approved for NASAL specimens only), is one component of a comprehensive MRSA colonization surveillance program. It is not intended to diagnose MRSA infection nor to guide or monitor treatment for MRSA infections.   Culture, blood (Routine X 2) w Reflex to ID Panel     Status: None (Preliminary result)   Collection Time: 03/20/17 10:14 PM  Result Value Ref Range Status   Specimen Description BLOOD LEFT ANTECUBITAL  Final   Special Requests   Final    BOTTLES DRAWN AEROBIC AND ANAEROBIC Blood Culture adequate volume   Culture   Final    NO GROWTH 2 DAYS Performed at [facility], [address]  [place], [place] [address]    Report Status PENDING  Incomplete  Culture, blood (Routine X 2) w Reflex to ID Panel     Status: None (Preliminary result)   Collection Time: 03/20/17 10:14 PM  Result Value Ref Range Status   Specimen Description BLOOD RIGHT ANTECUBITAL  Final   Special Requests   Final    BOTTLES DRAWN AEROBIC AND ANAEROBIC Blood Culture adequate volume   Culture   Final    NO GROWTH 2 DAYS Performed at [facility], [place] [address] [place],  [address]    Report Status PENDING  Incomplete     Labs: BNP (last 3 results) No results for input(s): BNP in the last 8760 hours. Basic Metabolic Panel:  Recent Labs Lab 03/20/17 1830 03/21/17 0320 03/22/17 0447 03/23/17 0442  NA 136 135 136  --   K 3.7 3.5 3.7  --   CL 103 109 107  --   CO2 '27 22 22  ' --   GLUCOSE 89 211* 111*  --   BUN 6 9 <5*  --   CREATININE 0.83 0.79 0.80 0.78  CALCIUM 8.8* 7.7* 7.7*  --    Liver Function Tests:  Recent Labs Lab 03/20/17 1830  AST 19  ALT 11*  ALKPHOS 80  BILITOT 0.7  PROT 8.0  ALBUMIN 3.7   No results for input(s): LIPASE, AMYLASE in the last 168 hours. No results for input(s): AMMONIA in the last 168 hours. CBC:  Recent Labs Lab 03/20/17 1830  03/21/17 0320 03/21/17 0843 03/21/17 1418 03/22/17 0447 03/23/17 0442  WBC 8.8  < > 6.3 8.2 10.4 12.2* 7.8  NEUTROABS 5.3  --   --   --   --   --  6.6  HGB 12.4  < > 10.3* 10.8* 11.1* 10.8* 11.0*  HCT 37.8  < > 32.7* 33.6* 34.1* 33.6* 33.0*  MCV 78.4  < > 77.9* 77.2* 78.6 77.1* 76.9*  PLT 16*  < > 67* 73* 75* 112* 176  < > = values in this interval not displayed. Cardiac Enzymes: No results for input(s): CKTOTAL, CKMB, CKMBINDEX, TROPONINI in the last 168 hours. BNP: Invalid input(s): POCBNP CBG:  Recent Labs Lab 03/21/17 1610 03/21/17 1630 03/21/17 1719  GLUCAP 62* 64* 95   D-Dimer No results for input(s): DDIMER in the last 72 hours. Hgb A1c No results for input(s): HGBA1C in the last 72  hours. Lipid Profile No results for input(s): CHOL, HDL,  LDLCALC, TRIG, CHOLHDL, LDLDIRECT in the last 72 hours. Thyroid function studies No results for input(s): TSH, T4TOTAL, T3FREE, THYROIDAB in the last 72 hours.  Invalid input(s): FREET3 Anemia work up  Recent Labs  03/23/17 0442  FERRITIN 33  TIBC 245*  IRON 12*   Urinalysis    Component Value Date/Time   COLORURINE YELLOW 03/20/2017 1830   APPEARANCEUR CLEAR 03/20/2017 1830   LABSPEC 1.018 03/20/2017 1830   LABSPEC 1.020 08/23/2006 1632   PHURINE 7.0 03/20/2017 1830   GLUCOSEU NEGATIVE 03/20/2017 1830   HGBUR NEGATIVE 03/20/2017 1830   BILIRUBINUR NEGATIVE 03/20/2017 1830   BILIRUBINUR Negative 08/23/2006 1632   KETONESUR 20 (A) 03/20/2017 1830   PROTEINUR NEGATIVE 03/20/2017 1830   NITRITE NEGATIVE 03/20/2017 1830   LEUKOCYTESUR NEGATIVE 03/20/2017 1830   LEUKOCYTESUR Negative 08/23/2006 1632   Sepsis Labs Invalid input(s): PROCALCITONIN,  WBC,  LACTICIDVEN Microbiology Recent Results (from the past 240 hour(s))  Urine culture     Status: None   Collection Time: 03/20/17  6:30 PM  Result Value Ref Range Status   Specimen Description URINE, RANDOM  Final   Special Requests NONE  Final   Culture   Final    NO GROWTH Performed at [facility], [place] [address] [place], [place] [address]    Report Status 03/22/2017 FINAL  Final  Culture, expectorated sputum-assessment     Status: None   Collection Time: 03/20/17  8:58 PM  Result Value Ref Range Status   Specimen Description SPUTUM  Final   Special Requests Immunocompromised  Final   Sputum evaluation THIS SPECIMEN IS ACCEPTABLE FOR SPUTUM CULTURE  Final   Report Status 03/22/2017 FINAL  Final  Culture, respiratory (NON-Expectorated)     Status: None (Preliminary result)   Collection Time: 03/20/17  8:58 PM  Result Value Ref Range Status   Specimen Description SPUTUM  Final   Special Requests Immunocompromised Reflexed from [id number]  Final   Gram  Stain   Final    RARE WBC PRESENT,BOTH PMN AND MONONUCLEAR RARE SQUAMOUS EPITHELIAL CELLS PRESENT NO ORGANISMS SEEN Performed at [facility], [place] [address], [place], [place] [address]    Culture PENDING  Incomplete   Report Status PENDING  Incomplete  MRSA PCR Screening     Status: None   Collection Time: 03/20/17  9:59 PM  Result Value Ref Range Status   MRSA by PCR NEGATIVE NEGATIVE Final    Comment:        The GeneXpert MRSA Assay (FDA approved for NASAL specimens only), is one component of a comprehensive MRSA colonization surveillance program. It is not intended to diagnose MRSA infection nor to guide or monitor treatment for MRSA infections.   Culture, blood (Routine X 2) w Reflex to ID Panel     Status: None (Preliminary result)   Collection Time: 03/20/17 10:14 PM  Result Value Ref Range Status   Specimen Description BLOOD LEFT ANTECUBITAL  Final   Special Requests   Final    BOTTLES DRAWN AEROBIC AND ANAEROBIC Blood Culture adequate volume   Culture   Final    NO GROWTH 2 DAYS Performed at [facility], [address], [place], [place] [address]    Report Status PENDING  Incomplete  Culture, blood (Routine X 2) w Reflex to ID Panel     Status: None (Preliminary result)   Collection Time: 03/20/17 10:14 PM  Result Value Ref Range Status   Specimen Description BLOOD RIGHT ANTECUBITAL  Final   Special Requests   Final    BOTTLES DRAWN AEROBIC AND ANAEROBIC Blood Culture adequate volume   Culture   Final    NO GROWTH 2 DAYS Performed at [facility], [address] [place], [place] [address]    Report Status PENDING  Incomplete     SIGNED:   Devi Hopman, Orpah Melter, MD  [facility] 03/23/2017, 2:57 PM  If 7PM-7AM, please contact night-coverage [email or web address] Password [email or web address]

## 2017-03-24 ENCOUNTER — Telehealth: Payer: Self-pay | Admitting: Hematology and Oncology

## 2017-03-24 NOTE — Telephone Encounter (Signed)
Spoke with patient re 5/2 lab/fu

## 2017-03-25 LAB — CULTURE, RESPIRATORY: CULTURE: NORMAL

## 2017-03-25 LAB — CULTURE, RESPIRATORY W GRAM STAIN

## 2017-03-26 LAB — CULTURE, BLOOD (ROUTINE X 2)
CULTURE: NO GROWTH
CULTURE: NO GROWTH
SPECIAL REQUESTS: ADEQUATE
Special Requests: ADEQUATE

## 2017-03-27 ENCOUNTER — Other Ambulatory Visit: Payer: Self-pay | Admitting: Hematology and Oncology

## 2017-03-27 DIAGNOSIS — D693 Immune thrombocytopenic purpura: Secondary | ICD-10-CM

## 2017-03-28 ENCOUNTER — Telehealth: Payer: Self-pay | Admitting: Hematology and Oncology

## 2017-03-28 ENCOUNTER — Ambulatory Visit (HOSPITAL_BASED_OUTPATIENT_CLINIC_OR_DEPARTMENT_OTHER): Payer: BLUE CROSS/BLUE SHIELD | Admitting: Hematology and Oncology

## 2017-03-28 ENCOUNTER — Encounter: Payer: Self-pay | Admitting: Hematology and Oncology

## 2017-03-28 ENCOUNTER — Other Ambulatory Visit (HOSPITAL_BASED_OUTPATIENT_CLINIC_OR_DEPARTMENT_OTHER): Payer: BLUE CROSS/BLUE SHIELD

## 2017-03-28 DIAGNOSIS — D693 Immune thrombocytopenic purpura: Secondary | ICD-10-CM

## 2017-03-28 DIAGNOSIS — D509 Iron deficiency anemia, unspecified: Secondary | ICD-10-CM | POA: Diagnosis not present

## 2017-03-28 DIAGNOSIS — L409 Psoriasis, unspecified: Secondary | ICD-10-CM | POA: Diagnosis not present

## 2017-03-28 LAB — CBC WITH DIFFERENTIAL/PLATELET
BASO%: 0.3 % (ref 0.0–2.0)
Basophils Absolute: 0 10*3/uL (ref 0.0–0.1)
EOS%: 1.8 % (ref 0.0–7.0)
Eosinophils Absolute: 0.2 10*3/uL (ref 0.0–0.5)
HCT: 35.6 % (ref 34.8–46.6)
HEMOGLOBIN: 11.7 g/dL (ref 11.6–15.9)
LYMPH#: 3.4 10*3/uL — AB (ref 0.9–3.3)
LYMPH%: 34.9 % (ref 14.0–49.7)
MCH: 25.6 pg (ref 25.1–34.0)
MCHC: 32.9 g/dL (ref 31.5–36.0)
MCV: 77.9 fL — ABNORMAL LOW (ref 79.5–101.0)
MONO#: 1 10*3/uL — ABNORMAL HIGH (ref 0.1–0.9)
MONO%: 10.6 % (ref 0.0–14.0)
NEUT#: 5.1 10*3/uL (ref 1.5–6.5)
NEUT%: 52.4 % (ref 38.4–76.8)
Platelets: 645 10*3/uL — ABNORMAL HIGH (ref 145–400)
RBC: 4.57 10*6/uL (ref 3.70–5.45)
RDW: 15.8 % — ABNORMAL HIGH (ref 11.2–14.5)
WBC: 9.8 10*3/uL (ref 3.9–10.3)
nRBC: 0 % (ref 0–0)

## 2017-03-28 NOTE — Telephone Encounter (Signed)
Gave patient avs report and appointments for May  °

## 2017-03-29 ENCOUNTER — Encounter: Payer: Self-pay | Admitting: Hematology and Oncology

## 2017-03-29 NOTE — Progress Notes (Signed)
[facility] OFFICE PROGRESS NOTE  Astrid DivineGRIFFIN,[name], MD SUMMARY OF HEMATOLOGIC HISTORY:  This is a very pleasant lady with history of chronic ITP. 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 She responded well to prednisone but due to the excessive weight gain and other side effects, prefer not to be treated with prednisone. When she was treated with rituximab before 2010, she did develop significant allergic reaction. Her last treatment with IVIG was in 2011. Currently she's been placed on observation She was admitted to the hospital from 01/26/2016 to 01/28/2016 with recurrence of ITP. She received IVIG and high-dose dexamethasone with improvement of her platelet count The patient had relapse of ITP, hospitalized from 03/20/2017 to 03/23/2017 with IVIG and steroids treatment.  This episode was triggered by stress and recent influenza infection further follow-up.  INTERVAL HISTORY: [name] 36 y.o. female returns for further follow-up.  She tolerated recent prednisone therapy well She is undergoing a lot of stress lately regarding to plan for adoption  The patient denies any recent signs or symptoms of bleeding such as spontaneous epistaxis, hematuria or hematochezia. She denies recent fever or chills.  She is out of work. She continues to have mild cough but improved I have reviewed the past medical history, past surgical history, social history and family history with the patient and they are unchanged from previous note.  ALLERGIES:  is allergic to rituximab.  MEDICATIONS:  Current Outpatient Prescriptions  Medication Sig Dispense Refill  . ibuprofen (ADVIL,MOTRIN) 800 MG tablet Take 800 mg by mouth daily as needed for headache or cramping (only uses during her mentrual cycle.).     Marland Kitchen. ketorolac (TORADOL) 10 MG tablet Take 1 tablet (10 mg total) by mouth every 6 (six) hours as needed. 20 tablet 0  .  loratadine-pseudoephedrine (CLARITIN-D 24-HOUR) 10-240 MG 24 hr tablet Take 1 tablet by mouth daily as needed for allergies.    . naproxen sodium (ANAPROX) 220 MG tablet Take 440 mg by mouth 2 (two) times daily with a meal.    . predniSONE (DELTASONE) 20 MG tablet Take 3 tablets (60 mg total) by mouth daily with breakfast. 14 tablet 0  . clobetasol (TEMOVATE) 0.05 % external solution   0  . pantoprazole (PROTONIX) 40 MG tablet Take 1 tablet (40 mg total) by mouth daily. (Patient not taking: Reported on 03/28/2017) 30 tablet 0   No current facility-administered medications for this visit.      REVIEW OF SYSTEMS:   Constitutional: Denies fevers, chills or night sweats Eyes: Denies blurriness of vision Ears, nose, mouth, throat, and face: Denies mucositis or sore throat Respiratory: Denies cough, dyspnea or wheezes Cardiovascular: Denies palpitation, chest discomfort or lower extremity swelling Gastrointestinal:  Denies nausea, heartburn or change in bowel habits Skin: Denies abnormal skin rashes Lymphatics: Denies new lymphadenopathy or easy bruising Neurological:Denies numbness, tingling or new weaknesses Behavioral/Psych: Mood is stable, no new changes  All other systems were reviewed with the patient and are negative.  PHYSICAL EXAMINATION: ECOG PERFORMANCE STATUS: 0 - Asymptomatic  Vitals:   03/28/17 1004  BP: 124/72  Pulse: 80  Resp: 20  Temp: 98.5 F (36.9 C)   Filed Weights   03/28/17 1004  Weight: 197 lb 12.8 oz (89.7 kg)    GENERAL:alert, no distress and comfortable SKIN: skin color, texture, turgor are normal, no rashes or significant lesions EYES: normal, Conjunctiva are pink and non-injected, sclera clear OROPHARYNX:no exudate, no erythema and lips,  buccal mucosa, and tongue normal  NECK: supple, thyroid normal size, non-tender, without nodularity LYMPH:  no palpable lymphadenopathy in the cervical, axillary or inguinal LUNGS: clear to auscultation and percussion  with normal breathing effort HEART: regular rate & rhythm and no murmurs and no lower extremity edema ABDOMEN:abdomen soft, non-tender and normal bowel sounds Musculoskeletal:no cyanosis of digits and no clubbing  NEURO: alert & oriented x 3 with fluent speech, no focal motor/sensory deficits  LABORATORY DATA:  I have reviewed the data as listed     Component Value Date/Time   NA 136 03/22/2017 0447   NA 139 08/27/2013 1002   K 3.7 03/22/2017 0447   K 3.8 08/27/2013 1002   CL 107 03/22/2017 0447   CL 105 12/12/2012 1545   CO2 22 03/22/2017 0447   CO2 26 08/27/2013 1002   GLUCOSE 111 (H) 03/22/2017 0447   GLUCOSE 79 08/27/2013 1002   GLUCOSE 91 12/12/2012 1545   BUN <5 (L) 03/22/2017 0447   BUN 8.1 08/27/2013 1002   CREATININE 0.78 03/23/2017 0442   CREATININE 0.8 08/27/2013 1002   CALCIUM 7.7 (L) 03/22/2017 0447   CALCIUM 9.4 08/27/2013 1002   PROT 8.0 03/20/2017 1830   PROT 7.8 08/27/2013 1002   ALBUMIN 3.7 03/20/2017 1830   ALBUMIN 3.6 08/27/2013 1002   AST 19 03/20/2017 1830   AST 15 08/27/2013 1002   ALT 11 (L) 03/20/2017 1830   ALT 6 08/27/2013 1002   ALKPHOS 80 03/20/2017 1830   ALKPHOS 77 08/27/2013 1002   BILITOT 0.7 03/20/2017 1830   BILITOT 0.62 08/27/2013 1002   GFRNONAA >60 03/23/2017 0442   GFRAA >60 03/23/2017 0442    No results found for: SPEP, UPEP  Lab Results  Component Value Date   WBC 9.8 03/28/2017   NEUTROABS 5.1 03/28/2017   HGB 11.7 03/28/2017   HCT 35.6 03/28/2017   MCV 77.9 (L) 03/28/2017   PLT 645 (H) 03/28/2017      Chemistry      Component Value Date/Time   NA 136 03/22/2017 0447   NA 139 08/27/2013 1002   K 3.7 03/22/2017 0447   K 3.8 08/27/2013 1002   CL 107 03/22/2017 0447   CL 105 12/12/2012 1545   CO2 22 03/22/2017 0447   CO2 26 08/27/2013 1002   BUN <5 (L) 03/22/2017 0447   BUN 8.1 08/27/2013 1002   CREATININE 0.78 03/23/2017 0442   CREATININE 0.8 08/27/2013 1002      Component Value Date/Time   CALCIUM 7.7  (L) 03/22/2017 0447   CALCIUM 9.4 08/27/2013 1002   ALKPHOS 80 03/20/2017 1830   ALKPHOS 77 08/27/2013 1002   AST 19 03/20/2017 1830   AST 15 08/27/2013 1002   ALT 11 (L) 03/20/2017 1830   ALT 6 08/27/2013 1002   BILITOT 0.7 03/20/2017 1830   BILITOT 0.62 08/27/2013 1002      ASSESSMENT & PLAN:  Idiopathic thrombocytopenic purpura (HCC) She has relapse of ITP recently, likely triggered by infection and stress I recommend rapid prednisone taper I plan to see her back in 2 weeks to review test results She is given specific written instruction regarding prednisone taper  Iron deficiency anemia She has recent mild iron deficiency anemia, improving We discussed prenatal vitamin  Scalp psoriasis She has intermittent flare of psoriasis on her scalp which could be related and is associated with ITP. She doesn't need further treatment right now. Continue conservative management with topical cream as needed   No orders of  the defined types were placed in this encounter.  I spent some time  helping her dictate letter for her to return back to work All questions were answered. The patient knows to call the clinic with any problems, questions or concerns. No barriers to learning was detected.  I spent 15 minutes counseling the patient face to face. The total time spent in the appointment was 20 minutes and more than 50% was on counseling.     Artis Delay, MD 5/3/20186:32 AM

## 2017-03-29 NOTE — Assessment & Plan Note (Signed)
She has recent mild iron deficiency anemia, improving We discussed prenatal vitamin

## 2017-03-29 NOTE — Assessment & Plan Note (Signed)
She has relapse of ITP recently, likely triggered by infection and stress I recommend rapid prednisone taper I plan to see her back in 2 weeks to review test results She is given specific written instruction regarding prednisone taper

## 2017-03-29 NOTE — Assessment & Plan Note (Signed)
She has intermittent flare of psoriasis on her scalp which could be related and is associated with ITP. She doesn't need further treatment right now. Continue conservative management with topical cream as needed

## 2017-04-02 DIAGNOSIS — J111 Influenza due to unidentified influenza virus with other respiratory manifestations: Secondary | ICD-10-CM | POA: Diagnosis not present

## 2017-04-11 ENCOUNTER — Encounter: Payer: Self-pay | Admitting: Hematology and Oncology

## 2017-04-11 ENCOUNTER — Other Ambulatory Visit (HOSPITAL_BASED_OUTPATIENT_CLINIC_OR_DEPARTMENT_OTHER): Payer: BLUE CROSS/BLUE SHIELD

## 2017-04-11 ENCOUNTER — Ambulatory Visit (HOSPITAL_BASED_OUTPATIENT_CLINIC_OR_DEPARTMENT_OTHER): Payer: BLUE CROSS/BLUE SHIELD | Admitting: Hematology and Oncology

## 2017-04-11 DIAGNOSIS — D693 Immune thrombocytopenic purpura: Secondary | ICD-10-CM

## 2017-04-11 DIAGNOSIS — L409 Psoriasis, unspecified: Secondary | ICD-10-CM | POA: Diagnosis not present

## 2017-04-11 DIAGNOSIS — D509 Iron deficiency anemia, unspecified: Secondary | ICD-10-CM

## 2017-04-11 LAB — CBC WITH DIFFERENTIAL/PLATELET
BASO%: 0.9 % (ref 0.0–2.0)
Basophils Absolute: 0.1 10*3/uL (ref 0.0–0.1)
EOS%: 2.2 % (ref 0.0–7.0)
Eosinophils Absolute: 0.1 10*3/uL (ref 0.0–0.5)
HEMATOCRIT: 39.1 % (ref 34.8–46.6)
HEMOGLOBIN: 12.6 g/dL (ref 11.6–15.9)
LYMPH#: 2 10*3/uL (ref 0.9–3.3)
LYMPH%: 37.9 % (ref 14.0–49.7)
MCH: 25.7 pg (ref 25.1–34.0)
MCHC: 32.2 g/dL (ref 31.5–36.0)
MCV: 79.6 fL (ref 79.5–101.0)
MONO#: 0.7 10*3/uL (ref 0.1–0.9)
MONO%: 12.5 % (ref 0.0–14.0)
NEUT%: 46.5 % (ref 38.4–76.8)
NEUTROS ABS: 2.5 10*3/uL (ref 1.5–6.5)
NRBC: 0 % (ref 0–0)
Platelets: 135 10*3/uL — ABNORMAL LOW (ref 145–400)
RBC: 4.91 10*6/uL (ref 3.70–5.45)
RDW: 15.7 % — ABNORMAL HIGH (ref 11.2–14.5)
WBC: 5.4 10*3/uL (ref 3.9–10.3)

## 2017-04-11 NOTE — Progress Notes (Signed)
[facility] OFFICE PROGRESS NOTE  Maurice SmallGriffin, Elaine, MD SUMMARY OF HEMATOLOGIC HISTORY:  This is a very pleasant lady with history of chronic ITP. 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 She responded well to prednisone but due to the excessive weight gain and other side effects, prefer not to be treated with prednisone. When she was treated with rituximab before 2010, she did develop significant allergic reaction. Her last treatment with IVIG was in 2011. Currently she's been placed on observation She was admitted to the hospital from 01/26/2016 to 01/28/2016 with recurrence of ITP. She received IVIG and high-dose dexamethasone with improvement of her platelet count The patient had relapse of ITP, hospitalized from 03/20/2017 to 03/23/2017 with IVIG and steroids treatment.  This episode was triggered by stress and recent influenza infection further follow-up. INTERVAL HISTORY: [name] 36 y.o. female returns for further follow-up. She felt much better. She is back to her usual self. Appetite is stable. She tolerated recent prednisone taper well The patient denies any recent signs or symptoms of bleeding such as spontaneous epistaxis, hematuria or hematochezia.   I have reviewed the past medical history, past surgical history, social history and family history with the patient and they are unchanged from previous note.  ALLERGIES:  is allergic to rituximab.  MEDICATIONS:  Current Outpatient Prescriptions  Medication Sig Dispense Refill  . clobetasol (TEMOVATE) 0.05 % external solution   0  . ibuprofen (ADVIL,MOTRIN) 800 MG tablet Take 800 mg by mouth daily as needed for headache or cramping (only uses during her mentrual cycle.).     Marland Kitchen. ketorolac (TORADOL) 10 MG tablet Take 1 tablet (10 mg total) by mouth every 6 (six) hours as needed. 20 tablet 0  . loratadine-pseudoephedrine (CLARITIN-D 24-HOUR) 10-240 MG 24 hr tablet Take 1  tablet by mouth daily as needed for allergies.    . pantoprazole (PROTONIX) 40 MG tablet Take 1 tablet (40 mg total) by mouth daily. (Patient not taking: Reported on 03/28/2017) 30 tablet 0   No current facility-administered medications for this visit.      REVIEW OF SYSTEMS:   Constitutional: Denies fevers, chills or night sweats Eyes: Denies blurriness of vision Ears, nose, mouth, throat, and face: Denies mucositis or sore throat Respiratory: Denies cough, dyspnea or wheezes Cardiovascular: Denies palpitation, chest discomfort or lower extremity swelling Gastrointestinal:  Denies nausea, heartburn or change in bowel habits Skin: Denies abnormal skin rashes Lymphatics: Denies new lymphadenopathy or easy bruising Neurological:Denies numbness, tingling or new weaknesses Behavioral/Psych: Mood is stable, no new changes  All other systems were reviewed with the patient and are negative.  PHYSICAL EXAMINATION: ECOG PERFORMANCE STATUS: 0 - Asymptomatic  Vitals:   04/11/17 0833  BP: 101/67  Pulse: 97  Resp: 18  Temp: 98.4 F (36.9 C)   Filed Weights   04/11/17 0833  Weight: 189 lb 6.4 oz (85.9 kg)    GENERAL:alert, no distress and comfortable SKIN: skin color, texture, turgor are normal, no rashes or significant lesions EYES: normal, Conjunctiva are pink and non-injected, sclera clear Musculoskeletal:no cyanosis of digits and no clubbing  NEURO: alert & oriented x 3 with fluent speech, no focal motor/sensory deficits  LABORATORY DATA:  I have reviewed the data as listed     Component Value Date/Time   NA 136 03/22/2017 0447   NA 139 08/27/2013 1002   K 3.7 03/22/2017 0447   K 3.8 08/27/2013 1002   CL 107 03/22/2017 0447  CL 105 12/12/2012 1545   CO2 22 03/22/2017 0447   CO2 26 08/27/2013 1002   GLUCOSE 111 (H) 03/22/2017 0447   GLUCOSE 79 08/27/2013 1002   GLUCOSE 91 12/12/2012 1545   BUN <5 (L) 03/22/2017 0447   BUN 8.1 08/27/2013 1002   CREATININE 0.78 03/23/2017  0442   CREATININE 0.8 08/27/2013 1002   CALCIUM 7.7 (L) 03/22/2017 0447   CALCIUM 9.4 08/27/2013 1002   PROT 8.0 03/20/2017 1830   PROT 7.8 08/27/2013 1002   ALBUMIN 3.7 03/20/2017 1830   ALBUMIN 3.6 08/27/2013 1002   AST 19 03/20/2017 1830   AST 15 08/27/2013 1002   ALT 11 (L) 03/20/2017 1830   ALT 6 08/27/2013 1002   ALKPHOS 80 03/20/2017 1830   ALKPHOS 77 08/27/2013 1002   BILITOT 0.7 03/20/2017 1830   BILITOT 0.62 08/27/2013 1002   GFRNONAA >60 03/23/2017 0442   GFRAA >60 03/23/2017 0442    No results found for: SPEP, UPEP  Lab Results  Component Value Date   WBC 5.4 04/11/2017   NEUTROABS 2.5 04/11/2017   HGB 12.6 04/11/2017   HCT 39.1 04/11/2017   MCV 79.6 04/11/2017   PLT 135 (L) 04/11/2017      Chemistry      Component Value Date/Time   NA 136 03/22/2017 0447   NA 139 08/27/2013 1002   K 3.7 03/22/2017 0447   K 3.8 08/27/2013 1002   CL 107 03/22/2017 0447   CL 105 12/12/2012 1545   CO2 22 03/22/2017 0447   CO2 26 08/27/2013 1002   BUN <5 (L) 03/22/2017 0447   BUN 8.1 08/27/2013 1002   CREATININE 0.78 03/23/2017 0442   CREATININE 0.8 08/27/2013 1002      Component Value Date/Time   CALCIUM 7.7 (L) 03/22/2017 0447   CALCIUM 9.4 08/27/2013 1002   ALKPHOS 80 03/20/2017 1830   ALKPHOS 77 08/27/2013 1002   AST 19 03/20/2017 1830   AST 15 08/27/2013 1002   ALT 11 (L) 03/20/2017 1830   ALT 6 08/27/2013 1002   BILITOT 0.7 03/20/2017 1830   BILITOT 0.62 08/27/2013 1002      ASSESSMENT & PLAN:  Idiopathic thrombocytopenic purpura (HCC) She has relapse of ITP recently, likely triggered by infection and stress I recommend rapid prednisone taper She has stop her prednisone and is still doing well.  Platelet count is stable. The patient understood about risk of relapse in the future. She is comfortable to continue follow-up with her primary care doctor on the regular basis I will see her back in the near future should she have signs or symptoms of  relapse.  She agree with the plan.  Scalp psoriasis She has intermittent flare of psoriasis on her scalp which could be related and is associated with ITP. She doesn't need further treatment right now. Continue conservative management with topical cream as needed  Iron deficiency anemia She has recent mild iron deficiency anemia, improving Her anemia has resolved.  MCV is improving We discussed prenatal vitamin indefinitely.   No orders of the defined types were placed in this encounter.   All questions were answered. The patient knows to call the clinic with any problems, questions or concerns. No barriers to learning was detected.  I spent 10 minutes counseling the patient face to face. The total time spent in the appointment was 15 minutes and more than 50% was on counseling.     Artis Delay, MD 5/16/20189:14 AM

## 2017-04-11 NOTE — Assessment & Plan Note (Signed)
She has recent mild iron deficiency anemia, improving Her anemia has resolved.  MCV is improving We discussed prenatal vitamin indefinitely.

## 2017-04-11 NOTE — Assessment & Plan Note (Signed)
She has intermittent flare of psoriasis on her scalp which could be related and is associated with ITP. She doesn't need further treatment right now. Continue conservative management with topical cream as needed 

## 2017-04-11 NOTE — Assessment & Plan Note (Signed)
She has relapse of ITP recently, likely triggered by infection and stress I recommend rapid prednisone taper She has stop her prednisone and is still doing well.  Platelet count is stable. The patient understood about risk of relapse in the future. She is comfortable to continue follow-up with her primary care doctor on the regular basis I will see her back in the near future should she have signs or symptoms of relapse.  She agree with the plan.

## 2017-09-25 DIAGNOSIS — Z23 Encounter for immunization: Secondary | ICD-10-CM | POA: Diagnosis not present

## 2017-09-25 DIAGNOSIS — Z01419 Encounter for gynecological examination (general) (routine) without abnormal findings: Secondary | ICD-10-CM | POA: Diagnosis not present

## 2018-01-29 ENCOUNTER — Other Ambulatory Visit: Payer: Self-pay

## 2018-01-29 ENCOUNTER — Encounter (HOSPITAL_COMMUNITY): Payer: Self-pay | Admitting: Emergency Medicine

## 2018-01-29 ENCOUNTER — Inpatient Hospital Stay (HOSPITAL_COMMUNITY)
Admission: EM | Admit: 2018-01-29 | Discharge: 2018-01-31 | DRG: 813 | Disposition: A | Discharge status: Home / Self Care | Payer: BLUE CROSS/BLUE SHIELD | Attending: Internal Medicine | Admitting: Internal Medicine

## 2018-01-29 DIAGNOSIS — D693 Immune thrombocytopenic purpura: Secondary | ICD-10-CM | POA: Diagnosis not present

## 2018-01-29 DIAGNOSIS — D72829 Elevated white blood cell count, unspecified: Secondary | ICD-10-CM | POA: Diagnosis present

## 2018-01-29 DIAGNOSIS — R519 Headache, unspecified: Secondary | ICD-10-CM

## 2018-01-29 DIAGNOSIS — D649 Anemia, unspecified: Secondary | ICD-10-CM | POA: Diagnosis not present

## 2018-01-29 DIAGNOSIS — T380X5A Adverse effect of glucocorticoids and synthetic analogues, initial encounter: Secondary | ICD-10-CM | POA: Diagnosis not present

## 2018-01-29 DIAGNOSIS — Z9081 Acquired absence of spleen: Secondary | ICD-10-CM | POA: Diagnosis not present

## 2018-01-29 DIAGNOSIS — D62 Acute posthemorrhagic anemia: Secondary | ICD-10-CM | POA: Diagnosis present

## 2018-01-29 DIAGNOSIS — N920 Excessive and frequent menstruation with regular cycle: Secondary | ICD-10-CM | POA: Diagnosis present

## 2018-01-29 DIAGNOSIS — R233 Spontaneous ecchymoses: Secondary | ICD-10-CM | POA: Diagnosis not present

## 2018-01-29 DIAGNOSIS — D509 Iron deficiency anemia, unspecified: Secondary | ICD-10-CM | POA: Diagnosis present

## 2018-01-29 DIAGNOSIS — B349 Viral infection, unspecified: Secondary | ICD-10-CM | POA: Diagnosis present

## 2018-01-29 DIAGNOSIS — D696 Thrombocytopenia, unspecified: Secondary | ICD-10-CM | POA: Diagnosis present

## 2018-01-29 DIAGNOSIS — R5383 Other fatigue: Secondary | ICD-10-CM | POA: Diagnosis not present

## 2018-01-29 DIAGNOSIS — N938 Other specified abnormal uterine and vaginal bleeding: Secondary | ICD-10-CM | POA: Diagnosis not present

## 2018-01-29 DIAGNOSIS — R51 Headache: Secondary | ICD-10-CM | POA: Diagnosis not present

## 2018-01-29 LAB — CBC WITH DIFFERENTIAL/PLATELET
BASOS ABS: 0 10*3/uL (ref 0.0–0.1)
Basophils Relative: 0 %
EOS ABS: 0.1 10*3/uL (ref 0.0–0.7)
Eosinophils Relative: 1 %
HCT: 39.8 % (ref 36.0–46.0)
HEMOGLOBIN: 12.8 g/dL (ref 12.0–15.0)
Lymphocytes Relative: 28 %
Lymphs Abs: 2.7 10*3/uL (ref 0.7–4.0)
MCH: 25.9 pg — ABNORMAL LOW (ref 26.0–34.0)
MCHC: 32.2 g/dL (ref 30.0–36.0)
MCV: 80.6 fL (ref 78.0–100.0)
Monocytes Absolute: 1 10*3/uL (ref 0.1–1.0)
Monocytes Relative: 10 %
NEUTROS PCT: 61 %
Neutro Abs: 6 10*3/uL (ref 1.7–7.7)
Platelets: 26 10*3/uL — CL (ref 150–400)
RBC: 4.94 MIL/uL (ref 3.87–5.11)
RDW: 15.3 % (ref 11.5–15.5)
WBC: 9.8 10*3/uL (ref 4.0–10.5)

## 2018-01-29 LAB — COMPREHENSIVE METABOLIC PANEL
ALK PHOS: 86 U/L (ref 38–126)
ALT: 14 U/L (ref 14–54)
AST: 20 U/L (ref 15–41)
Albumin: 3.9 g/dL (ref 3.5–5.0)
Anion gap: 8 (ref 5–15)
BUN: 12 mg/dL (ref 6–20)
CALCIUM: 9.4 mg/dL (ref 8.9–10.3)
CHLORIDE: 104 mmol/L (ref 101–111)
CO2: 28 mmol/L (ref 22–32)
Creatinine, Ser: 0.85 mg/dL (ref 0.44–1.00)
GFR calc Af Amer: >60 mL/min (ref >60)
GFR calc non Af Amer: >60 mL/min (ref >60)
Glucose, Bld: 99 mg/dL (ref 65–99)
Potassium: 3.9 mmol/L (ref 3.5–5.1)
SODIUM: 140 mmol/L (ref 135–145)
Total Bilirubin: 0.6 mg/dL (ref 0.3–1.2)
Total Protein: 8.2 g/dL — ABNORMAL HIGH (ref 6.5–8.1)

## 2018-01-29 LAB — I-STAT CHEM 8, ED
BUN: 11 mg/dL (ref 6–20)
CHLORIDE: 102 mmol/L (ref 101–111)
Calcium, Ion: 1.21 mmol/L (ref 1.15–1.40)
Creatinine, Ser: 0.9 mg/dL (ref 0.44–1.00)
Glucose, Bld: 94 mg/dL (ref 65–99)
HEMATOCRIT: 43 % (ref 36.0–46.0)
Hemoglobin: 14.6 g/dL (ref 12.0–15.0)
Potassium: 3.8 mmol/L (ref 3.5–5.1)
SODIUM: 141 mmol/L (ref 135–145)
TCO2: 29 mmol/L (ref 22–32)

## 2018-01-29 LAB — TYPE AND SCREEN
ABO/RH(D): O POS
Antibody Screen: NEGATIVE

## 2018-01-29 LAB — PROTIME-INR
INR: 0.97
Prothrombin Time: 12.8 seconds (ref 11.4–15.2)

## 2018-01-29 LAB — I-STAT BETA HCG BLOOD, ED (MC, WL, AP ONLY)

## 2018-01-29 MED ORDER — POLYETHYLENE GLYCOL 3350 17 G PO PACK: 17.0000 g | PACK | Freq: Every day | ORAL | Status: DC | PRN

## 2018-01-29 MED ORDER — ONDANSETRON HCL 4 MG PO TABS
4.0000 mg | ORAL_TABLET | Freq: Four times a day (QID) | ORAL | Status: DC | PRN
  Administered 2018-01-31: 4 mg via ORAL
  Filled 2018-01-29: qty 1

## 2018-01-29 MED ORDER — PANTOPRAZOLE SODIUM 40 MG PO TBEC
40.0000 mg | DELAYED_RELEASE_TABLET | Freq: Every day | ORAL | Status: DC
  Administered 2018-01-29 – 2018-01-31 (×3): 40 mg via ORAL
  Filled 2018-01-29 (×3): qty 1

## 2018-01-29 MED ORDER — ONDANSETRON HCL 4 MG/2ML IJ SOLN: 4.0000 mg | Freq: Four times a day (QID) | INTRAMUSCULAR | Status: DC | PRN

## 2018-01-29 MED ORDER — PREDNISONE 20 MG PO TABS
90.0000 mg | ORAL_TABLET | Freq: Every day | ORAL | Status: DC
  Administered 2018-01-30 – 2018-01-31 (×2): 90 mg via ORAL
  Filled 2018-01-29 (×2): qty 1

## 2018-01-29 MED ORDER — IMMUNE GLOBULIN (HUMAN) 10 GM/100ML IV SOLN
1.0000 g/kg | INTRAVENOUS | Status: AC
  Administered 2018-01-29 – 2018-01-30 (×2): 90 g via INTRAVENOUS
  Filled 2018-01-29: qty 800
  Filled 2018-01-29: qty 900

## 2018-01-29 MED ORDER — PREDNISONE 50 MG PO TABS
90.0000 mg | ORAL_TABLET | Freq: Once | ORAL | Status: AC
  Administered 2018-01-29: 90 mg via ORAL
  Filled 2018-01-29: qty 1

## 2018-01-29 MED ORDER — ZOLPIDEM TARTRATE 5 MG PO TABS
5.0000 mg | ORAL_TABLET | Freq: Once | ORAL | Status: AC
  Administered 2018-01-29: 5 mg via ORAL
  Filled 2018-01-29: qty 1

## 2018-01-29 NOTE — ED Notes (Signed)
ED TO INPATIENT HANDOFF REPORT  Name/Age/Gender [name] 37 y.o. female  Code Status Code Status History    Date Active Date Inactive Code Status Order ID Comments User Context   03/20/2017 20:12 03/23/2017 17:02 Full Code [id number]  Ivor Costa, MD ED   01/26/2016 17:05 01/28/2016 16:50 Full Code [id number]  Orson Eva, MD Inpatient      Home/SNF/Other Home  Chief Complaint ITP  Level of Care/Admitting Diagnosis ED Disposition    ED Disposition Condition [place]: [facility] [[id number]]  Level of Care: Telemetry [5]  Admit to tele based on following criteria: Other see comments  Comments: Tachycardia  Diagnosis: Thrombocytopenia [facility]) [[id number]]  Admitting Physician: Kara Pacer  Attending Physician: Bethena Roys [phone]  Estimated length of stay: past midnight tomorrow  Certification:: I certify this patient will need inpatient services for at least 2 midnights  PT Class (Do Not Modify): Inpatient [101]  PT Acc Code (Do Not Modify): Private [1]       Medical History Past Medical History:  Diagnosis Date  . Fatigue 08/27/2013  . ITP (idiopathic thrombocytopenic purpura)   . Need for prophylactic vaccination and inoculation against influenza 08/27/2013    Allergies Allergies  Allergen Reactions  . Rituximab Swelling    IV Location/Drains/Wounds Patient Lines/Drains/Airways Status   Active Line/Drains/Airways    Name:   Placement date:   Placement time:   Site:   Days:   Peripheral IV 03/20/17 Right Hand   03/20/17    2000    Hand   315   Peripheral IV 03/20/17 Left Wrist   03/20/17    2000    Wrist   315   Peripheral IV 01/29/18 Right Antecubital   01/29/18    1736    Antecubital   less than 1          Labs/Imaging Results for orders placed or performed during the hospital encounter of 01/29/18 (from the past 48 hour(s))  CBC with Differential     Status: Abnormal   Collection Time:  01/29/18  5:36 PM  Result Value Ref Range   WBC 9.8 4.0 - 10.5 K/uL   RBC 4.94 3.87 - 5.11 MIL/uL   Hemoglobin 12.8 12.0 - 15.0 g/dL   HCT 39.8 36.0 - 46.0 %   MCV 80.6 78.0 - 100.0 fL   MCH 25.9 (L) 26.0 - 34.0 pg   MCHC 32.2 30.0 - 36.0 g/dL   RDW 15.3 11.5 - 15.5 %   Platelets 26 (LL) 150 - 400 K/uL    Comment: PLATELET COUNT CONFIRMED BY SMEAR SPECIMEN CHECKED FOR CLOTS CRITICAL RESULT CALLED TO, READ BACK BY AND VERIFIED WITH: C.GULETA RN AT 1810 ON 01/29/18 BY S.VANHOORNE    Neutrophils Relative % 61 %   Neutro Abs 6.0 1.7 - 7.7 K/uL   Lymphocytes Relative 28 %   Lymphs Abs 2.7 0.7 - 4.0 K/uL   Monocytes Relative 10 %   Monocytes Absolute 1.0 0.1 - 1.0 K/uL   Eosinophils Relative 1 %   Eosinophils Absolute 0.1 0.0 - 0.7 K/uL   Basophils Relative 0 %   Basophils Absolute 0.0 0.0 - 0.1 K/uL    Comment: Performed at [facility], [place] [address], [place], [place] [address]  Comprehensive metabolic panel     Status: Abnormal   Collection Time: 01/29/18  5:36 PM  Result Value Ref Range   Sodium 140 135 - 145 mmol/L  Potassium 3.9 3.5 - 5.1 mmol/L   Chloride 104 101 - 111 mmol/L   CO2 28 22 - 32 mmol/L   Glucose, Bld 99 65 - 99 mg/dL   BUN 12 6 - 20 mg/dL   Creatinine, Ser 0.85 0.44 - 1.00 mg/dL   Calcium 9.4 8.9 - 10.3 mg/dL   Total Protein 8.2 (H) 6.5 - 8.1 g/dL   Albumin 3.9 3.5 - 5.0 g/dL   AST 20 15 - 41 U/L   ALT 14 14 - 54 U/L   Alkaline Phosphatase 86 38 - 126 U/L   Total Bilirubin 0.6 0.3 - 1.2 mg/dL   GFR calc non Af Amer >60 >60 mL/min   GFR calc Af Amer >60 >60 mL/min    Comment: (NOTE) The eGFR has been calculated using the CKD EPI equation. This calculation has not been validated in all clinical situations. eGFR's persistently <60 mL/min signify possible Chronic Kidney Disease.    Anion gap 8 5 - 15    Comment: Performed at [facility], [place] [address] [place], [place] [address]  Protime-INR     Status: None    Collection Time: 01/29/18  5:36 PM  Result Value Ref Range   Prothrombin Time 12.8 11.4 - 15.2 seconds   INR 0.97     Comment: Performed at [facility], [place] [address] [place], [place] [address]  Type and screen [place]     Status: None (Preliminary result)   Collection Time: 01/29/18  5:36 PM  Result Value Ref Range   ABO/RH(D) PENDING    Antibody Screen NEG    Sample Expiration      02/01/2018 Performed at [facility], [place] [address] [place], [place] [address]   I-Stat Beta hCG blood, ED ([facility], [facility], [facility] only)     Status: None   Collection Time: 01/29/18  5:51 PM  Result Value Ref Range   I-stat hCG, quantitative <5.0 <5 mIU/mL   Comment 3            Comment:   GEST. AGE      CONC.  (mIU/mL)   <=1 WEEK        5 - 50     2 WEEKS       50 - 500     3 WEEKS       100 - 10,000     4 WEEKS     1,000 - 30,000        FEMALE AND NON-PREGNANT FEMALE:     LESS THAN 5 mIU/mL   I-Stat Chem 8, ED     Status: None   Collection Time: 01/29/18  5:52 PM  Result Value Ref Range   Sodium 141 135 - 145 mmol/L   Potassium 3.8 3.5 - 5.1 mmol/L   Chloride 102 101 - 111 mmol/L   BUN 11 6 - 20 mg/dL   Creatinine, Ser 0.90 0.44 - 1.00 mg/dL   Glucose, Bld 94 65 - 99 mg/dL   Calcium, Ion 1.21 1.15 - 1.40 mmol/L   TCO2 29 22 - 32 mmol/L   Hemoglobin 14.6 12.0 - 15.0 g/dL   HCT 43.0 36.0 - 46.0 %   No results found.  Pending Labs Unresulted Labs (From admission, onward)   Start     Ordered   01/30/18 0500  Vitamin B12  Tomorrow morning,   R     01/29/18 1804   01/30/18 0500  Iron and  TIBC  Tomorrow morning,   R     01/29/18 1804   01/30/18 0500  Ferritin  Tomorrow morning,   R     01/29/18 1804   01/30/18 0500  CBC with Differential/Platelet  Tomorrow morning,   R     01/29/18 1804      Vitals/Pain Today's Vitals   01/29/18 1655 01/29/18 1828  BP: (!) 136/94   Pulse: 100   Resp: 18   Temp: 98.3 F (36.8 C)   TempSrc:  Oral   SpO2: 99%   Weight:  200 lb (90.7 kg)  Height:  '5\' 4"'  (1.626 m)    Isolation Precautions No active isolations  Medications Medications  Immune Globulin 10% (PRIVIGEN) IV infusion 90 g (not administered)  predniSONE (DELTASONE) tablet 90 mg (not administered)  predniSONE (DELTASONE) tablet 90 mg (90 mg Oral Given 01/29/18 1845)    Mobility walks

## 2018-01-29 NOTE — ED Provider Notes (Signed)
[facility]-EMERGENCY DEPT Provider Note   CSN: [id number] Arrival date & time: 01/29/18  1642     History   Chief Complaint Chief Complaint  Patient presents with  . Abnormal Lab  . Vaginal Bleeding    HPI [name] is a 37 y.o. female.  HPI 37 year old female with history of ITP here with vaginal bleeding.  The patient states she had a viral illness last week.  She had cough, congestion, nausea, vomiting, and body aches.  No fevers.  She was due to start a period on 2/28 but began having profuse vaginal bleeding on 2/24 and has been bleeding since then.  She is been passing large clots.  She went to her OB today who did a pelvic exam and showed significant active bleeding.  She had lab work drawn which showed a platelet count of 13,000.  She was told to present to the ED for further evaluation.  She denies any other bleeding.  She has not noticed any petechiae.  She strongly denies any fevers.  She has not had any altered mental status.  No headache.  She notified her oncologist and was sent here for further evaluation.  Urine pregnancy was negative at the OB.  She had a pelvic exam as well.  Past Medical History:  Diagnosis Date  . Fatigue 08/27/2013  . ITP (idiopathic thrombocytopenic purpura)   . Need for prophylactic vaccination and inoculation against influenza 08/27/2013    Patient Active Problem List   Diagnosis Date Noted  . Vaginal bleeding 03/20/2017  . Sepsis (HCC) 03/20/2017  . Chronic ITP (idiopathic thrombocytopenic purpura) (HCC) 03/20/2017  . 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 Hematochezia 01/26/2016  . Acute ITP (HCC)   . Thrombocytopenia (HCC)   . Need for prophylactic vaccination and inoculation against influenza 08/27/2013  . Iron deficiency anemia 08/27/2013  . Fatigue 08/27/2013  . Idiopathic thrombocytopenic purpura (HCC)     Past Surgical History:  Procedure Laterality Date  . splenetomy  2006    OB History    No data  available       Home Medications    Prior to Admission medications   Medication Sig Start Date End Date Taking? Authorizing Provider  cyclobenzaprine (FLEXERIL) 10 MG tablet Take 10 mg by mouth 3 (three) times daily as needed for muscle spasms.   Yes [provider]  ibuprofen (ADVIL,MOTRIN) 800 MG tablet Take 800 mg by mouth daily as needed for headache or cramping (only uses during her mentrual cycle.).    Yes [provider]  clobetasol (TEMOVATE) 0.05 % external solution  02/01/17   [provider]  ketorolac (TORADOL) 10 MG tablet Take 1 tablet (10 mg total) by mouth every 6 (six) hours as needed. Patient not taking: Reported on 01/29/2018 03/23/17   Jerald Kiefhiu, Stephen K, MD  pantoprazole (PROTONIX) 40 MG tablet Take 1 tablet (40 mg total) by mouth daily. Patient not taking: Reported on 03/28/2017 03/24/17   Jerald Kiefhiu, Stephen K, MD    Family History Family History  Problem Relation Age of Onset  . Irritable bowel syndrome Mother   . Dementia Maternal Grandmother     Social History Social History   Tobacco Use  . Smoking status: Never Smoker  . Smokeless tobacco: Never Used  Substance Use Topics  . Alcohol use: Yes    Comment: occasionally  . Drug use: No     Allergies   Rituximab   Review of Systems Review of Systems  Constitutional:  Positive for fatigue. Negative for chills and fever.  HENT: Negative for congestion, rhinorrhea and sore throat.   Eyes: Negative for visual disturbance.  Respiratory: Negative for cough, shortness of breath and wheezing.   Cardiovascular: Negative for chest pain and leg swelling.  Gastrointestinal: Negative for abdominal pain, diarrhea, nausea and vomiting.  Genitourinary: Positive for vaginal bleeding. Negative for dysuria, flank pain and vaginal discharge.  Musculoskeletal: Negative for neck pain.  Skin: Negative for rash.  Allergic/Immunologic: Negative for immunocompromised state.  Neurological: Negative for  syncope and headaches.  Hematological: Does not bruise/bleed easily.  All other systems reviewed and are negative.    Physical Exam Updated Vital Signs BP (!) 136/94 (BP Location: Right Arm)   Pulse 100   Temp 98.3 F (36.8 C) (Oral)   Resp 18   LMP 01/19/2018   SpO2 99%   Physical Exam  Constitutional: She is oriented to person, place, and time. She appears well-developed and well-nourished. No distress.  HENT:  Head: Normocephalic and atraumatic.  Scattered petechiae noted on the soft palate  Eyes: Conjunctivae are normal.  Neck: Neck supple.  Cardiovascular: Normal rate, regular rhythm and normal heart sounds. Exam reveals no friction rub.  No murmur heard. Pulmonary/Chest: Effort normal and breath sounds normal. No respiratory distress. She has no wheezes. She has no rales.  Abdominal: She exhibits no distension.  Musculoskeletal: She exhibits no edema.  Neurological: She is alert and oriented to person, place, and time. She exhibits normal muscle tone.  Skin: Skin is warm. Capillary refill takes less than 2 seconds.  Developing petechiae bilateral upper and lower extremities  Psychiatric: She has a normal mood and affect.  Nursing note and vitals reviewed.    ED Treatments / Results  Labs (all labs ordered are listed, but only abnormal results are displayed) Labs Reviewed  CBC WITH DIFFERENTIAL/PLATELET - Abnormal; Notable for the following components:      Result Value   MCH 25.9 (*)    Platelets 26 (*)    All other components within normal limits  COMPREHENSIVE METABOLIC PANEL - Abnormal; Notable for the following components:   Total Protein 8.2 (*)    All other components within normal limits  PROTIME-INR  VITAMIN B12  IRON AND TIBC  FERRITIN  CBC WITH DIFFERENTIAL/PLATELET  I-STAT CHEM 8, ED  I-STAT CHEM 8, ED  I-STAT BETA HCG BLOOD, ED ([facility], [facility], [facility] ONLY)  TYPE AND SCREEN    EKG  EKG Interpretation None       Radiology No results  found.  Procedures Procedures (including critical care time)  Medications Ordered in ED Medications  predniSONE (DELTASONE) tablet 90 mg (not administered)  Immune Globulin 10% (PRIVIGEN) IV infusion 1 g/kg (not administered)  predniSONE (DELTASONE) tablet 90 mg (not administered)     Initial Impression / Assessment and Plan / ED Course  I have reviewed the triage vital signs and the nursing notes.  Pertinent labs & imaging results that were available during my care of the patient were reviewed by me and considered in my medical decision making (see chart for details).     37 year old female here with suspected ITP exacerbation in the setting of recent viral illness.  No headache, focal neuro deficits, or signs of intracranial bleeding.  No recent trauma.  Platelet count 13,000 at her OB appointment.  Pelvic exam was unremarkable per records.  Will repeat labs here, type and screen, and plan for admission.  Will place a page to Dr. Bertis Ruddy, patient's  oncologist.  D/w Dr. Bertis Ruddy. Will start on prednisone 1 mg/kg, admit to medicine. OK for floor bed. Will need IVIG. Dr. Bertis Ruddy will see ptin ED.  Final Clinical Impressions(s) / ED Diagnoses   Final diagnoses:  Chronic ITP (idiopathic thrombocytopenia) (HCC)  Thrombocytopenia (HCC)  Petechiae    ED Discharge Orders    None       Shaune Pollack, MD 01/29/18 1815

## 2018-01-29 NOTE — ED Triage Notes (Signed)
Pt sent related to platelets 13; hx of ITP; heavy menstrual cycle for 10 days.

## 2018-01-29 NOTE — H&P (Signed)
History and Physical    FERRIS TALLY ZOX:[id number] DOB: 1981-06-25 DOA: 01/29/2018  PCP: Maurice Small, MD   Patient coming from: Home   Chief Complaint: Heavy periods, low platelets- 13  HPI: [name] is a 37 y.o. female with medical history significant for recurrent ITP, scalp psoriasis, iron deficiency anemia.  Patient reports heavy vaginal bleeding which started 2/24, ( period starting earlier than expected - 2/27) with subsequent passage of blood clots, also onset of headaches, sore throat, cough nasal congestion, over the weekend, 3-4 days ago.  No fevers or chills.  Patient denies dizziness, no chest pains.  Occasionally takes ibuprofen for menstrual cramps.  Patient notes onset of petechial rash on her extremities today in the ED. Saw her PCP today for menorrhagia , blood work revealed low platelets  ~13, and was sent to the ED  ED Course: Pulse 100, otherwise stable vitals.  Hemoglobin stable 12.8, let us know 26, otherwise BMP creatinine unremarkable.  Dr. Bertis Ruddy, oncologist was consulted in the ED recommended starting prednisone at 1 mg/kg and IVIG, see patient in ED, to admit to hospitalist service.  Review of Systems: As per HPI otherwise 10 point review of systems negative.   Past Medical History:  Diagnosis Date  . Fatigue 08/27/2013  . ITP (idiopathic thrombocytopenic purpura)   . Need for prophylactic vaccination and inoculation against influenza 08/27/2013    Past Surgical History:  Procedure Laterality Date  . splenetomy  2006     reports that  has never smoked. she has never used smokeless tobacco. She reports that she drinks alcohol. She reports that she does not use drugs.  Allergies  Allergen Reactions  . Rituximab Swelling    Family History  Problem Relation Age of Onset  . Irritable bowel syndrome Mother   . Dementia Maternal Grandmother     Prior to Admission medications   Medication Sig Start Date End Date Taking? Authorizing  Provider  cyclobenzaprine (FLEXERIL) 10 MG tablet Take 10 mg by mouth 3 (three) times daily as needed for muscle spasms.   Yes [provider]  ibuprofen (ADVIL,MOTRIN) 800 MG tablet Take 800 mg by mouth daily as needed for headache or cramping (only uses during her mentrual cycle.).    Yes [provider]  clobetasol (TEMOVATE) 0.05 % external solution  02/01/17   [provider]  ketorolac (TORADOL) 10 MG tablet Take 1 tablet (10 mg total) by mouth every 6 (six) hours as needed. Patient not taking: Reported on 01/29/2018 03/23/17   Jerald Kief, MD  pantoprazole (PROTONIX) 40 MG tablet Take 1 tablet (40 mg total) by mouth daily. Patient not taking: Reported on 03/28/2017 03/24/17   Jerald Kief, MD    Physical Exam: Vitals:   01/29/18 1655 01/29/18 1828  BP: (!) 136/94   Pulse: 100   Resp: 18   Temp: 98.3 F (36.8 C)   TempSrc: Oral   SpO2: 99%   Weight:  90.7 kg (200 lb)  Height:  5\' 4"  (1.626 m)    Constitutional: NAD, calm, comfortable Vitals:   01/29/18 1655 01/29/18 1828  BP: (!) 136/94   Pulse: 100   Resp: 18   Temp: 98.3 F (36.8 C)   TempSrc: Oral   SpO2: 99%   Weight:  90.7 kg (200 lb)  Height:  5\' 4"  (1.626 m)   Eyes: PERRL, lids and conjunctivae normal ENMT: Mucous membranes are moist, with 2 petechial spots on hard palate , posterior pharynx clear  of any exudate or lesions.Normal dentition.  Neck: normal, supple, no masses, no thyromegaly Respiratory: clear to auscultation bilaterally, no wheezing, no crackles. Normal respiratory effort. No accessory muscle use.  Cardiovascular: Regular rate and rhythm, no murmurs / rubs / gallops. No extremity edema. 2+ pedal pulses.  Abdomen: no tenderness, no masses palpated. No hepatosplenomegaly. Bowel sounds positive.  Musculoskeletal: no clubbing / cyanosis. No joint deformity upper and lower extremities. Good ROM, no contractures. Normal muscle tone.  Skin: Few petechia diffusely on upper  extremities and lower extremities, no ulcers. No induration Neurologic: CN 2-12 grossly intact. Sensation intact, DTR normal. Strength 5/5 in all 4.  Psychiatric: Normal judgment and insight. Alert and oriented x 3. Normal mood.   Labs on Admission: I have personally reviewed following labs and imaging studies  CBC: Recent Labs  Lab 01/29/18 1736 01/29/18 1752  WBC 9.8  --   NEUTROABS 6.0  --   HGB 12.8 14.6  HCT 39.8 43.0  MCV 80.6  --   PLT 26*  --    Basic Metabolic Panel: Recent Labs  Lab 01/29/18 1736 01/29/18 1752  NA 140 141  K 3.9 3.8  CL 104 102  CO2 28  --   GLUCOSE 99 94  BUN 12 11  CREATININE 0.85 0.90  CALCIUM 9.4  --    Liver Function Tests: Recent Labs  Lab 01/29/18 1736  AST 20  ALT 14  ALKPHOS 86  BILITOT 0.6  PROT 8.2*  ALBUMIN 3.9   Coagulation Profile: Recent Labs  Lab 01/29/18 1736  INR 0.97   Urine analysis:    Component Value Date/Time   COLORURINE YELLOW 03/20/2017 1830   APPEARANCEUR CLEAR 03/20/2017 1830   LABSPEC 1.018 03/20/2017 1830   LABSPEC 1.020 08/23/2006 1632   PHURINE 7.0 03/20/2017 1830   GLUCOSEU NEGATIVE 03/20/2017 1830   HGBUR NEGATIVE 03/20/2017 1830   BILIRUBINUR NEGATIVE 03/20/2017 1830   BILIRUBINUR Negative 08/23/2006 1632   KETONESUR 20 (A) 03/20/2017 1830   PROTEINUR NEGATIVE 03/20/2017 1830   NITRITE NEGATIVE 03/20/2017 1830   LEUKOCYTESUR NEGATIVE 03/20/2017 1830   LEUKOCYTESUR Negative 08/23/2006 1632    Radiological Exams on Admission: No results found.  EKG: None   Assessment/Plan Active Problems:   Acute ITP (HCC)   Thrombocytopenia (HCC)   Chronic ITP (idiopathic thrombocytopenic purpura) (HCC)  Recurrent Acute ITP- Plt- 26, last check 135 03/2017.  Likely provoked by viral illness.  -Oncology recommendations appreciated-prednisone, start IVIG, Vit b12, cbc tomorrow, no need for further gyn workup, menorrhagia likely due to severe thrombocytopenia triggered by recent viral  illness -Continue prednisone and IVIG per oncology. -Continue home Protonix, in the setting of severe thrombocytopenia  DVT prophylaxis:SCds Code Status: Full  Family Communication: None at bedside Disposition Plan: Per Oncology, when Plt >50, 000, next 3-4 days Consults called: Oncology, dr. Bertis RuddyGorsuch Admission status:Inpt, tele  Onnie BoerEjiroghene E Graison Leinberger MD [facility] Pager [phone] From 6PM-2AM.  Otherwise please contact night-coverage [email or web address] Password [email or web address]  01/29/2018, 6:28 PM

## 2018-01-29 NOTE — Progress Notes (Signed)
[facility] CONSULT NOTE  Patient Care Team: Maurice SmallGriffin, Elaine, MD as PCP - General (Family Medicine) Artis DelayGorsuch, Naven Giambalvo, MD as Consulting Physician (Hematology and Oncology)  CHIEF COMPLAINTS/PURPOSE OF CONSULTATION:  Recurrent ITP   HISTORY OF PRESENTING ILLNESS:  [name] 37 y.o. female is here because of thrombocytopenia.  She was found to have abnormal CBC from abnormal blood work today at the primary care physician's office. This patient is well-known to me.  She have history of recurrent ITP. She was last seen around May of 2018 for similar problem. Summary of hematology history: This is a very pleasant lady with history of chronic ITP. 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 She responded well to prednisone but due to the excessive weight gain and other side effects, prefer not to be treated with prednisone. When she was treated with rituximab before 2010, she did develop significant allergic reaction.  She was admitted to the hospital from 01/26/2016 to 01/28/2016 with recurrence of ITP. She received IVIG and high-dose dexamethasone with improvement of her platelet count The patient had relapse of ITP, hospitalized from 03/20/2017 to 03/23/2017 with IVIG and steroids treatment.  This episode was triggered by stress and recent influenza infection.  She has been having significant menorrhagia, nonstop for almost 10 days.  She had passage of large amount of clots. She also noted petechiae hemorrhage in her lower extremities.  She noted recent bruising but denies spontaneous epistaxis, hematuria, melena or hematochezia The patient denies history of liver disease, exposure to heparin, history of cardiac murmur/prior cardiovascular surgery or recent new medications Her last platelet transfusion was a year ago. She had a flu-like illness approximately a week ago with some diarrhea, myalgias and sore throat, resolved  spontaneously.   MEDICAL HISTORY:  Past Medical History:  Diagnosis Date  . Fatigue 08/27/2013  . ITP (idiopathic thrombocytopenic purpura)   . Need for prophylactic vaccination and inoculation against influenza 08/27/2013    SURGICAL HISTORY: Past Surgical History:  Procedure Laterality Date  . splenetomy  2006    SOCIAL HISTORY: Social History   Socioeconomic History  . Marital status: Married    Spouse name: Not on file  . Number of children: 0  . Years of education: Not on file  . Highest education level: Not on file  Social Needs  . Financial resource strain: Not on file  . Food insecurity - worry: Not on file  . Food insecurity - inability: Not on file  . Transportation needs - medical: Not on file  . Transportation needs - non-medical: Not on file  Occupational History    Employer: [employer]    Comment: branch management, [employer]  Tobacco Use  . Smoking status: Never Smoker  . Smokeless tobacco: Never Used  Substance and Sexual Activity  . Alcohol use: Yes    Comment: occasionally  . Drug use: No  . Sexual activity: Not on file  Other Topics Concern  . Not on file  Social History Narrative  . Not on file    FAMILY HISTORY: Family History  Problem Relation Age of Onset  . Irritable bowel syndrome Mother   . Dementia Maternal Grandmother     ALLERGIES:  is allergic to rituximab.  MEDICATIONS:  Current Facility-Administered Medications  Medication Dose Route Frequency Provider Last Rate Last Dose  . Immune Globulin 10% (PRIVIGEN) IV infusion 90 g  1 g/kg Intravenous Q24H Artis DelayGorsuch, Lilliona Blakeney, MD      . [  START ON 01/30/2018] predniSONE (DELTASONE) tablet 90 mg  90 mg Oral Q breakfast Artis Delay, MD       Current Outpatient Medications  Medication Sig Dispense Refill  . cyclobenzaprine (FLEXERIL) 10 MG tablet Take 10 mg by mouth 3 (three) times daily as needed for muscle spasms.    Marland Kitchen ibuprofen (ADVIL,MOTRIN) 800 MG tablet Take 800 mg by mouth daily as  needed for headache or cramping (only uses during her mentrual cycle.).     Marland Kitchen clobetasol (TEMOVATE) 0.05 % external solution   0  . ketorolac (TORADOL) 10 MG tablet Take 1 tablet (10 mg total) by mouth every 6 (six) hours as needed. (Patient not taking: Reported on 01/29/2018) 20 tablet 0  . pantoprazole (PROTONIX) 40 MG tablet Take 1 tablet (40 mg total) by mouth daily. (Patient not taking: Reported on 03/28/2017) 30 tablet 0    REVIEW OF SYSTEMS:   Constitutional: Denies fevers, chills or abnormal night sweats Eyes: Denies blurriness of vision, double vision or watery eyes Ears, nose, mouth, throat, and face: Denies mucositis or sore throat Respiratory: Denies cough, dyspnea or wheezes Cardiovascular: Denies palpitation, chest discomfort or lower extremity swelling Gastrointestinal:  Denies nausea, heartburn or change in bowel habits Skin: Denies abnormal skin rashes Lymphatics: Denies new lymphadenopathy  Neurological:Denies numbness, tingling or new weaknesses Behavioral/Psych: Mood is stable, no new changes  All other systems were reviewed with the patient and are negative.  PHYSICAL EXAMINATION: ECOG PERFORMANCE STATUS: 1 - Symptomatic but completely ambulatory  Vitals:   01/29/18 1655  BP: (!) 136/94  Pulse: 100  Resp: 18  Temp: 98.3 F (36.8 C)  SpO2: 99%   Filed Weights   01/29/18 1828  Weight: 200 lb (90.7 kg)    GENERAL:alert, no distress and comfortable SKIN: skin color, texture, turgor are normal, no rashes or significant lesions.  Noted petechiae hemorrhages on lower extremity EYES: normal, conjunctiva are pink and non-injected, sclera clear OROPHARYNX:no exudate, no erythema and lips, buccal mucosa, and tongue normal.  Noted oral mucosa petechiae on her hard palate NECK: supple, thyroid normal size, non-tender, without nodularity LYMPH:  no palpable lymphadenopathy in the cervical, axillary or inguinal LUNGS: clear to auscultation and percussion with normal  breathing effort HEART: regular rate & rhythm and no murmurs and no lower extremity edema ABDOMEN:abdomen soft, non-tender and normal bowel sounds Musculoskeletal:no cyanosis of digits and no clubbing  PSYCH: alert & oriented x 3 with fluent speech NEURO: no focal motor/sensory deficits  LABORATORY DATA:  I have reviewed the data as listed Lab Results  Component Value Date   WBC 9.8 01/29/2018   HGB 14.6 01/29/2018   HCT 43.0 01/29/2018   MCV 80.6 01/29/2018   PLT 26 (LL) 01/29/2018     ASSESSMENT & PLAN Recurrent, acute ITP This is likely triggered by recent viral illness Her menorrhagia is due to severe thrombocytopenia She does not need platelet transfusion I recommend prednisone therapy, 1 mg/kg daily along with IVIG at 1 g/kg IV times 2 days I will check serum vitamin B12 tomorrow and CBC daily The patient is instructed to let nursing staff be aware if she started to have nosebleed, spontaneous hematuria or hematochezia  Menorrhagia This is due to severe ITP She does not need further GYN workup at this point  DVT prophylaxis Avoid Lovenox Encourage ambulation  Discharge planning Hopefully the next 3-4 days once her platelet count is above 50,000   All questions were answered. The patient knows to call the clinic with  any problems, questions or concerns. No barriers to learning was detected    Artis Delay, MD 01/29/2018 7:19 PM

## 2018-01-29 NOTE — ED Notes (Signed)
Bed: WA21 Expected date:  Expected time:  Means of arrival:  Comments: [place]

## 2018-01-30 DIAGNOSIS — D509 Iron deficiency anemia, unspecified: Secondary | ICD-10-CM

## 2018-01-30 DIAGNOSIS — R519 Headache, unspecified: Secondary | ICD-10-CM

## 2018-01-30 DIAGNOSIS — R51 Headache: Secondary | ICD-10-CM

## 2018-01-30 DIAGNOSIS — N920 Excessive and frequent menstruation with regular cycle: Secondary | ICD-10-CM | POA: Diagnosis present

## 2018-01-30 LAB — CBC WITH DIFFERENTIAL/PLATELET
BASOS PCT: 0 %
Basophils Absolute: 0 10*3/uL (ref 0.0–0.1)
EOS PCT: 0 %
Eosinophils Absolute: 0 10*3/uL (ref 0.0–0.7)
HCT: 33.6 % — ABNORMAL LOW (ref 36.0–46.0)
HEMOGLOBIN: 10.9 g/dL — AB (ref 12.0–15.0)
LYMPHS ABS: 0.9 10*3/uL (ref 0.7–4.0)
Lymphocytes Relative: 8 %
MCH: 26.1 pg (ref 26.0–34.0)
MCHC: 32.4 g/dL (ref 30.0–36.0)
MCV: 80.4 fL (ref 78.0–100.0)
MONO ABS: 0.4 10*3/uL (ref 0.1–1.0)
MONOS PCT: 4 %
Neutro Abs: 9.3 10*3/uL (ref 1.7–7.7)
Neutrophils Relative %: 88 %
Platelets: 42 10*3/uL — ABNORMAL LOW (ref 150–400)
RBC: 4.18 MIL/uL (ref 3.87–5.11)
RDW: 15.4 % (ref 11.5–15.5)
WBC: 10.6 10*3/uL — ABNORMAL HIGH (ref 4.0–10.5)

## 2018-01-30 LAB — VITAMIN B12: Vitamin B-12: 465 pg/mL (ref 180–914)

## 2018-01-30 MED ORDER — ZOLPIDEM TARTRATE 5 MG PO TABS
5.0000 mg | ORAL_TABLET | Freq: Every evening | ORAL | Status: DC | PRN
  Administered 2018-01-30: 5 mg via ORAL
  Filled 2018-01-30: qty 1

## 2018-01-30 MED ORDER — PROCHLORPERAZINE EDISYLATE 5 MG/ML IJ SOLN
10.0000 mg | Freq: Four times a day (QID) | INTRAMUSCULAR | Status: DC | PRN
  Administered 2018-01-30: 10 mg via INTRAVENOUS
  Filled 2018-01-30: qty 2

## 2018-01-30 MED ORDER — ACETAMINOPHEN 325 MG PO TABS: 650.0000 mg | ORAL_TABLET | ORAL | Status: DC | PRN

## 2018-01-30 MED ORDER — TRAMADOL HCL 50 MG PO TABS
50.0000 mg | ORAL_TABLET | Freq: Four times a day (QID) | ORAL | Status: DC | PRN
  Administered 2018-01-30: 100 mg via ORAL
  Filled 2018-01-30: qty 2

## 2018-01-30 MED ORDER — MORPHINE SULFATE (PF) 2 MG/ML IV SOLN: 2.0000 mg | INTRAVENOUS | Status: DC | PRN

## 2018-01-30 NOTE — Progress Notes (Signed)
PROGRESS NOTE    [name]  WJX:[id number] DOB: 03-09-1981 DOA: 01/29/2018 PCP: Maurice Small, MD    Brief Narrative: Patient is a pleasant 37 year old female history of chronic ITP with history of splenectomy in 2006 with recurrence that has responded well in the past to prednisone.  Patient had a recurrence of IT P in March 2017 received IVIG and high-dose dexamethasone with improvement with platelet count.  Patient also noted to have relapse of I TIP and had to be hospitalized April 2018 with IVIG and steroid treatment.  Patient presenting back with significant menorrhagia nonstop for approximately 10 days passing large amounts of clots, lower extremity petechiae and noted to have a platelet count at the PCPs office of 26.  Patient admitted started on IVIG and prednisone per hematology/oncology.   Assessment & Plan:   Principal Problem:   Acute ITP (HCC) Active Problems:   Iron deficiency anemia   Thrombocytopenia (HCC)   Chronic ITP (idiopathic thrombocytopenic purpura) (HCC)   Menorrhagia  #1 acute recurrent ITP Felt likely triggered by recent viral illness per hematology/oncology.  It was felt patient's menorrhagia was secondary to severe thrombocytopenia.  Patient denies any nosebleeds.  Platelet count currently at 42 from 26.  Patient received dose of IVIG yesterday evening without any complications.  Patient for another dose of IVIG today.  Continue current regimen of prednisone 1 mg/kg daily in addition to IVIG 1 g/kg for total of 2 days.  Vitamin B12 levels that 465. Hematology/oncology following and appreciate input and recommendations.  2.  Iron deficiency anemia Hemoglobin currently at 10.9 from 14.6 on admission.  Drop in hemoglobin likely secondary to menorrhagia.  Follow.  3.  Menorrhagia Likely secondary to problem #1.  See problem #1.  4.  Headache Compazine and tramadol/morphine as needed   DVT prophylaxis: SCDs Code Status: Full Family Communication:  Updated patient.  No family at bedside. Disposition Plan: Likely home when clinically improved, platelet count over 50,000 and per hematology/oncology hopefully in the next 24-48 hours.   Consultants:   Hematology/oncology: Dr. Bertis Ruddy 01/29/2018  Procedures:   IVIG  Antimicrobials:   None   Subjective: Patient laying in bed.  Patient complaining of a headache however states not too terrible at this time to ask for medications.  Patient feels menorrhagia is slowly improving.  Denies any nosebleeds.  Tolerated IVIG overnight.  Objective: Vitals:   01/30/18 0132 01/30/18 0239 01/30/18 0339 01/30/18 0435  BP: 112/67 129/64 110/60 117/66  Pulse: 87 88 97 (!) 102  Resp: 16 20 18 20   Temp: 97.7 F (36.5 C) 97.7 F (36.5 C) 98.9 F (37.2 C) 98 F (36.7 C)  TempSrc: Oral Oral Oral Oral  SpO2: 99% 100% 97% 100%  Weight:      Height:        Intake/Output Summary (Last 24 hours) at 01/30/2018 1130 Last data filed at 01/30/2018 0436 Gross per 24 hour  Intake 1386.75 ml  Output 0 ml  Net 1386.75 ml   Filed Weights   01/29/18 1828 01/29/18 1959  Weight: 90.7 kg (200 lb) 89.5 kg (197 lb 5 oz)    Examination:  General exam: Appears calm and comfortable  Respiratory system: Clear to auscultation. Respiratory effort normal. Cardiovascular system: S1 & S2 heard, RRR. No JVD, murmurs, rubs, gallops or clicks. No pedal edema. Gastrointestinal system: Abdomen is nondistended, soft and nontender. No organomegaly or masses felt. Normal bowel sounds heard. Central nervous system: Alert and oriented. No focal neurological deficits. Extremities: Symmetric  5 x 5 power. Skin: No rashes, lesions or ulcers Psychiatry: Judgement and insight appear normal. Mood & affect appropriate.     Data Reviewed: I have personally reviewed following labs and imaging studies  CBC: Recent Labs  Lab 01/29/18 1736 01/29/18 1752 01/30/18 0434  WBC 9.8  --  10.6*  NEUTROABS 6.0  --  9.3  HGB 12.8 14.6  10.9*  HCT 39.8 43.0 33.6*  MCV 80.6  --  80.4  PLT 26*  --  42*   Basic Metabolic Panel: Recent Labs  Lab 01/29/18 1736 01/29/18 1752  NA 140 141  K 3.9 3.8  CL 104 102  CO2 28  --   GLUCOSE 99 94  BUN 12 11  CREATININE 0.85 0.90  CALCIUM 9.4  --    GFR: Estimated Creatinine Clearance: 94.5 mL/min (by C-G formula based on SCr of 0.9 mg/dL). Liver Function Tests: Recent Labs  Lab 01/29/18 1736  AST 20  ALT 14  ALKPHOS 86  BILITOT 0.6  PROT 8.2*  ALBUMIN 3.9   No results for input(s): LIPASE, AMYLASE in the last 168 hours. No results for input(s): AMMONIA in the last 168 hours. Coagulation Profile: Recent Labs  Lab 01/29/18 1736  INR 0.97   Cardiac Enzymes: No results for input(s): CKTOTAL, CKMB, CKMBINDEX, TROPONINI in the last 168 hours. BNP (last 3 results) No results for input(s): PROBNP in the last 8760 hours. HbA1C: No results for input(s): HGBA1C in the last 72 hours. CBG: No results for input(s): GLUCAP in the last 168 hours. Lipid Profile: No results for input(s): CHOL, HDL, LDLCALC, TRIG, CHOLHDL, LDLDIRECT in the last 72 hours. Thyroid Function Tests: No results for input(s): TSH, T4TOTAL, FREET4, T3FREE, THYROIDAB in the last 72 hours. Anemia Panel: Recent Labs    01/30/18 0434  VITAMINB12 465   Sepsis Labs: No results for input(s): PROCALCITON, LATICACIDVEN in the last 168 hours.  No results found for this or any previous visit (from the past 240 hour(s)).       Radiology Studies: No results found.      Scheduled Meds: . pantoprazole  40 mg Oral Daily  . predniSONE  90 mg Oral Q breakfast   Continuous Infusions: . IMMUNE GLOBULIN 10% (HUMAN) IV - For Fluid Restriction Only Stopped (01/30/18 0255)     LOS: 1 day    Time spent: 40 mins    Ramiro Harvestaniel Candy Leverett, MD [facility] Pager [phone]  If 7PM-7AM, please contact night-coverage [email or web address] Password [email or web address] 01/30/2018, 11:30 AM

## 2018-01-30 NOTE — Progress Notes (Signed)
Brandy BeltFamilia J Carles   DOB:August 21, 1981   ZO#:[id number]#:[id number]    Assessment & Plan:  Recurrent, acute ITP This is likely triggered by recent viral illness Her menorrhagia is due to severe thrombocytopenia She does not need platelet transfusion I recommend prednisone therapy, 1 mg/kg daily along with IVIG at 1 g/kg IV times 2 days (started 01/29/18) So far, she tolerated treatment well with improvement of her blood count. Serum vitamin B12 is pending Continue CBC daily The patient is instructed to let nursing staff be aware if she started to have nosebleed, spontaneous hematuria or hematochezia  Menorrhagia This is due to severe ITP She does not need further GYN workup at this point  Mild anemia Likely due to bleeding Will check iron study tomorrow  DVT prophylaxis Avoid Lovenox Encourage ambulation  Discharge planning Hopefully DC tomorrow once her platelet count is above 50,000     Subjective:  She tolerated IVIG well.  She did not sleep well last night.  She continues to have menstrual period. The patient denies any recent signs or symptoms of bleeding such as spontaneous epistaxis, hematuria or hematochezia.  Objective:  Vitals:   01/30/18 0339 01/30/18 0435  BP: 110/60 117/66  Pulse: 97 (!) 102  Resp: 18 20  Temp: 98.9 F (37.2 C) 98 F (36.7 C)  SpO2: 97% 100%     Intake/Output Summary (Last 24 hours) at 01/30/2018 0758 Last data filed at 01/30/2018 0436 Gross per 24 hour  Intake 1386.75 ml  Output 0 ml  Net 1386.75 ml    GENERAL:alert, no distress and comfortable.Marland Kitchen.   SKIN: skin color, texture, turgor are normal, no rashes or significant lesions. Skin petechiae is present EYES: normal, Conjunctiva are pink and non-injected, sclera clear OROPHARYNX:no exudate, no erythema and lips, buccal mucosa, and tongue normal.  Oral petechiae is present NECK: supple, thyroid normal size, non-tender, without nodularity LYMPH:  no palpable lymphadenopathy in the cervical, axillary  or inguinal LUNGS: clear to auscultation and percussion with normal breathing effort HEART: regular rate & rhythm and no murmurs and no lower extremity edema ABDOMEN:abdomen soft, non-tender and normal bowel sounds Musculoskeletal:no cyanosis of digits and no clubbing  NEURO: alert & oriented x 3 with fluent speech, no focal motor/sensory deficits   Labs:  Lab Results  Component Value Date   WBC 10.6 (H) 01/30/2018   HGB 10.9 (L) 01/30/2018   HCT 33.6 (L) 01/30/2018   MCV 80.4 01/30/2018   PLT 42 (L) 01/30/2018   NEUTROABS 9.3 01/30/2018    Lab Results  Component Value Date   NA 141 01/29/2018   K 3.8 01/29/2018   CL 102 01/29/2018   CO2 28 01/29/2018    Artis DelayNi Maitland Muhlbauer, MD 01/30/2018  7:58 AM

## 2018-01-31 DIAGNOSIS — R233 Spontaneous ecchymoses: Secondary | ICD-10-CM

## 2018-01-31 DIAGNOSIS — D72829 Elevated white blood cell count, unspecified: Secondary | ICD-10-CM

## 2018-01-31 DIAGNOSIS — D649 Anemia, unspecified: Secondary | ICD-10-CM

## 2018-01-31 LAB — CBC WITH DIFFERENTIAL/PLATELET
BASOS PCT: 0 %
Basophils Absolute: 0 10*3/uL (ref 0.0–0.1)
EOS PCT: 0 %
Eosinophils Absolute: 0 10*3/uL (ref 0.0–0.7)
HEMATOCRIT: 31 % — AB (ref 36.0–46.0)
Hemoglobin: 9.8 g/dL — ABNORMAL LOW (ref 12.0–15.0)
LYMPHS ABS: 1.8 10*3/uL (ref 0.7–4.0)
Lymphocytes Relative: 11 %
MCH: 25.5 pg — ABNORMAL LOW (ref 26.0–34.0)
MCHC: 31.6 g/dL (ref 30.0–36.0)
MCV: 80.7 fL (ref 78.0–100.0)
MONOS PCT: 6 %
Monocytes Absolute: 1 10*3/uL (ref 0.1–1.0)
Neutro Abs: 13.5 10*3/uL — ABNORMAL HIGH (ref 1.7–7.7)
Neutrophils Relative %: 83 %
Platelets: 172 10*3/uL (ref 150–400)
RBC: 3.84 MIL/uL — ABNORMAL LOW (ref 3.87–5.11)
RDW: 15.5 % (ref 11.5–15.5)
WBC: 16.3 10*3/uL — AB (ref 4.0–10.5)

## 2018-01-31 LAB — IRON AND TIBC
Iron: 25 ug/dL — ABNORMAL LOW (ref 28–170)
Saturation Ratios: 10 % — ABNORMAL LOW (ref 10.4–31.8)
TIBC: 251 ug/dL (ref 250–450)
UIBC: 226 ug/dL

## 2018-01-31 LAB — BASIC METABOLIC PANEL WITH GFR
Anion gap: 6 (ref 5–15)
BUN: 11 mg/dL (ref 6–20)
CO2: 24 mmol/L (ref 22–32)
Calcium: 8.4 mg/dL — ABNORMAL LOW (ref 8.9–10.3)
Chloride: 104 mmol/L (ref 101–111)
Creatinine, Ser: 0.75 mg/dL (ref 0.44–1.00)
GFR calc Af Amer: >60 mL/min (ref >60)
GFR calc non Af Amer: >60 mL/min (ref >60)
Glucose, Bld: 107 mg/dL — ABNORMAL HIGH (ref 65–99)
Potassium: 3.7 mmol/L (ref 3.5–5.1)
Sodium: 134 mmol/L — ABNORMAL LOW (ref 135–145)

## 2018-01-31 LAB — FERRITIN: Ferritin: 15 ng/mL (ref 11–307)

## 2018-01-31 LAB — MAGNESIUM: MAGNESIUM: 1.8 mg/dL (ref 1.7–2.4)

## 2018-01-31 MED ORDER — ONDANSETRON HCL 4 MG PO TABS: 4.0000 mg | ORAL_TABLET | Freq: Four times a day (QID) | ORAL | 0 refills | Status: AC | PRN

## 2018-01-31 MED ORDER — POLYETHYLENE GLYCOL 3350 17 G PO PACK: 17.0000 g | PACK | Freq: Every day | ORAL | 0 refills | Status: AC | PRN

## 2018-01-31 MED ORDER — MAGNESIUM SULFATE 4 GM/100ML IV SOLN
4.0000 g | Freq: Once | INTRAVENOUS | Status: AC
  Administered 2018-01-31: 4 g via INTRAVENOUS
  Filled 2018-01-31: qty 100

## 2018-01-31 MED ORDER — FLUCONAZOLE 150 MG PO TABS: 150.0000 mg | ORAL_TABLET | Freq: Once | ORAL | 0 refills | Status: AC

## 2018-01-31 MED ORDER — TRAMADOL HCL 50 MG PO TABS
50.0000 mg | ORAL_TABLET | Freq: Four times a day (QID) | ORAL | 0 refills | Status: AC | PRN
Start: 2018-01-31 — End: ?

## 2018-01-31 MED ORDER — CLOTRIMAZOLE 2 % VA CREA: 1.0000 | TOPICAL_CREAM | Freq: Every day | VAGINAL | 0 refills | Status: AC

## 2018-01-31 MED ORDER — PANTOPRAZOLE SODIUM 40 MG PO TBEC: 40.0000 mg | DELAYED_RELEASE_TABLET | Freq: Every day | ORAL | 0 refills | Status: AC

## 2018-01-31 NOTE — Progress Notes (Signed)
[name]   DOB:11/08/1981   GL#:[id number]#:[id number]    Assessment & Plan:   Recurrent, acute ITP This is likely triggered by recent viral illness Her menorrhagia is due to severe thrombocytopenia She does not need platelet transfusion I recommend prednisone therapy, 1 mg/kg daily along with IVIG at 1 g/kg IV times 2 days (started 01/29/18) So far, she tolerated treatment well with resolution of thrombocytopenia Serum vitamin B12 is adequate I recommend we complete 1 more dose of prednisone today before discharge.  She does not need prednisone taper upon discharge.  I will follow-up next week  Menorrhagia This is due to severe ITP She does not need further GYN workup at this point It is resolving with resolution of ITP  Mild anemia Likely due to bleeding Observe only.  She does not need blood transfusion  DVT prophylaxis Avoid Lovenox Encourage ambulation  Discharge planning She can be discharged today.  I will follow-up with her next week.  We will sign off.   Artis DelayNi Delray Reza, MD 01/31/2018  7:59 AM   Subjective:  She complained of poor sleep and mild headaches.  Her menstrual period is lightening up.  She denies other forms of bleeding.  She felt a bit weak overall.  Objective:  Vitals:   01/31/18 0138 01/31/18 0410  BP: 112/77 (!) 95/55  Pulse: 80 78  Resp: 16 20  Temp: 97.7 F (36.5 C) 97.7 F (36.5 C)  SpO2: 100% 100%     Intake/Output Summary (Last 24 hours) at 01/31/2018 0759 Last data filed at 01/31/2018 0412 Gross per 24 hour  Intake 0 ml  Output -  Net 0 ml    GENERAL:alert, no distress and comfortable NEURO: alert & oriented x 3 with fluent speech, no focal motor/sensory deficits   Labs:  Lab Results  Component Value Date   WBC 16.3 (H) 01/31/2018   HGB 9.8 (L) 01/31/2018   HCT 31.0 (L) 01/31/2018   MCV 80.7 01/31/2018   PLT 172 01/31/2018   NEUTROABS 13.5 (H) 01/31/2018    Lab Results  Component Value Date   NA 134 (L) 01/31/2018   K 3.7  01/31/2018   CL 104 01/31/2018   CO2 24 01/31/2018    Studies:  No results found.

## 2018-01-31 NOTE — Discharge Summary (Signed)
Physician Discharge Summary  [name] ZOX:[id number] DOB: [birth year] DOA: 01/29/2018  PCP: Maurice Small, MD  Admit date: 01/29/2018 Discharge date: 01/31/2018  Time spent: 60 minutes  Recommendations for Outpatient Follow-up:  1. Follow up with Dr. Bertis Ruddy, hematology/oncology on Tuesday, 02/05/2018 as scheduled.  On follow-up patient will need a CBC with differential as well as a basic metabolic profile.   Discharge Diagnoses:  Principal Problem:   Acute ITP (HCC) Active Problems:   Iron deficiency anemia   Thrombocytopenia (HCC)   Chronic ITP (idiopathic thrombocytopenic purpura) (HCC)   Menorrhagia   Nonintractable headache   Leukocytosis   Petechiae   Discharge Condition: Stable and improved.  Diet recommendation: Regular  Filed Weights   01/29/18 1828 01/29/18 1959  Weight: 90.7 kg (200 lb) 89.5 kg (197 lb 5 oz)    History of present illness:  Per Dr. Wendall Stade [name] is a 37 y.o. female with medical history significant for recurrent ITP, scalp psoriasis, iron deficiency anemia.  Patient reported heavy vaginal bleeding which started 2/24, ( period starting earlier than expected - 2/27) with subsequent passage of blood clots, also onset of headaches, sore throat, cough nasal congestion, over the weekend, 3-4 days ago.  No fevers or chills.  Patient denied dizziness, no chest pains.  Occasionally takes ibuprofen for menstrual cramps.  Patient noted onset of petechial rash on her extremities on the day of admission, in the ED. Saw her PCP today for menorrhagia , blood work revealed low platelets  ~13, and was sent to the ED  ED Course: Pulse 100, otherwise stable vitals.  Hemoglobin stable 12.8, let us know 26, otherwise BMP creatinine unremarkable.  Dr. Bertis Ruddy, oncologist was consulted in the ED recommended starting prednisone at 1 mg/kg and IVIG, see patient in ED, to admit to hospitalist service.    Hospital Course:  #1 acute recurrent ITP Felt  likely triggered by recent viral illness per hematology/oncology.  It was felt patient's menorrhagia was secondary to severe thrombocytopenia.  Patient denied any nosebleeds.  Patient was admitted and noted to have a platelet count of  26 on admission from blood work from PCP at 13.  Patient was admitted and hematology/oncology consulted.  It was recommended that patient received 2 days of IVIG as well as 2 days of steroids.  Patient was started on prednisone 1 mg/kg daily in addition to IVIG 1 g/kg for total of 2 days.  Vitamin B12 levels that 465.  Patient completed a total of 2 days of IVIG without any complications.  Platelet count improved such that by day of discharge platelet count was up to 172.  Patient is to follow-up with hematology oncology in the outpatient setting on Tuesday, 02/05/2018.  Patient will be discharged in stable and improved condition.   2.  Iron deficiency anemia/acute blood loss anemia secondary to #3 Hemoglobin stabilized at 9.8 by day of discharge from 10.9 from 14.6 on admission.  Drop in hemoglobin likely secondary to menorrhagia.  Stated menorrhagia was starting to lighten up after treatment of recurrent acute ITP with IVIG and steroids.  Patient remained in stable condition.  Outpatient follow-up.   3.  Menorrhagia Likely secondary to problem #1.  See problem #1.  No further workup needed at this time.  Hematology/oncology.  4.  Headache Compazine and tramadol/morphine as needed.  Headache improved on this.  5.  Leukocytosis Secondary to steroids.  Patient had no signs or symptoms of infection.  No antibiotics needed.  Outpatient follow-up.  Procedures:  IVIG times 2 days  Consultations:  Hematology/oncology: Dr. Bertis Ruddy 01/29/2018      Discharge Exam: Vitals:   01/31/18 0410 01/31/18 0859  BP: (!) 95/55 120/60  Pulse: 78   Resp: 20   Temp: 97.7 F (36.5 C)   SpO2: 100%     General: NAD Cardiovascular: RRR Respiratory: CTAB  Discharge  Instructions   Discharge Instructions    Diet general   Complete by:  As directed    Increase activity slowly   Complete by:  As directed      Allergies as of 01/31/2018      Reactions   Rituximab Swelling      Medication List    STOP taking these medications   ibuprofen 800 MG tablet Commonly known as:  ADVIL,MOTRIN   ketorolac 10 MG tablet Commonly known as:  TORADOL     TAKE these medications   clobetasol 0.05 % external solution Commonly known as:  TEMOVATE   clotrimazole 2 % vaginal cream Commonly known as:  GYNE-LOTRIMIN 3 Place 1 Applicatorful vaginally at bedtime for 7 days.   cyclobenzaprine 10 MG tablet Commonly known as:  FLEXERIL Take 10 mg by mouth 3 (three) times daily as needed for muscle spasms.   fluconazole 150 MG tablet Commonly known as:  DIFLUCAN Take 1 tablet (150 mg total) by mouth once for 1 dose.   ondansetron 4 MG tablet Commonly known as:  ZOFRAN Take 1 tablet (4 mg total) by mouth every 6 (six) hours as needed for nausea.   pantoprazole 40 MG tablet Commonly known as:  PROTONIX Take 1 tablet (40 mg total) by mouth daily.   polyethylene glycol packet Commonly known as:  MIRALAX / GLYCOLAX Take 17 g by mouth daily as needed for mild constipation.   traMADol 50 MG tablet Commonly known as:  ULTRAM Take 1-2 tablets (50-100 mg total) by mouth every 6 (six) hours as needed for moderate pain.      Allergies  Allergen Reactions  . Rituximab Swelling   Follow-up Information    Artis Delay, MD Follow up on 02/05/2018.   Specialty:  Hematology and Oncology Why:  f/u as scheduled. Contact information: [address] [place] [address] [phone]            The results of significant diagnostics from this hospitalization (including imaging, microbiology, ancillary and laboratory) are listed below for reference.    Significant Diagnostic Studies: No results found.  Microbiology: No results found for  this or any previous visit (from the past 240 hour(s)).   Labs: Basic Metabolic Panel: Recent Labs  Lab 01/29/18 1736 01/29/18 1752 01/31/18 0414  NA 140 141 134*  K 3.9 3.8 3.7  CL 104 102 104  CO2 28  --  24  GLUCOSE 99 94 107*  BUN 12 11 11   CREATININE 0.85 0.90 0.75  CALCIUM 9.4  --  8.4*  MG  --   --  1.8   Liver Function Tests: Recent Labs  Lab 01/29/18 1736  AST 20  ALT 14  ALKPHOS 86  BILITOT 0.6  PROT 8.2*  ALBUMIN 3.9   No results for input(s): LIPASE, AMYLASE in the last 168 hours. No results for input(s): AMMONIA in the last 168 hours. CBC: Recent Labs  Lab 01/29/18 1736 01/29/18 1752 01/30/18 0434 01/31/18 0414  WBC 9.8  --  10.6* 16.3*  NEUTROABS 6.0  --  9.3 13.5*  HGB 12.8 14.6 10.9* 9.8*  HCT 39.8 43.0  33.6* 31.0*  MCV 80.6  --  80.4 80.7  PLT 26*  --  42* 172   Cardiac Enzymes: No results for input(s): CKTOTAL, CKMB, CKMBINDEX, TROPONINI in the last 168 hours. BNP: BNP (last 3 results) No results for input(s): BNP in the last 8760 hours.  ProBNP (last 3 results) No results for input(s): PROBNP in the last 8760 hours.  CBG: No results for input(s): GLUCAP in the last 168 hours.     Signed:  Ramiro Harvestaniel Adasia Hoar MD.  [facility] 01/31/2018, 12:49 PM

## 2018-01-31 NOTE — Progress Notes (Signed)
Discharge instructions given. Pt verbalized understanding and all questions were answered.  

## 2018-02-01 ENCOUNTER — Telehealth: Payer: Self-pay | Admitting: Hematology and Oncology

## 2018-02-01 NOTE — Telephone Encounter (Signed)
Left message for patient regarding upcoming march appointments per 3/7 sch message.  °

## 2018-02-05 ENCOUNTER — Inpatient Hospital Stay: Payer: BLUE CROSS/BLUE SHIELD | Attending: Hematology and Oncology

## 2018-02-05 ENCOUNTER — Encounter: Payer: Self-pay | Admitting: Hematology and Oncology

## 2018-02-05 ENCOUNTER — Inpatient Hospital Stay (HOSPITAL_BASED_OUTPATIENT_CLINIC_OR_DEPARTMENT_OTHER): Payer: BLUE CROSS/BLUE SHIELD | Admitting: Hematology and Oncology

## 2018-02-05 DIAGNOSIS — N920 Excessive and frequent menstruation with regular cycle: Secondary | ICD-10-CM | POA: Diagnosis not present

## 2018-02-05 DIAGNOSIS — D509 Iron deficiency anemia, unspecified: Secondary | ICD-10-CM

## 2018-02-05 DIAGNOSIS — D693 Immune thrombocytopenic purpura: Secondary | ICD-10-CM | POA: Diagnosis not present

## 2018-02-05 DIAGNOSIS — D500 Iron deficiency anemia secondary to blood loss (chronic): Secondary | ICD-10-CM | POA: Insufficient documentation

## 2018-02-05 LAB — CBC WITH DIFFERENTIAL/PLATELET
BASOS PCT: 1 %
Basophils Absolute: 0.1 10*3/uL (ref 0.0–0.1)
Eosinophils Absolute: 0 10*3/uL (ref 0.0–0.5)
Eosinophils Relative: 0 %
HEMATOCRIT: 34 % — AB (ref 34.8–46.6)
Hemoglobin: 10.8 g/dL — ABNORMAL LOW (ref 11.6–15.9)
Lymphocytes Relative: 26 %
Lymphs Abs: 2.4 10*3/uL (ref 0.9–3.3)
MCH: 25.3 pg (ref 25.1–34.0)
MCHC: 31.7 g/dL (ref 31.5–36.0)
MCV: 79.6 fL (ref 79.5–101.0)
MONO ABS: 1 10*3/uL — AB (ref 0.1–0.9)
MONOS PCT: 11 %
NEUTROS ABS: 5.8 10*3/uL (ref 1.5–6.5)
Neutrophils Relative %: 62 %
Platelets: 833 10*3/uL — ABNORMAL HIGH (ref 145–400)
RBC: 4.27 MIL/uL (ref 3.70–5.45)
RDW: 15.4 % — AB (ref 11.2–14.5)
WBC: 9.4 10*3/uL (ref 3.9–10.3)

## 2018-02-05 NOTE — Assessment & Plan Note (Signed)
Recent menorrhagia is likely due to relapse of ITP Since then, her menstruation has stopped She also has symptoms of back pain could indicate possible endometriosis We did not pursue further evaluation for menorrhagia We discussed dietary modification and weight loss that may help with menorrhagia

## 2018-02-05 NOTE — Progress Notes (Signed)
[name] OFFICE PROGRESS NOTE  [name], Elaine, MD  ASSESSMENT & PLAN:  Idiopathic thrombocytopenic purpura (HCC) The patient have recurrent, relapsed ITP recently, triggered by viral infection Since recent IVIG and prednisone therapy, ITP has resolved We discussed signs and symptoms to watch out for recurrence I have no plan to bring her back for follow up In the future, to reduce his side effects from prednisone therapy, I plan to Prednisone at 60 mg total dose and not higher.  She agree with the plan of care  Iron deficiency anemia She has recent severe menorrhagia secondary to ITP and was found to have mild iron deficiency anemia Her anemia is already improving I do not feel strongly she needs iron supplementation Recommend iron rich diet If her menorrhagia recur, I have advised the patient to call me immediately for further follow-up in case she needed further evaluation and treatment for severe anemia or recurrent ITP  Menorrhagia Recent menorrhagia is likely due to relapse of ITP Since then, her menstruation has stopped She also has symptoms of back pain could indicate possible endometriosis We did not pursue further evaluation for menorrhagia We discussed dietary modification and weight loss that may help with menorrhagia   No orders of the defined types were placed in this encounter.   INTERVAL HISTORY: [name] 37 y.o. female returns for further follow-up. Her menstruation has stopped since recent discharge from the hospital She complained of severe headaches and migraine since discharge from the hospital She complained of minimal back pain with recent menstruation that has also improved The patient denies any recent signs or symptoms of bleeding such as spontaneous epistaxis, hematuria or hematochezia. She denies recent infection, fever or chills  She complained of minimum throat pain.  She was treated with a dose of fluconazole for possible  yeast infection  SUMMARY OF HEMATOLOGIC HISTORY:  This is a very pleasant lady with history of chronic ITP. 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 She responded well to prednisone but due to the excessive weight gain and other side effects, prefer not to be treated with prednisone. When she was treated with rituximab before 2010, she did develop significant allergic reaction. Her last treatment with IVIG was in 2011. Currently she's been placed on observation She was admitted to the hospital from 01/26/2016 to 01/28/2016 with recurrence of ITP. She received IVIG and high-dose dexamethasone with improvement of her platelet count The patient had relapse of ITP, hospitalized from 03/20/2017 to 03/23/2017 with IVIG and steroids treatment.  This episode was triggered by stress and recent influenza infection further follow-up. From 01/29/2018 to 01/31/2018, she was readmitted hospital for recurrent ITP, responded to prednisone therapy and IVIG.  The episode was preceded by influenza infection 7-10 days prior to the admission  I have reviewed the past medical history, past surgical history, social history and family history with the patient and they are unchanged from previous note.  ALLERGIES:  is allergic to rituximab.  MEDICATIONS:  Current Outpatient Medications  Medication Sig Dispense Refill  . clobetasol (TEMOVATE) 0.05 % external solution   0  . clotrimazole (GYNE-LOTRIMIN 3) 2 % vaginal cream Place 1 Applicatorful vaginally at bedtime for 7 days. 21 g 0  . cyclobenzaprine (FLEXERIL) 10 MG tablet Take 10 mg by mouth 3 (three) times daily as needed for muscle spasms.    . ondansetron (ZOFRAN) 4 MG tablet Take 1 tablet (4 mg total) by mouth every 6 (  six) hours as needed for nausea. 20 tablet 0  . pantoprazole (PROTONIX) 40 MG tablet Take 1 tablet (40 mg total) by mouth daily. 30 tablet 0  . polyethylene glycol (MIRALAX / GLYCOLAX) packet Take 17 g by mouth  daily as needed for mild constipation. 14 each 0  . traMADol (ULTRAM) 50 MG tablet Take 1-2 tablets (50-100 mg total) by mouth every 6 (six) hours as needed for moderate pain. 20 tablet 0   No current facility-administered medications for this visit.      REVIEW OF SYSTEMS:   Constitutional: Denies fevers, chills or night sweats Eyes: Denies blurriness of vision Respiratory: Denies cough, dyspnea or wheezes Cardiovascular: Denies palpitation, chest discomfort or lower extremity swelling Gastrointestinal:  Denies nausea, heartburn or change in bowel habits Skin: Denies abnormal skin rashes Lymphatics: Denies new lymphadenopathy or easy bruising Neurological:Denies numbness, tingling or new weaknesses Behavioral/Psych: Mood is stable, no new changes  All other systems were reviewed with the patient and are negative.  PHYSICAL EXAMINATION: ECOG PERFORMANCE STATUS: 0 - Asymptomatic  Vitals:   02/05/18 1455  BP: 132/71  Pulse: 89  Resp: 18  Temp: 97.9 F (36.6 C)  SpO2: 100%   Filed Weights   02/05/18 1455  Weight: 202 lb 12.8 oz (92 kg)    GENERAL:alert, no distress and comfortable SKIN: skin color, texture, turgor are normal, no rashes or significant lesions ENT: No signs of mucositis or thursh NEURO: alert & oriented x 3 with fluent speech, no focal motor/sensory deficits  LABORATORY DATA:  I have reviewed the data as listed     Component Value Date/Time   NA 134 (L) 01/31/2018 0414   NA 139 08/27/2013 1002   K 3.7 01/31/2018 0414   K 3.8 08/27/2013 1002   CL 104 01/31/2018 0414   CL 105 12/12/2012 1545   CO2 24 01/31/2018 0414   CO2 26 08/27/2013 1002   GLUCOSE 107 (H) 01/31/2018 0414   GLUCOSE 79 08/27/2013 1002   GLUCOSE 91 12/12/2012 1545   BUN 11 01/31/2018 0414   BUN 8.1 08/27/2013 1002   CREATININE 0.75 01/31/2018 0414   CREATININE 0.8 08/27/2013 1002   CALCIUM 8.4 (L) 01/31/2018 0414   CALCIUM 9.4 08/27/2013 1002   PROT 8.2 (H) 01/29/2018 1736    PROT 7.8 08/27/2013 1002   ALBUMIN 3.9 01/29/2018 1736   ALBUMIN 3.6 08/27/2013 1002   AST 20 01/29/2018 1736   AST 15 08/27/2013 1002   ALT 14 01/29/2018 1736   ALT 6 08/27/2013 1002   ALKPHOS 86 01/29/2018 1736   ALKPHOS 77 08/27/2013 1002   BILITOT 0.6 01/29/2018 1736   BILITOT 0.62 08/27/2013 1002   GFRNONAA >60 01/31/2018 0414   GFRAA >60 01/31/2018 0414    No results found for: SPEP, UPEP  Lab Results  Component Value Date   WBC 9.4 02/05/2018   NEUTROABS 5.8 02/05/2018   HGB 10.8 (L) 02/05/2018   HCT 34.0 (L) 02/05/2018   MCV 79.6 02/05/2018   PLT 833 (H) 02/05/2018      Chemistry      Component Value Date/Time   NA 134 (L) 01/31/2018 0414   NA 139 08/27/2013 1002   K 3.7 01/31/2018 0414   K 3.8 08/27/2013 1002   CL 104 01/31/2018 0414   CL 105 12/12/2012 1545   CO2 24 01/31/2018 0414   CO2 26 08/27/2013 1002   BUN 11 01/31/2018 0414   BUN 8.1 08/27/2013 1002   CREATININE 0.75 01/31/2018 0414  CREATININE 0.8 08/27/2013 1002      Component Value Date/Time   CALCIUM 8.4 (L) 01/31/2018 0414   CALCIUM 9.4 08/27/2013 1002   ALKPHOS 86 01/29/2018 1736   ALKPHOS 77 08/27/2013 1002   AST 20 01/29/2018 1736   AST 15 08/27/2013 1002   ALT 14 01/29/2018 1736   ALT 6 08/27/2013 1002   BILITOT 0.6 01/29/2018 1736   BILITOT 0.62 08/27/2013 1002      I spent 15 minutes counseling the patient face to face. The total time spent in the appointment was 20 minutes and more than 50% was on counseling.   All questions were answered. The patient knows to call the clinic with any problems, questions or concerns. No barriers to learning was detected.    Artis Delay, MD 3/12/20194:00 PM

## 2018-02-05 NOTE — Assessment & Plan Note (Signed)
She has recent severe menorrhagia secondary to ITP and was found to have mild iron deficiency anemia Her anemia is already improving I do not feel strongly she needs iron supplementation Recommend iron rich diet If her menorrhagia recur, I have advised the patient to call me immediately for further follow-up in case she needed further evaluation and treatment for severe anemia or recurrent ITP

## 2018-02-05 NOTE — Assessment & Plan Note (Signed)
The patient have recurrent, relapsed ITP recently, triggered by viral infection Since recent IVIG and prednisone therapy, ITP has resolved We discussed signs and symptoms to watch out for recurrence I have no plan to bring her back for follow up In the future, to reduce his side effects from prednisone therapy, I plan to Prednisone at 60 mg total dose and not higher.  She agree with the plan of care

## 2018-07-12 DIAGNOSIS — L400 Psoriasis vulgaris: Secondary | ICD-10-CM | POA: Diagnosis not present

## 2018-07-12 DIAGNOSIS — L700 Acne vulgaris: Secondary | ICD-10-CM | POA: Diagnosis not present

## 2018-09-12 DIAGNOSIS — D693 Immune thrombocytopenic purpura: Secondary | ICD-10-CM | POA: Diagnosis not present

## 2018-09-12 DIAGNOSIS — M2550 Pain in unspecified joint: Secondary | ICD-10-CM | POA: Diagnosis not present

## 2018-09-12 DIAGNOSIS — Z409 Encounter for prophylactic surgery, unspecified: Secondary | ICD-10-CM | POA: Diagnosis not present

## 2018-09-12 DIAGNOSIS — R5383 Other fatigue: Secondary | ICD-10-CM | POA: Diagnosis not present

## 2018-09-12 DIAGNOSIS — L409 Psoriasis, unspecified: Secondary | ICD-10-CM | POA: Diagnosis not present

## 2018-10-02 DIAGNOSIS — Z01419 Encounter for gynecological examination (general) (routine) without abnormal findings: Secondary | ICD-10-CM | POA: Diagnosis not present

## 2018-10-02 DIAGNOSIS — Z23 Encounter for immunization: Secondary | ICD-10-CM | POA: Diagnosis not present

## 2018-10-16 DIAGNOSIS — L400 Psoriasis vulgaris: Secondary | ICD-10-CM | POA: Diagnosis not present

## 2018-10-16 DIAGNOSIS — L700 Acne vulgaris: Secondary | ICD-10-CM | POA: Diagnosis not present

## 2018-12-24 DIAGNOSIS — L409 Psoriasis, unspecified: Secondary | ICD-10-CM | POA: Diagnosis not present

## 2018-12-24 DIAGNOSIS — D693 Immune thrombocytopenic purpura: Secondary | ICD-10-CM | POA: Diagnosis not present

## 2018-12-24 DIAGNOSIS — M2550 Pain in unspecified joint: Secondary | ICD-10-CM | POA: Diagnosis not present

## 2018-12-30 DIAGNOSIS — D693 Immune thrombocytopenic purpura: Secondary | ICD-10-CM | POA: Diagnosis not present

## 2018-12-30 DIAGNOSIS — H9202 Otalgia, left ear: Secondary | ICD-10-CM | POA: Diagnosis not present

## 2019-01-08 DIAGNOSIS — L400 Psoriasis vulgaris: Secondary | ICD-10-CM | POA: Diagnosis not present

## 2019-01-08 DIAGNOSIS — L700 Acne vulgaris: Secondary | ICD-10-CM | POA: Diagnosis not present

## 2019-03-14 DIAGNOSIS — J309 Allergic rhinitis, unspecified: Secondary | ICD-10-CM | POA: Diagnosis not present

## 2019-03-14 DIAGNOSIS — B349 Viral infection, unspecified: Secondary | ICD-10-CM | POA: Diagnosis not present

## 2019-03-14 DIAGNOSIS — D693 Immune thrombocytopenic purpura: Secondary | ICD-10-CM | POA: Diagnosis not present

## 2019-04-09 DIAGNOSIS — L700 Acne vulgaris: Secondary | ICD-10-CM | POA: Diagnosis not present

## 2019-04-09 DIAGNOSIS — L400 Psoriasis vulgaris: Secondary | ICD-10-CM | POA: Diagnosis not present

## 2019-04-30 DIAGNOSIS — Z1159 Encounter for screening for other viral diseases: Secondary | ICD-10-CM | POA: Diagnosis not present

## 2019-04-30 DIAGNOSIS — J309 Allergic rhinitis, unspecified: Secondary | ICD-10-CM | POA: Diagnosis not present

## 2019-05-26 DIAGNOSIS — R350 Frequency of micturition: Secondary | ICD-10-CM | POA: Diagnosis not present

## 2019-05-26 DIAGNOSIS — D693 Immune thrombocytopenic purpura: Secondary | ICD-10-CM | POA: Diagnosis not present

## 2019-10-07 ENCOUNTER — Other Ambulatory Visit: Payer: Self-pay | Admitting: Obstetrics and Gynecology

## 2019-10-07 ENCOUNTER — Other Ambulatory Visit (HOSPITAL_COMMUNITY)
Admission: RE | Admit: 2019-10-07 | Discharge: 2019-10-07 | Disposition: A | Discharge status: Home / Self Care | Payer: BC Managed Care – PPO | Source: Ambulatory Visit | Attending: Obstetrics and Gynecology | Admitting: Obstetrics and Gynecology

## 2019-10-07 DIAGNOSIS — N946 Dysmenorrhea, unspecified: Secondary | ICD-10-CM | POA: Diagnosis not present

## 2019-10-07 DIAGNOSIS — R102 Pelvic and perineal pain: Secondary | ICD-10-CM | POA: Diagnosis not present

## 2019-10-07 DIAGNOSIS — Z01419 Encounter for gynecological examination (general) (routine) without abnormal findings: Secondary | ICD-10-CM | POA: Insufficient documentation

## 2019-10-09 LAB — MOLECULAR ANCILLARY ONLY
Comment: NEGATIVE
High risk HPV: NEGATIVE
Neisseria Gonorrhea: NEGATIVE

## 2019-10-09 LAB — MOLECULAR ANCILLARY ONLY???
Chlamydia: NEGATIVE
Comment: NEGATIVE
Comment: NORMAL

## 2019-10-13 DIAGNOSIS — Z20828 Contact with and (suspected) exposure to other viral communicable diseases: Secondary | ICD-10-CM | POA: Diagnosis not present

## 2019-10-21 DIAGNOSIS — R102 Pelvic and perineal pain: Secondary | ICD-10-CM | POA: Diagnosis not present

## 2019-10-21 DIAGNOSIS — N946 Dysmenorrhea, unspecified: Secondary | ICD-10-CM | POA: Diagnosis not present

## 2019-11-27 DIAGNOSIS — R300 Dysuria: Secondary | ICD-10-CM | POA: Diagnosis not present

## 2019-11-27 DIAGNOSIS — Z20828 Contact with and (suspected) exposure to other viral communicable diseases: Secondary | ICD-10-CM | POA: Diagnosis not present

## 2019-11-27 DIAGNOSIS — B349 Viral infection, unspecified: Secondary | ICD-10-CM | POA: Diagnosis not present

## 2019-12-01 DIAGNOSIS — Z20828 Contact with and (suspected) exposure to other viral communicable diseases: Secondary | ICD-10-CM | POA: Diagnosis not present

## 2019-12-12 DIAGNOSIS — R300 Dysuria: Secondary | ICD-10-CM | POA: Diagnosis not present

## 2019-12-12 DIAGNOSIS — R319 Hematuria, unspecified: Secondary | ICD-10-CM | POA: Diagnosis not present

## 2019-12-12 DIAGNOSIS — L409 Psoriasis, unspecified: Secondary | ICD-10-CM | POA: Diagnosis not present

## 2019-12-12 DIAGNOSIS — D692 Other nonthrombocytopenic purpura: Secondary | ICD-10-CM | POA: Diagnosis not present

## 2020-01-29 DIAGNOSIS — D696 Thrombocytopenia, unspecified: Secondary | ICD-10-CM | POA: Diagnosis not present

## 2020-01-29 DIAGNOSIS — Z9081 Acquired absence of spleen: Secondary | ICD-10-CM | POA: Diagnosis not present

## 2020-01-29 DIAGNOSIS — L409 Psoriasis, unspecified: Secondary | ICD-10-CM | POA: Diagnosis not present

## 2020-03-01 DIAGNOSIS — D508 Other iron deficiency anemias: Secondary | ICD-10-CM | POA: Diagnosis not present

## 2020-03-01 DIAGNOSIS — D696 Thrombocytopenia, unspecified: Secondary | ICD-10-CM | POA: Diagnosis not present

## 2020-03-04 DIAGNOSIS — Z20822 Contact with and (suspected) exposure to covid-19: Secondary | ICD-10-CM | POA: Diagnosis not present

## 2020-04-08 DIAGNOSIS — L400 Psoriasis vulgaris: Secondary | ICD-10-CM | POA: Diagnosis not present

## 2020-04-08 DIAGNOSIS — L700 Acne vulgaris: Secondary | ICD-10-CM | POA: Diagnosis not present

## 2020-05-05 DIAGNOSIS — D696 Thrombocytopenia, unspecified: Secondary | ICD-10-CM | POA: Diagnosis not present

## 2020-06-22 DIAGNOSIS — B373 Candidiasis of vulva and vagina: Secondary | ICD-10-CM | POA: Diagnosis not present

## 2020-06-22 DIAGNOSIS — N3090 Cystitis, unspecified without hematuria: Secondary | ICD-10-CM | POA: Diagnosis not present

## 2020-06-22 DIAGNOSIS — R350 Frequency of micturition: Secondary | ICD-10-CM | POA: Diagnosis not present

## 2020-07-15 DIAGNOSIS — L400 Psoriasis vulgaris: Secondary | ICD-10-CM | POA: Diagnosis not present

## 2020-07-15 DIAGNOSIS — Z79899 Other long term (current) drug therapy: Secondary | ICD-10-CM | POA: Diagnosis not present

## 2020-07-15 DIAGNOSIS — L700 Acne vulgaris: Secondary | ICD-10-CM | POA: Diagnosis not present

## 2020-08-03 DIAGNOSIS — B349 Viral infection, unspecified: Secondary | ICD-10-CM | POA: Diagnosis not present

## 2020-08-03 DIAGNOSIS — Z1152 Encounter for screening for COVID-19: Secondary | ICD-10-CM | POA: Diagnosis not present

## 2020-08-03 DIAGNOSIS — Z20822 Contact with and (suspected) exposure to covid-19: Secondary | ICD-10-CM | POA: Diagnosis not present

## 2020-12-30 DIAGNOSIS — D696 Thrombocytopenia, unspecified: Secondary | ICD-10-CM | POA: Diagnosis not present

## 2021-01-07 DIAGNOSIS — Z01419 Encounter for gynecological examination (general) (routine) without abnormal findings: Secondary | ICD-10-CM | POA: Diagnosis not present

## 2021-01-27 DIAGNOSIS — L700 Acne vulgaris: Secondary | ICD-10-CM | POA: Diagnosis not present

## 2021-01-27 DIAGNOSIS — L400 Psoriasis vulgaris: Secondary | ICD-10-CM | POA: Diagnosis not present

## 2021-03-21 DIAGNOSIS — A499 Bacterial infection, unspecified: Secondary | ICD-10-CM | POA: Diagnosis not present

## 2021-03-21 DIAGNOSIS — Z20822 Contact with and (suspected) exposure to covid-19: Secondary | ICD-10-CM | POA: Diagnosis not present

## 2021-03-21 DIAGNOSIS — J019 Acute sinusitis, unspecified: Secondary | ICD-10-CM | POA: Diagnosis not present

## 2021-08-05 DIAGNOSIS — J069 Acute upper respiratory infection, unspecified: Secondary | ICD-10-CM | POA: Diagnosis not present

## 2021-08-05 DIAGNOSIS — R0989 Other specified symptoms and signs involving the circulatory and respiratory systems: Secondary | ICD-10-CM | POA: Diagnosis not present

## 2021-08-05 DIAGNOSIS — J029 Acute pharyngitis, unspecified: Secondary | ICD-10-CM | POA: Diagnosis not present

## 2021-08-05 DIAGNOSIS — D696 Thrombocytopenia, unspecified: Secondary | ICD-10-CM | POA: Diagnosis not present

## 2021-08-05 DIAGNOSIS — Z79899 Other long term (current) drug therapy: Secondary | ICD-10-CM | POA: Diagnosis not present

## 2021-08-05 DIAGNOSIS — R059 Cough, unspecified: Secondary | ICD-10-CM | POA: Diagnosis not present

## 2021-08-05 DIAGNOSIS — Z20822 Contact with and (suspected) exposure to covid-19: Secondary | ICD-10-CM | POA: Diagnosis not present

## 2021-08-05 DIAGNOSIS — R519 Headache, unspecified: Secondary | ICD-10-CM | POA: Diagnosis not present

## 2021-08-09 DIAGNOSIS — R21 Rash and other nonspecific skin eruption: Secondary | ICD-10-CM | POA: Diagnosis not present

## 2021-08-09 DIAGNOSIS — R799 Abnormal finding of blood chemistry, unspecified: Secondary | ICD-10-CM | POA: Diagnosis not present

## 2021-08-09 DIAGNOSIS — Z79899 Other long term (current) drug therapy: Secondary | ICD-10-CM | POA: Diagnosis not present

## 2021-08-09 DIAGNOSIS — K625 Hemorrhage of anus and rectum: Secondary | ICD-10-CM | POA: Diagnosis not present

## 2021-08-09 DIAGNOSIS — D696 Thrombocytopenia, unspecified: Secondary | ICD-10-CM | POA: Diagnosis not present

## 2021-08-09 DIAGNOSIS — D509 Iron deficiency anemia, unspecified: Secondary | ICD-10-CM | POA: Diagnosis not present

## 2021-08-09 DIAGNOSIS — Z9081 Acquired absence of spleen: Secondary | ICD-10-CM | POA: Diagnosis not present

## 2021-08-09 DIAGNOSIS — G44209 Tension-type headache, unspecified, not intractable: Secondary | ICD-10-CM | POA: Diagnosis not present

## 2021-08-09 DIAGNOSIS — Z888 Allergy status to other drugs, medicaments and biological substances status: Secondary | ICD-10-CM | POA: Diagnosis not present

## 2021-08-09 DIAGNOSIS — D693 Immune thrombocytopenic purpura: Secondary | ICD-10-CM | POA: Diagnosis not present

## 2021-08-09 DIAGNOSIS — E876 Hypokalemia: Secondary | ICD-10-CM | POA: Diagnosis not present

## 2021-08-09 DIAGNOSIS — D508 Other iron deficiency anemias: Secondary | ICD-10-CM | POA: Diagnosis not present

## 2021-08-09 DIAGNOSIS — K921 Melena: Secondary | ICD-10-CM | POA: Diagnosis not present

## 2021-08-10 DIAGNOSIS — D508 Other iron deficiency anemias: Secondary | ICD-10-CM | POA: Diagnosis not present

## 2021-08-10 DIAGNOSIS — E876 Hypokalemia: Secondary | ICD-10-CM | POA: Diagnosis not present

## 2021-08-10 DIAGNOSIS — Z9081 Acquired absence of spleen: Secondary | ICD-10-CM | POA: Diagnosis not present

## 2021-08-10 DIAGNOSIS — D693 Immune thrombocytopenic purpura: Secondary | ICD-10-CM | POA: Diagnosis not present

## 2021-08-10 DIAGNOSIS — R059 Cough, unspecified: Secondary | ICD-10-CM | POA: Diagnosis not present

## 2021-08-10 DIAGNOSIS — K625 Hemorrhage of anus and rectum: Secondary | ICD-10-CM | POA: Diagnosis not present

## 2021-08-11 DIAGNOSIS — D693 Immune thrombocytopenic purpura: Secondary | ICD-10-CM | POA: Diagnosis not present

## 2021-08-11 DIAGNOSIS — D508 Other iron deficiency anemias: Secondary | ICD-10-CM | POA: Diagnosis not present

## 2021-08-11 DIAGNOSIS — E876 Hypokalemia: Secondary | ICD-10-CM | POA: Diagnosis not present

## 2021-08-11 DIAGNOSIS — Z9081 Acquired absence of spleen: Secondary | ICD-10-CM | POA: Diagnosis not present

## 2021-08-11 DIAGNOSIS — K625 Hemorrhage of anus and rectum: Secondary | ICD-10-CM | POA: Diagnosis not present

## 2021-08-15 DIAGNOSIS — D696 Thrombocytopenia, unspecified: Secondary | ICD-10-CM | POA: Diagnosis not present

## 2021-08-24 DIAGNOSIS — D508 Other iron deficiency anemias: Secondary | ICD-10-CM | POA: Diagnosis not present

## 2021-08-24 DIAGNOSIS — D696 Thrombocytopenia, unspecified: Secondary | ICD-10-CM | POA: Diagnosis not present

## 2021-08-29 DIAGNOSIS — D693 Immune thrombocytopenic purpura: Secondary | ICD-10-CM | POA: Diagnosis not present

## 2021-08-29 DIAGNOSIS — D696 Thrombocytopenia, unspecified: Secondary | ICD-10-CM | POA: Diagnosis not present

## 2021-09-05 DIAGNOSIS — D508 Other iron deficiency anemias: Secondary | ICD-10-CM | POA: Diagnosis not present

## 2021-09-05 DIAGNOSIS — D693 Immune thrombocytopenic purpura: Secondary | ICD-10-CM | POA: Diagnosis not present

## 2021-09-12 DIAGNOSIS — D72829 Elevated white blood cell count, unspecified: Secondary | ICD-10-CM | POA: Diagnosis not present

## 2021-09-12 DIAGNOSIS — D693 Immune thrombocytopenic purpura: Secondary | ICD-10-CM | POA: Diagnosis not present

## 2021-09-19 DIAGNOSIS — D508 Other iron deficiency anemias: Secondary | ICD-10-CM | POA: Diagnosis not present

## 2021-09-19 DIAGNOSIS — D693 Immune thrombocytopenic purpura: Secondary | ICD-10-CM | POA: Diagnosis not present

## 2021-09-26 DIAGNOSIS — D693 Immune thrombocytopenic purpura: Secondary | ICD-10-CM | POA: Diagnosis not present

## 2021-10-25 DIAGNOSIS — D693 Immune thrombocytopenic purpura: Secondary | ICD-10-CM | POA: Diagnosis not present

## 2021-11-24 DIAGNOSIS — D508 Other iron deficiency anemias: Secondary | ICD-10-CM | POA: Diagnosis not present

## 2021-11-24 DIAGNOSIS — D693 Immune thrombocytopenic purpura: Secondary | ICD-10-CM | POA: Diagnosis not present

## 2021-12-21 DIAGNOSIS — D508 Other iron deficiency anemias: Secondary | ICD-10-CM | POA: Diagnosis not present

## 2021-12-21 DIAGNOSIS — D693 Immune thrombocytopenic purpura: Secondary | ICD-10-CM | POA: Diagnosis not present

## 2022-01-23 DIAGNOSIS — D693 Immune thrombocytopenic purpura: Secondary | ICD-10-CM | POA: Diagnosis not present

## 2022-02-07 DIAGNOSIS — Z01419 Encounter for gynecological examination (general) (routine) without abnormal findings: Secondary | ICD-10-CM | POA: Diagnosis not present

## 2022-02-07 DIAGNOSIS — N939 Abnormal uterine and vaginal bleeding, unspecified: Secondary | ICD-10-CM | POA: Diagnosis not present

## 2022-02-07 DIAGNOSIS — N946 Dysmenorrhea, unspecified: Secondary | ICD-10-CM | POA: Diagnosis not present

## 2022-02-07 DIAGNOSIS — R61 Generalized hyperhidrosis: Secondary | ICD-10-CM | POA: Diagnosis not present

## 2022-02-15 DIAGNOSIS — D693 Immune thrombocytopenic purpura: Secondary | ICD-10-CM | POA: Diagnosis not present

## 2022-03-13 DIAGNOSIS — L4050 Arthropathic psoriasis, unspecified: Secondary | ICD-10-CM | POA: Diagnosis not present

## 2022-03-13 DIAGNOSIS — D508 Other iron deficiency anemias: Secondary | ICD-10-CM | POA: Diagnosis not present

## 2022-03-13 DIAGNOSIS — D693 Immune thrombocytopenic purpura: Secondary | ICD-10-CM | POA: Diagnosis not present

## 2022-03-13 DIAGNOSIS — D696 Thrombocytopenia, unspecified: Secondary | ICD-10-CM | POA: Diagnosis not present

## 2022-03-13 DIAGNOSIS — K30 Functional dyspepsia: Secondary | ICD-10-CM | POA: Diagnosis not present

## 2022-03-22 DIAGNOSIS — K30 Functional dyspepsia: Secondary | ICD-10-CM | POA: Diagnosis not present

## 2022-05-15 DIAGNOSIS — L409 Psoriasis, unspecified: Secondary | ICD-10-CM | POA: Diagnosis not present

## 2022-05-15 DIAGNOSIS — D84821 Immunodeficiency due to drugs: Secondary | ICD-10-CM | POA: Diagnosis not present

## 2022-05-15 DIAGNOSIS — Z79899 Other long term (current) drug therapy: Secondary | ICD-10-CM | POA: Diagnosis not present

## 2022-05-15 DIAGNOSIS — L4050 Arthropathic psoriasis, unspecified: Secondary | ICD-10-CM | POA: Diagnosis not present

## 2022-05-16 DIAGNOSIS — D693 Immune thrombocytopenic purpura: Secondary | ICD-10-CM | POA: Diagnosis not present

## 2022-07-06 DIAGNOSIS — Z20822 Contact with and (suspected) exposure to covid-19: Secondary | ICD-10-CM | POA: Diagnosis not present

## 2022-07-06 DIAGNOSIS — R5383 Other fatigue: Secondary | ICD-10-CM | POA: Diagnosis not present

## 2022-07-06 DIAGNOSIS — Z3202 Encounter for pregnancy test, result negative: Secondary | ICD-10-CM | POA: Diagnosis not present

## 2022-07-06 DIAGNOSIS — N3000 Acute cystitis without hematuria: Secondary | ICD-10-CM | POA: Diagnosis not present

## 2022-07-17 DIAGNOSIS — D693 Immune thrombocytopenic purpura: Secondary | ICD-10-CM | POA: Diagnosis not present

## 2022-09-04 DIAGNOSIS — L400 Psoriasis vulgaris: Secondary | ICD-10-CM | POA: Diagnosis not present

## 2022-09-04 DIAGNOSIS — L700 Acne vulgaris: Secondary | ICD-10-CM | POA: Diagnosis not present

## 2022-09-14 DIAGNOSIS — Z79899 Other long term (current) drug therapy: Secondary | ICD-10-CM | POA: Diagnosis not present

## 2022-09-14 DIAGNOSIS — L4050 Arthropathic psoriasis, unspecified: Secondary | ICD-10-CM | POA: Diagnosis not present

## 2022-09-14 DIAGNOSIS — D84821 Immunodeficiency due to drugs: Secondary | ICD-10-CM | POA: Diagnosis not present

## 2022-09-19 DIAGNOSIS — D693 Immune thrombocytopenic purpura: Secondary | ICD-10-CM | POA: Diagnosis not present

## 2022-10-12 DIAGNOSIS — D693 Immune thrombocytopenic purpura: Secondary | ICD-10-CM | POA: Diagnosis not present

## 2022-10-12 DIAGNOSIS — Z9081 Acquired absence of spleen: Secondary | ICD-10-CM | POA: Diagnosis not present

## 2022-10-12 DIAGNOSIS — E876 Hypokalemia: Secondary | ICD-10-CM | POA: Diagnosis not present

## 2022-10-12 DIAGNOSIS — D508 Other iron deficiency anemias: Secondary | ICD-10-CM | POA: Diagnosis not present

## 2022-10-20 DIAGNOSIS — D508 Other iron deficiency anemias: Secondary | ICD-10-CM | POA: Diagnosis not present

## 2022-10-20 DIAGNOSIS — D693 Immune thrombocytopenic purpura: Secondary | ICD-10-CM | POA: Diagnosis not present

## 2022-10-20 DIAGNOSIS — D696 Thrombocytopenia, unspecified: Secondary | ICD-10-CM | POA: Diagnosis not present

## 2022-12-13 DIAGNOSIS — D693 Immune thrombocytopenic purpura: Secondary | ICD-10-CM | POA: Diagnosis not present

## 2022-12-13 DIAGNOSIS — Z0000 Encounter for general adult medical examination without abnormal findings: Secondary | ICD-10-CM | POA: Diagnosis not present

## 2022-12-13 DIAGNOSIS — Z13220 Encounter for screening for lipoid disorders: Secondary | ICD-10-CM | POA: Diagnosis not present

## 2022-12-13 DIAGNOSIS — N926 Irregular menstruation, unspecified: Secondary | ICD-10-CM | POA: Diagnosis not present

## 2022-12-19 DIAGNOSIS — D693 Immune thrombocytopenic purpura: Secondary | ICD-10-CM | POA: Diagnosis not present

## 2022-12-28 DIAGNOSIS — L409 Psoriasis, unspecified: Secondary | ICD-10-CM | POA: Diagnosis not present

## 2022-12-28 DIAGNOSIS — D84821 Immunodeficiency due to drugs: Secondary | ICD-10-CM | POA: Diagnosis not present

## 2022-12-28 DIAGNOSIS — Z79899 Other long term (current) drug therapy: Secondary | ICD-10-CM | POA: Diagnosis not present

## 2022-12-28 DIAGNOSIS — L4050 Arthropathic psoriasis, unspecified: Secondary | ICD-10-CM | POA: Diagnosis not present

## 2023-01-03 DIAGNOSIS — M2241 Chondromalacia patellae, right knee: Secondary | ICD-10-CM | POA: Diagnosis not present

## 2023-01-03 DIAGNOSIS — L4050 Arthropathic psoriasis, unspecified: Secondary | ICD-10-CM | POA: Diagnosis not present

## 2023-01-03 DIAGNOSIS — S83011A Lateral subluxation of right patella, initial encounter: Secondary | ICD-10-CM | POA: Diagnosis not present

## 2023-02-20 DIAGNOSIS — N946 Dysmenorrhea, unspecified: Secondary | ICD-10-CM | POA: Diagnosis not present

## 2023-02-20 DIAGNOSIS — N923 Ovulation bleeding: Secondary | ICD-10-CM | POA: Diagnosis not present

## 2023-02-20 DIAGNOSIS — N841 Polyp of cervix uteri: Secondary | ICD-10-CM | POA: Diagnosis not present

## 2023-02-20 DIAGNOSIS — Z01419 Encounter for gynecological examination (general) (routine) without abnormal findings: Secondary | ICD-10-CM | POA: Diagnosis not present

## 2023-03-14 ENCOUNTER — Other Ambulatory Visit: Payer: Self-pay

## 2023-03-14 DIAGNOSIS — N923 Ovulation bleeding: Secondary | ICD-10-CM | POA: Diagnosis not present

## 2023-03-14 DIAGNOSIS — Z3202 Encounter for pregnancy test, result negative: Secondary | ICD-10-CM | POA: Diagnosis not present

## 2023-03-14 DIAGNOSIS — N841 Polyp of cervix uteri: Secondary | ICD-10-CM | POA: Diagnosis not present

## 2023-03-14 DIAGNOSIS — N946 Dysmenorrhea, unspecified: Secondary | ICD-10-CM | POA: Diagnosis not present

## 2023-03-14 DIAGNOSIS — N9489 Other specified conditions associated with female genital organs and menstrual cycle: Secondary | ICD-10-CM | POA: Diagnosis not present

## 2023-03-20 DIAGNOSIS — D693 Immune thrombocytopenic purpura: Secondary | ICD-10-CM | POA: Diagnosis not present

## 2023-03-22 DIAGNOSIS — L700 Acne vulgaris: Secondary | ICD-10-CM | POA: Diagnosis not present

## 2023-03-22 DIAGNOSIS — L400 Psoriasis vulgaris: Secondary | ICD-10-CM | POA: Diagnosis not present

## 2023-03-27 DIAGNOSIS — J019 Acute sinusitis, unspecified: Secondary | ICD-10-CM | POA: Diagnosis not present

## 2023-03-27 DIAGNOSIS — B9689 Other specified bacterial agents as the cause of diseases classified elsewhere: Secondary | ICD-10-CM | POA: Diagnosis not present

## 2023-04-02 DIAGNOSIS — D696 Thrombocytopenia, unspecified: Secondary | ICD-10-CM | POA: Diagnosis not present

## 2023-04-05 DIAGNOSIS — D693 Immune thrombocytopenic purpura: Secondary | ICD-10-CM | POA: Diagnosis not present

## 2023-05-08 DIAGNOSIS — R300 Dysuria: Secondary | ICD-10-CM | POA: Diagnosis not present

## 2023-05-30 DIAGNOSIS — L4050 Arthropathic psoriasis, unspecified: Secondary | ICD-10-CM | POA: Diagnosis not present

## 2023-05-30 DIAGNOSIS — D84821 Immunodeficiency due to drugs: Secondary | ICD-10-CM | POA: Diagnosis not present

## 2023-05-30 DIAGNOSIS — Z79899 Other long term (current) drug therapy: Secondary | ICD-10-CM | POA: Diagnosis not present

## 2023-06-19 DIAGNOSIS — D509 Iron deficiency anemia, unspecified: Secondary | ICD-10-CM | POA: Diagnosis not present

## 2023-08-24 DIAGNOSIS — R300 Dysuria: Secondary | ICD-10-CM | POA: Diagnosis not present

## 2023-08-24 DIAGNOSIS — T3695XA Adverse effect of unspecified systemic antibiotic, initial encounter: Secondary | ICD-10-CM | POA: Diagnosis not present

## 2023-08-24 DIAGNOSIS — B379 Candidiasis, unspecified: Secondary | ICD-10-CM | POA: Diagnosis not present

## 2023-09-20 DIAGNOSIS — D508 Other iron deficiency anemias: Secondary | ICD-10-CM | POA: Diagnosis not present

## 2023-09-20 DIAGNOSIS — D693 Immune thrombocytopenic purpura: Secondary | ICD-10-CM | POA: Diagnosis not present

## 2023-09-20 DIAGNOSIS — D509 Iron deficiency anemia, unspecified: Secondary | ICD-10-CM | POA: Diagnosis not present

## 2023-09-24 DIAGNOSIS — D693 Immune thrombocytopenic purpura: Secondary | ICD-10-CM | POA: Diagnosis not present

## 2023-10-17 DIAGNOSIS — D508 Other iron deficiency anemias: Secondary | ICD-10-CM | POA: Diagnosis not present

## 2023-10-17 DIAGNOSIS — D693 Immune thrombocytopenic purpura: Secondary | ICD-10-CM | POA: Diagnosis not present

## 2023-10-29 DIAGNOSIS — L400 Psoriasis vulgaris: Secondary | ICD-10-CM | POA: Diagnosis not present

## 2023-10-29 DIAGNOSIS — L700 Acne vulgaris: Secondary | ICD-10-CM | POA: Diagnosis not present

## 2023-11-12 DIAGNOSIS — D693 Immune thrombocytopenic purpura: Secondary | ICD-10-CM | POA: Diagnosis not present

## 2023-11-12 DIAGNOSIS — D508 Other iron deficiency anemias: Secondary | ICD-10-CM | POA: Diagnosis not present

## 2023-11-15 DIAGNOSIS — D696 Thrombocytopenia, unspecified: Secondary | ICD-10-CM | POA: Diagnosis not present

## 2023-11-15 DIAGNOSIS — D693 Immune thrombocytopenic purpura: Secondary | ICD-10-CM | POA: Diagnosis not present

## 2023-11-30 DIAGNOSIS — J069 Acute upper respiratory infection, unspecified: Secondary | ICD-10-CM | POA: Diagnosis not present

## 2023-12-06 DIAGNOSIS — Z79899 Other long term (current) drug therapy: Secondary | ICD-10-CM | POA: Diagnosis not present

## 2023-12-06 DIAGNOSIS — L409 Psoriasis, unspecified: Secondary | ICD-10-CM | POA: Diagnosis not present

## 2023-12-06 DIAGNOSIS — L4050 Arthropathic psoriasis, unspecified: Secondary | ICD-10-CM | POA: Diagnosis not present

## 2023-12-06 DIAGNOSIS — D84821 Immunodeficiency due to drugs: Secondary | ICD-10-CM | POA: Diagnosis not present

## 2023-12-18 DIAGNOSIS — D693 Immune thrombocytopenic purpura: Secondary | ICD-10-CM | POA: Diagnosis not present

## 2024-01-30 DIAGNOSIS — Z1231 Encounter for screening mammogram for malignant neoplasm of breast: Secondary | ICD-10-CM | POA: Diagnosis not present

## 2024-02-11 DIAGNOSIS — D693 Immune thrombocytopenic purpura: Secondary | ICD-10-CM | POA: Diagnosis not present

## 2024-03-10 DIAGNOSIS — D693 Immune thrombocytopenic purpura: Secondary | ICD-10-CM | POA: Diagnosis not present

## 2024-04-07 DIAGNOSIS — Z79899 Other long term (current) drug therapy: Secondary | ICD-10-CM | POA: Diagnosis not present

## 2024-04-07 DIAGNOSIS — L4050 Arthropathic psoriasis, unspecified: Secondary | ICD-10-CM | POA: Diagnosis not present

## 2024-04-07 DIAGNOSIS — L409 Psoriasis, unspecified: Secondary | ICD-10-CM | POA: Diagnosis not present

## 2024-04-07 DIAGNOSIS — D84821 Immunodeficiency due to drugs: Secondary | ICD-10-CM | POA: Diagnosis not present

## 2024-04-14 DIAGNOSIS — D693 Immune thrombocytopenic purpura: Secondary | ICD-10-CM | POA: Diagnosis not present

## 2024-05-05 DIAGNOSIS — L700 Acne vulgaris: Secondary | ICD-10-CM | POA: Diagnosis not present

## 2024-05-05 DIAGNOSIS — L400 Psoriasis vulgaris: Secondary | ICD-10-CM | POA: Diagnosis not present

## 2024-05-19 DIAGNOSIS — D693 Immune thrombocytopenic purpura: Secondary | ICD-10-CM | POA: Diagnosis not present

## 2024-06-16 DIAGNOSIS — D693 Immune thrombocytopenic purpura: Secondary | ICD-10-CM | POA: Diagnosis not present

## 2024-07-14 DIAGNOSIS — D693 Immune thrombocytopenic purpura: Secondary | ICD-10-CM | POA: Diagnosis not present

## 2024-07-31 DIAGNOSIS — Z124 Encounter for screening for malignant neoplasm of cervix: Secondary | ICD-10-CM | POA: Diagnosis not present

## 2024-07-31 DIAGNOSIS — N946 Dysmenorrhea, unspecified: Secondary | ICD-10-CM | POA: Diagnosis not present

## 2024-07-31 DIAGNOSIS — D259 Leiomyoma of uterus, unspecified: Secondary | ICD-10-CM | POA: Diagnosis not present

## 2024-07-31 DIAGNOSIS — N898 Other specified noninflammatory disorders of vagina: Secondary | ICD-10-CM | POA: Diagnosis not present

## 2024-07-31 DIAGNOSIS — Z01419 Encounter for gynecological examination (general) (routine) without abnormal findings: Secondary | ICD-10-CM | POA: Diagnosis not present

## 2024-07-31 DIAGNOSIS — N9489 Other specified conditions associated with female genital organs and menstrual cycle: Secondary | ICD-10-CM | POA: Diagnosis not present

## 2024-07-31 DIAGNOSIS — R350 Frequency of micturition: Secondary | ICD-10-CM | POA: Diagnosis not present

## 2024-08-12 DIAGNOSIS — L4050 Arthropathic psoriasis, unspecified: Secondary | ICD-10-CM | POA: Diagnosis not present

## 2024-08-12 DIAGNOSIS — Z79899 Other long term (current) drug therapy: Secondary | ICD-10-CM | POA: Diagnosis not present

## 2024-08-12 DIAGNOSIS — D84821 Immunodeficiency due to drugs: Secondary | ICD-10-CM | POA: Diagnosis not present

## 2024-08-12 DIAGNOSIS — L409 Psoriasis, unspecified: Secondary | ICD-10-CM | POA: Diagnosis not present

## 2024-08-18 DIAGNOSIS — D693 Immune thrombocytopenic purpura: Secondary | ICD-10-CM | POA: Diagnosis not present

## 2024-09-16 DIAGNOSIS — N946 Dysmenorrhea, unspecified: Secondary | ICD-10-CM | POA: Diagnosis not present

## 2024-10-01 DIAGNOSIS — D219 Benign neoplasm of connective and other soft tissue, unspecified: Secondary | ICD-10-CM | POA: Diagnosis not present

## 2024-10-01 DIAGNOSIS — D509 Iron deficiency anemia, unspecified: Secondary | ICD-10-CM | POA: Diagnosis not present

## 2024-10-01 DIAGNOSIS — Z9189 Other specified personal risk factors, not elsewhere classified: Secondary | ICD-10-CM | POA: Diagnosis not present

## 2024-10-01 DIAGNOSIS — D693 Immune thrombocytopenic purpura: Secondary | ICD-10-CM | POA: Diagnosis not present

## 2024-10-09 DIAGNOSIS — J069 Acute upper respiratory infection, unspecified: Secondary | ICD-10-CM | POA: Diagnosis not present

## 2024-10-09 DIAGNOSIS — R110 Nausea: Secondary | ICD-10-CM | POA: Diagnosis not present
